# Patient Record
Sex: Female | Born: 1955 | Race: White | Hispanic: No | Smoking: Never smoker
Health system: Southern US, Community
[De-identification: ages and names within clinical notes are randomized; demographics above are authoritative.]

## PROBLEM LIST (undated history)

## (undated) DIAGNOSIS — I820 Budd-Chiari syndrome: Secondary | ICD-10-CM

## (undated) DIAGNOSIS — E7800 Pure hypercholesterolemia, unspecified: Secondary | ICD-10-CM

## (undated) DIAGNOSIS — I10 Essential (primary) hypertension: Secondary | ICD-10-CM

## (undated) DIAGNOSIS — Z9289 Personal history of other medical treatment: Secondary | ICD-10-CM

## (undated) DIAGNOSIS — F419 Anxiety disorder, unspecified: Secondary | ICD-10-CM

## (undated) HISTORY — DX: Anxiety disorder, unspecified: F41.9

## (undated) HISTORY — PX: DILATION AND CURETTAGE OF UTERUS: SHX78

## (undated) HISTORY — DX: Budd-Chiari syndrome: I82.0

## (undated) HISTORY — DX: Personal history of other medical treatment: Z92.89

---

## 1997-08-31 ENCOUNTER — Ambulatory Visit (HOSPITAL_COMMUNITY): Admission: RE | Admit: 1997-08-31 | Discharge: 1997-08-31 | Payer: Self-pay | Admitting: Internal Medicine

## 1997-12-14 ENCOUNTER — Ambulatory Visit (HOSPITAL_COMMUNITY): Admission: RE | Admit: 1997-12-14 | Discharge: 1997-12-14 | Payer: Self-pay | Admitting: *Deleted

## 2006-04-10 ENCOUNTER — Ambulatory Visit: Payer: Self-pay | Admitting: Cardiology

## 2006-04-10 LAB — CONVERTED CEMR LAB
AST: 33 units/L (ref 0–37)
Albumin: 3.7 g/dL (ref 3.5–5.2)
CO2: 29 meq/L (ref 19–32)
Creatinine, Ser: 0.6 mg/dL (ref 0.4–1.2)
Glucose, Bld: 80 mg/dL (ref 70–99)
HDL: 49.8 mg/dL (ref >39.0)
Sodium: 138 meq/L (ref 135–145)
TSH: 2.28 microintl units/mL (ref 0.35–5.50)
Total Bilirubin: 1.2 mg/dL (ref 0.3–1.2)
Total CHOL/HDL Ratio: 4.9
Triglycerides: 133 mg/dL (ref 0–149)
VLDL: 27 mg/dL (ref 0–40)

## 2006-04-24 ENCOUNTER — Ambulatory Visit: Payer: Self-pay | Admitting: Cardiology

## 2006-04-30 ENCOUNTER — Ambulatory Visit (HOSPITAL_COMMUNITY): Admission: RE | Admit: 2006-04-30 | Discharge: 2006-04-30 | Payer: Self-pay | Admitting: Obstetrics and Gynecology

## 2006-04-30 ENCOUNTER — Encounter (INDEPENDENT_AMBULATORY_CARE_PROVIDER_SITE_OTHER): Payer: Self-pay | Admitting: *Deleted

## 2006-05-25 ENCOUNTER — Ambulatory Visit: Payer: Self-pay | Admitting: Cardiology

## 2006-05-25 LAB — CONVERTED CEMR LAB
Bilirubin, Direct: 0.2 mg/dL (ref 0.0–0.3)
CO2: 29 meq/L (ref 19–32)
Cholesterol: 144 mg/dL (ref 0–200)
GFR calc Af Amer: 135 mL/min
GFR calc non Af Amer: 112 mL/min
Glucose, Bld: 89 mg/dL (ref 70–99)
HDL: 47.9 mg/dL (ref >39.0)
LDL Cholesterol: 81 mg/dL (ref 0–99)
Sodium: 140 meq/L (ref 135–145)
Total CHOL/HDL Ratio: 3
Total Protein: 6.7 g/dL (ref 6.0–8.3)
Triglycerides: 75 mg/dL (ref 0–149)

## 2006-07-03 ENCOUNTER — Ambulatory Visit: Payer: Self-pay | Admitting: Cardiology

## 2007-01-01 ENCOUNTER — Ambulatory Visit: Payer: Self-pay | Admitting: Cardiology

## 2007-02-09 ENCOUNTER — Ambulatory Visit: Payer: Self-pay | Admitting: Cardiology

## 2007-08-11 ENCOUNTER — Ambulatory Visit: Payer: Self-pay | Admitting: Cardiology

## 2007-11-22 ENCOUNTER — Emergency Department (HOSPITAL_COMMUNITY): Admission: EM | Admit: 2007-11-22 | Discharge: 2007-11-22 | Payer: Self-pay | Admitting: Family Medicine

## 2008-07-14 ENCOUNTER — Emergency Department (HOSPITAL_COMMUNITY): Admission: EM | Admit: 2008-07-14 | Discharge: 2008-07-14 | Payer: Self-pay | Admitting: Family Medicine

## 2008-08-12 DIAGNOSIS — E7800 Pure hypercholesterolemia, unspecified: Secondary | ICD-10-CM | POA: Insufficient documentation

## 2008-08-12 DIAGNOSIS — E669 Obesity, unspecified: Secondary | ICD-10-CM | POA: Insufficient documentation

## 2008-08-12 DIAGNOSIS — I10 Essential (primary) hypertension: Secondary | ICD-10-CM | POA: Insufficient documentation

## 2008-08-12 DIAGNOSIS — E785 Hyperlipidemia, unspecified: Secondary | ICD-10-CM | POA: Insufficient documentation

## 2010-07-30 NOTE — Assessment & Plan Note (Signed)
[facility] HEALTHCARE                            CARDIOLOGY OFFICE NOTE   CHANTIL, BARI                        MRN:          [id number]  DATE:08/11/2007                            DOB:          [birth year]    HISTORY OF PRESENT ILLNESS:  Ms. Sarkisyan returns today for follow-up  concerning:  1. Hypertension.  2. Mixed hyperlipidemia.  3. Obesity.   Her biggest complaint today is that she has not lost weight though she  has lost inches.  She has lost 2-3 sizes in her clothes.  She is working  out 3+ hours swimming and doing water aerobics at the [employer].  Her daughter actually directs aquatics there.   REVIEW OF SYSTEMS:  She has no angina.  No ischemic equivalents.  No dyspnea on exertion.  She has had no orthopnea, PND or peripheral edema.   MEDICATIONS:  1. Simvastatin 40 mg p.o. q.h.s. with her insurance plan.  2. Amlodipine/benazepril 5/20 mg daily.  3. Vitamin daily.   PHYSICAL EXAMINATION:  VITAL SIGNS:  Her weight is increased from 210 to  216 six months ago.  Her blood pressure is 122/80, her pulse is 68 and  regular.  HEENT:  Unchanged.  NECK:  Carotid upstrokes are equal bilaterally without bruits.  No JVD.  Thyroid is not enlarged.  Trachea is midline.  LUNGS:  Clear.  HEART:  Reveals a nondisplaced but poorly appreciated PMI.  Normal S1-  S2.  ABDOMEN:  Soft, good bowel sounds.  No obvious organomegaly.  EXTREMITIES:  No sinus clubbing or edema.  Pulses are brisk.  NEUROLOGICAL:  Exam is intact.   ASSESSMENT:  Ms. Villalona is doing well with her exercise.  Though her  weight is up, I suspect this is muscle mass.  She is down 2-3 sizes in  her clothes.  She is due lipids which we will arrange along with a  comprehensive metabolic panel.  Assuming these are stable, I will see  her back in 6 months.     Thomas C. Daleen Squibb, MD, [facility]  Electronically Signed   TCW/MedQ  DD: 08/11/2007  DT: 08/11/2007  Job #: [id number]   cc:   Juluis Mire, M.D.

## 2010-07-30 NOTE — Assessment & Plan Note (Signed)
[facility] HEALTHCARE                            CARDIOLOGY OFFICE NOTE   LARENDA, REEDY                          MRN:          [id number]  DATE:01/01/2007                            DOB:          03-14-1956    Ms. Graham returns today for further management of her hypertension,  obesity and hyperlipidemia.   Her daughter who she says is difficult has moved back in with her from  college.  Her lifestyle has changed including sharing the car.  She is  no longer going to [employer].  Her weight is back up to 213 from  204.  She is taking her medications and her blood pressure has been  good.   MEDICATIONS:  1. Vytorin 10/40 daily.  2. Amlodipine 5/benazepril 20 mg a day.   Her blood pressure is 118/70, her pulse 58 and regular.  EKG is normal  except for sinus brady.  Her weight is 213.  HEENT:  Unchanged.  Carotid upstrokes were equal bilaterally without  bruits, no JVD, thyroid is not enlarged, trachea is midline.  LUNGS:  Clear.  HEART:  Reveals a regular rate and rhythm.  ABDOMINAL:  Soft, good bowel sound, no midline bruit.  EXTREMITIES:  Reveal no edema, pulses are intact.   Ann Graham unfortunately has slipped with her weight loss program.  Her  blood pressure is still under good control.  Her lipids were at goal  May 25, 2006.   I have reinforced the fact that she needs to get back into a walking  program and to her diet program.  She will continue with her current  medications.  We will see her back in 6 months.     Thomas C. Daleen Squibb, MD, [facility]  Electronically Signed    TCW/MedQ  DD: 01/01/2007  DT: 01/02/2007  Job #: [id number]   cc:   Juluis Mire, Ann.D.

## 2010-07-30 NOTE — Assessment & Plan Note (Signed)
[facility] HEALTHCARE                            CARDIOLOGY OFFICE NOTE   Ann Graham, Ann Graham                          MRN:          [id number]  DATE:02/09/2007                            DOB:          03/15/1956    Ann Graham returns today for further management of the following issues:  1. Hypertension.  2. Mixed hyperlipidemia with an excellent response to Vytorin.  3. Obesity.   Unfortunately, her brother just died 2 weeks ago in [place].  Apparently there was some unresolved issues with him, she is very  emotional today.  Her daughter is also still living with her and sharing  her vehicle.  Her daughter called her twice on her cell phone while she  was in the office.  Her stress levels were obviously showing.   She is not in [employer].  She has lost 3 pounds.   MEDICATIONS:  1. Vytorin 10/40 daily.  2. Amlodipine 5 mg/benazepril 20 mg a day.   Her blood pressure is 125/77, pulse is 65 and regular, weight is 210.  HEENT:  Unchanged.  Carotid upstrokes were equal bilaterally without  bruits, no JVD, thyroid is not enlarged, trachea is midline.  LUNGS:  Clear.  HEART:  Reveals a nondisplaced PMI, normal S1-S2.  ABDOMINAL:  Soft, good bowel sounds.  EXTREMITIES:  Reveal 1+ edema, pulses are intact.  She has some early  petechiae and some early brawny changes to her skin.   We spent a lot of time talking about her lower extremity edema and the  longterm consequences of this, she thought she had cellulitis last week.  She obviously does not have cellulitis today.  I forewarned her that  with her weight and lack of activity these changes are going to get  worse.  I offered her a diuretic if her edema starts to become a real  problem.   I will plan on seeing her back March.  At that time she will be due  lipids and LFTs.     Thomas C. Daleen Squibb, MD, [facility]  Electronically Signed    TCW/MedQ  DD: 02/09/2007  DT: 02/09/2007  Job #: [id number]   cc:   Juluis Mire, M.D.

## 2010-08-02 NOTE — Assessment & Plan Note (Signed)
[facility] HEALTHCARE                            CARDIOLOGY OFFICE NOTE   [name], [name]                          MRN:          [id number]  DATE:04/10/2006                            DOB:          [birth year]    I was asked by Dr. Arelia Sneddon to see Ann Graham, a delightful 55 year old,  separated white female with multiple cardiac risk factors, including  severe hyperlipidemia and hypertension, as well as family history.   She has been to this office before, having had a stress Cardiolite,  February 21, 1999.  This was normal.  Her blood pressure was quite high  with exercise at 184 systolic.   She is totally asymptomatic, other than some mild dyspnea on exertion.   Her risk factors include hyperlipidemia with a total cholesterol greater  than 300 (I do not have any numbers), premature family history in her  father and in her brother and her mother, hypertension, obesity,  sedentary lifestyle.   She is separated and under a lot of stress.  She works full-time at [employer].  She is on her feet eight and a half hours a day and,  therefore, does not do any exercise.   PAST MEDICAL HISTORY:   ALLERGIES:  She is intolerant of HYDROCODONE.  She has no dye allergies.   CURRENT MEDICATIONS:  CQ10 vitamin, B complex and omega 3.   She does not smoke, does not use any recreational drugs, does not drink  any alcohol.  She drinks two cups of caffeinated beverages a day.   SURGICAL HISTORY:  None.   FAMILY HISTORY:  Mother died of a heart attack at age 49.  Her brother  is in his fifties and has had coronary bypass grafting.  High  cholesterol runs in the family.   SOCIAL HISTORY:  She is separated.  She has two children in college.  She is on Weight Watchers and lost 11 pounds recently.  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   REVIEW OF SYSTEMS:  Other than the HPI, is negative, except for  menstrual dysfunction.  All  systems were checked and questioned.   EXAM:  Her blood pressure is 140/90, her pulse is 51 and regular.  Her  EKG shows sinus brady with possible left atrial enlargement, otherwise  normal.  She is 5 feet 5, weighs 211.  HEENT:  Normocephalic, atraumatic.  PERRLA.  Extraocular movements  intact.  Sclerae are clear.  Facial symmetry is normal.  Dentition is  satisfactory.  Carotid upstrokes are equal bilaterally, without bruits.  No JVD.  Thyroid is not enlarged.  Trachea is midline.  The neck is  supple.  There is no lymphadenopathy.  LUNGS:  Clear.  HEART:  Reveals a nondisplaced PMI.  She has normal S1, S2.  ABDOMINAL EXAM:  Soft, good bowel sounds.  There is no organomegaly, but  difficult to assess.  EXTREMITIES:  Reveal no edema.  Pulses are intact.  NEUROLOGIC EXAM:  Intact.  SKIN:  Normal.  MUSCULOSKELETAL:  Unremarkable.   ASSESSMENT AND PLAN:  [name] has a number of cardiovascular risk  factors and, therefore, I think is at least moderate high risk of having  a premature coronary event.  This also pertains to her possible risk of  stroke.   We have spoken for 20-30 minutes today.  We need fasting blood work,  which I will obtain today.  We will check lipids, comprehensive  metabolic panel, a TSH.  In addition, we need to also talk about  checking her blood pressure when she returns.   PLAN:  1. Check blood work today.  2. Return in a week to ten days to discuss pharmacological therapy for      both her blood pressure and her hyperlipidemia.     Thomas C. Daleen Squibb, MD, [facility]  Electronically Signed    TCW/MedQ  DD: 04/10/2006  DT: 04/10/2006  Job #: [id number]   cc:   Juluis Mire, M.D.

## 2010-08-02 NOTE — Op Note (Signed)
NAME:  Ann Graham, Ann Graham NO.:  [id number]   MEDICAL RECORD NO.:  [id number]          PATIENT TYPE:  AMB   LOCATION:  SDC                           FACILITY:  [facility]   PHYSICIAN:  Juluis Mire, M.D.   DATE OF BIRTH:  11-04-1955   DATE OF PROCEDURE:  04/30/2006  DATE OF DISCHARGE:                               OPERATIVE REPORT   POSTOPERATIVE DIAGNOSIS:  Endometrial polyp.   POSTOPERATIVE DIAGNOSIS:  Endometrial polyp.   PROCEDURES:  1. Hysteroscopy.  2. Resection of polyp.  3. Multiple endometrial biopsies and endometrial curettings.   SURGEON:  Juluis Mire, M.D.   ANESTHESIA:  General.   ESTIMATED BLOOD LOSS:  Minimal.   PACKS AND DRAINS:  None.   INTRAOPERATIVE BLOOD PLACED:  None.   COMPLICATIONS:  None.   INDICATIONS:  As dictated in history and physical.   DESCRIPTION OF PROCEDURE:  Patient was taken to the OR, placed in supine  position.  After satisfactory level of general anesthesia was obtained,  the patient was placed in dorsal lithotomy position using the Allen  stirrups.  Perineum and vagina prepped out with Betadine and draped as a  sterile field.  Speculum was placed in the vaginal vault.  The cervix  was grasped with single-tooth tenaculum.  Paracervical block was  instituted using 2% Nesacaine.  Uterus sounded to 8 cm.  Cervix serially  dilated to a size 35 Pratt dilator.  Operative hysteroscope was  introduced and intrauterine cavity was distended using sorbitol.  Visualization revealed a polyp on the right lateral uterine wall.  This  was resected and sent for pathological review.  We then did subsequent  multiple biopsies as well as curettings.  There were no signs of  perforation, deficit was minimal, bleeding was minimal.  At this point  in time, the speculum  and single-tooth tenaculum then removed.  The patient taken out of the  dorsal lithotomy position.  Once alert, transferred to recovery room in  good condition.  Sponges,  instrument and needle count was reported as  correct by the circulating nurse x2.      Juluis Mire, M.D.  Electronically Signed     JSM/MEDQ  D:  04/30/2006  T:  04/30/2006  Job:  [id number]

## 2010-08-02 NOTE — H&P (Signed)
NAME:  Ann Graham, AFZAL NO.:  [id number]   MEDICAL RECORD NO.:  [id number]          PATIENT TYPE:  AMB   LOCATION:  SDC                           [facility]ACILITY:  [facility]   PHYSICIAN:  Juluis Mire, M.D.   DATE OF BIRTH:  [birth year]   DATE OF ADMISSION:  04/30/2006  DATE OF DISCHARGE:                              HISTORY & PHYSICAL   HISTORY OF PRESENT ILLNESS:  This 55 year old gravida 2, para 2 female  presents for hysteroscopy.   In relation to the present admission, the patient has had a history of  oligomenorrhea.  She underwent a saline infusion ultrasound that  revealed an endometrial polyp and thickening; she now presents for  hysteroscopic evaluation to rule out any type of endometrial process  such as hyperplasia.   ALLERGIES:  She allergic to HYDROCODONE.   MEDICATIONS:  None.   PAST MEDICAL HISTORY:  Usual childhood disease without any significant  sequelae.   SURGICAL HISTORY:  None.   OBSTETRICAL HISTORY:  Two vaginal deliveries.   FAMILY HISTORY:  Noncontributory.   SOCIAL HISTORY:  No tobacco or alcohol use.   REVIEW OF SYSTEMS:  Noncontributory.   PHYSICAL EXAM:  GENERAL:  The patient is afebrile with stable vital  signs.  HEENT: The patient is normocephalic.  Pupils are equal, round and  reactive to light and accommodation.  Extraocular were intact.  Sclerae  and conjunctivae were clear.  Oropharynx clear.  NECK:  Without thyromegaly.  BREASTS:  Not examined.  LUNGS:  Clear.  CARDIOVASCULAR:  Regular rhythm and rate without murmurs, rubs or  gallops.  ABDOMEN:  Exam is benign.  PELVIC: Normal external genitalia.  Vaginal mucosa is clear.  Cervix  remarkable.  Uterus normal size, shape and contour.  Adnexa free of  masses or tenderness.  EXTREMITIES:  Trace edema.  NEUROLOGIC:  Exam grossly within normal limits.   IMPRESSION:  Abnormal uterine bleeding with endometrial polyp.   PLAN:  The patient will undergo hysteroscopy with  resectoscope. Risks of  surgery have been discussed including the risk of infection, the risk of  hemorrhage that could require transfusion or possible hysterectomy, risk  of injury to adjacent organs including bladder, bowel or ureters that  could require further exploratory surgery, risk of deep venous  thrombosis and pulmonary embolus.  The patient expressed understanding  of indications and risks.      Juluis Mire, M.D.  Electronically Signed     JSM/MEDQ  D:  04/30/2006  T:  04/30/2006  Job:  [id number]

## 2010-08-02 NOTE — Assessment & Plan Note (Signed)
[facility] HEALTHCARE                            CARDIOLOGY OFFICE NOTE   Ann, Graham                          MRN:          [id number]  DATE:07/03/2006                            DOB:          [birth year]    Ann Graham returns today to discuss the findings of her blood work and  follow up on her blood pressure.   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   On Vytorin 10/40 her total cholesterol dropped from 246 to 144,  triglycerides are down to 75, HDL stays about the same at 48, LDL is 81  down from 176, LFTs are normal, potassium and electrolytes were normal.   Her blood pressure on amlodipine, Benazepril 5/20 has been remarkably  good; it is 128/70.  She is watching her salt as well.   The rest of her exam is unchanged.   I have spent 20 minutes reinforcing to Ann Graham how well she is doing.  We will stay with the same program.  I will plan on seeing her back  again in 6 months.     Thomas C. Daleen Squibb, MD, [facility]  Electronically Signed    TCW/MedQ  DD: 07/03/2006  DT: 07/04/2006  Job #: [id number]   cc:   Juluis Mire, M.D.

## 2010-08-02 NOTE — Assessment & Plan Note (Signed)
[facility] HEALTHCARE                            CARDIOLOGY OFFICE NOTE   SHARNE, LINDERS                          MRN:          [id number]  DATE:04/24/2006                            DOB:          Sep 12, 1955    [name] returns today to discuss the findings of her blood work, as  well as follow up on her blood pressure.   Unfortunately, despite losing 6 pounds this past week, her blood  pressure is still elevated at 174/87.  Her pulse is 66 and regular.   Her laboratory data showed a potassium of 3.4, though she is not on any  diuretics.  The rest of her chemistry profile was normal.  Her total  cholesterol is 246, triglycerides 133, HDL 49.8, direct LDL is 176, TSH  was normal.   ASSESSMENT AND PLAN:  I have had a long talk with Ann Graham today.  With  her family history, fairly severe hyperlipidemia, and hypertension, we  need to be very aggressive with pharmacological therapy, as well as with  therapeutic lifestyle changes.   PLAN:  1. Begin exercise program three hours per week.  2. Continue to lose weight, about 4 pounds per month.  3. Begin Vytorin 10/40 with followup blood work in 6 weeks.  4. Begin amlodipine 5/benazepril 20 q.a.m.  5. Increase potassium-rich foods in her diet.   I will plan on seeing her back in about 8 weeks.     Thomas C. Daleen Squibb, MD, [facility]  Electronically Signed    TCW/MedQ  DD: 04/24/2006  DT: 04/24/2006  Job #: [id number]   cc:   Ann Graham, M.D.

## 2010-11-29 ENCOUNTER — Other Ambulatory Visit: Payer: Self-pay | Admitting: Surgery

## 2010-12-20 DIAGNOSIS — H4710 Unspecified papilledema: Secondary | ICD-10-CM | POA: Insufficient documentation

## 2011-02-10 DIAGNOSIS — G950 Syringomyelia and syringobulbia: Secondary | ICD-10-CM | POA: Insufficient documentation

## 2011-02-20 DIAGNOSIS — IMO0002 Reserved for concepts with insufficient information to code with codable children: Secondary | ICD-10-CM | POA: Insufficient documentation

## 2011-02-20 DIAGNOSIS — E236 Other disorders of pituitary gland: Secondary | ICD-10-CM | POA: Insufficient documentation

## 2011-09-03 DIAGNOSIS — G932 Benign intracranial hypertension: Secondary | ICD-10-CM | POA: Insufficient documentation

## 2012-06-12 ENCOUNTER — Inpatient Hospital Stay (HOSPITAL_COMMUNITY)
Admission: EM | Admit: 2012-06-12 | Discharge: 2012-06-14 | DRG: 143 | Disposition: A | Discharge status: Home / Self Care | Payer: BC Managed Care – PPO | Attending: Family Medicine | Admitting: Family Medicine

## 2012-06-12 ENCOUNTER — Encounter (HOSPITAL_COMMUNITY): Payer: Self-pay | Admitting: Emergency Medicine

## 2012-06-12 ENCOUNTER — Emergency Department (HOSPITAL_COMMUNITY): Payer: BC Managed Care – PPO

## 2012-06-12 DIAGNOSIS — E876 Hypokalemia: Secondary | ICD-10-CM

## 2012-06-12 DIAGNOSIS — I517 Cardiomegaly: Secondary | ICD-10-CM

## 2012-06-12 DIAGNOSIS — Z7982 Long term (current) use of aspirin: Secondary | ICD-10-CM

## 2012-06-12 DIAGNOSIS — Z79899 Other long term (current) drug therapy: Secondary | ICD-10-CM

## 2012-06-12 DIAGNOSIS — J811 Chronic pulmonary edema: Secondary | ICD-10-CM | POA: Diagnosis present

## 2012-06-12 DIAGNOSIS — I10 Essential (primary) hypertension: Secondary | ICD-10-CM

## 2012-06-12 DIAGNOSIS — I5189 Other ill-defined heart diseases: Secondary | ICD-10-CM

## 2012-06-12 DIAGNOSIS — E785 Hyperlipidemia, unspecified: Secondary | ICD-10-CM

## 2012-06-12 DIAGNOSIS — E669 Obesity, unspecified: Secondary | ICD-10-CM

## 2012-06-12 DIAGNOSIS — E7800 Pure hypercholesterolemia, unspecified: Secondary | ICD-10-CM | POA: Diagnosis present

## 2012-06-12 DIAGNOSIS — E875 Hyperkalemia: Secondary | ICD-10-CM | POA: Diagnosis present

## 2012-06-12 DIAGNOSIS — R079 Chest pain, unspecified: Secondary | ICD-10-CM

## 2012-06-12 HISTORY — DX: Essential (primary) hypertension: I10

## 2012-06-12 HISTORY — DX: Pure hypercholesterolemia, unspecified: E78.00

## 2012-06-12 LAB — CBC WITH DIFFERENTIAL/PLATELET
Basophils Absolute: 0 K/uL (ref 0.0–0.1)
Basophils Relative: 0 % (ref 0–1)
Eosinophils Absolute: 0.1 K/uL (ref 0.0–0.7)
Eosinophils Relative: 2 % (ref 0–5)
HCT: 37.2 % (ref 36.0–46.0)
Hemoglobin: 13.1 g/dL (ref 12.0–15.0)
Lymphocytes Relative: 20 % (ref 12–46)
Lymphs Abs: 1.9 K/uL (ref 0.7–4.0)
MCH: 28.8 pg (ref 26.0–34.0)
MCHC: 35.2 g/dL (ref 30.0–36.0)
MCV: 81.8 fL (ref 78.0–100.0)
Monocytes Absolute: 0.7 K/uL (ref 0.1–1.0)
Monocytes Relative: 7 % (ref 3–12)
Neutro Abs: 6.6 K/uL (ref 1.7–7.7)
Neutrophils Relative %: 71 % (ref 43–77)
Platelets: 238 K/uL (ref 150–400)
RBC: 4.55 MIL/uL (ref 3.87–5.11)
RDW: 13.6 % (ref 11.5–15.5)
WBC: 9.3 K/uL (ref 4.0–10.5)

## 2012-06-12 LAB — BASIC METABOLIC PANEL WITH GFR
BUN: 22 mg/dL (ref 6–23)
CO2: 26 meq/L (ref 19–32)
Calcium: 9.4 mg/dL (ref 8.4–10.5)
Chloride: 94 meq/L — ABNORMAL LOW (ref 96–112)
Creatinine, Ser: 0.66 mg/dL (ref 0.50–1.10)
GFR calc Af Amer: >90 mL/min
GFR calc non Af Amer: >90 mL/min
Glucose, Bld: 114 mg/dL — ABNORMAL HIGH (ref 70–99)
Potassium: 3.1 meq/L — ABNORMAL LOW (ref 3.5–5.1)
Sodium: 132 meq/L — ABNORMAL LOW (ref 135–145)

## 2012-06-12 LAB — TROPONIN I: Troponin I: <0.3 ng/mL

## 2012-06-12 NOTE — ED Provider Notes (Signed)
History     CSN: [id number]  Arrival date & time 06/12/12  2133   First MD Initiated Contact with Patient 06/12/12 2308      Chief Complaint  Patient presents with  . Chest Pain    (Consider location/radiation/quality/duration/timing/severity/associated sxs/prior treatment) HPI 57 year old female presents emergency apartment via EMS with complaint of chest pressure.  Patient reports pain.  This evening when she was laying down to sleep.  Pain was sharp, described as heavy.  She had some pain yesterday, but much worse tonight.  She denies any shortness of breath, fever, cough.  Patient has history of hypertension, and hyperlipidemia.  She reports family history of coronary disease.  Patient was recently put on Lasix by the nurse practitioner at her doctor's office 2 to lower extremity swelling.  She, reports she's had edema in her lower extremity for some time, worse of the last week.  Patient has received 324 mg of aspirin prior to arrival.  EMS gave patient a nitroglycerin, which dropped her blood pressure precipitously.  Patient denies any pain at present.  Portable chest x-ray with concerns for possible pericardial effusion.  She denies any previous history of pericardial effusion.  She denies any recent illnesses. No history of cancer.  Past Medical History  Diagnosis Date  . Hypertension   . Elevated cholesterol     No past surgical history on file.  No family history on file.  History  Substance Use Topics  . Smoking status: Not on file  . Smokeless tobacco: Not on file  . Alcohol Use: Not on file    OB History   No data available      Review of Systems  See History of Present Illness; otherwise all other systems are reviewed and negative  Allergies  Hydrocodone  Home Medications   Current Outpatient Rx  Name  Route  Sig  Dispense  Refill  . aspirin 325 MG tablet   Oral   Take 325 mg by mouth once.         . Cyanocobalamin (VITAMIN B-12) 500 MCG SUBL  Sublingual   Place 500 mcg under the tongue daily.         . furosemide (LASIX) 40 MG tablet   Oral   Take 20 mg by mouth daily as needed (for fluid retention).         . traZODone (DESYREL) 50 MG tablet   Oral   Take 50 mg by mouth at bedtime.         . triamterene-hydrochlorothiazide (MAXZIDE-25) 37.5-25 MG per tablet   Oral   Take 1 tablet by mouth daily.           BP 106/64  Pulse 73  Temp(Src) 98.1 F (36.7 C) (Oral)  Resp 13  SpO2 98%  Physical Exam  Nursing note and vitals reviewed. Constitutional: She is oriented to person, place, and time. She appears well-developed and well-nourished.  HENT:  Head: Normocephalic and atraumatic.  Nose: Nose normal.  Mouth/Throat: Oropharynx is clear and moist.  Eyes: Conjunctivae and EOM are normal. Pupils are equal, round, and reactive to light.  Neck: Normal range of motion. Neck supple. No JVD present. No tracheal deviation present. No thyromegaly present.  Cardiovascular: Normal rate, regular rhythm, normal heart sounds and intact distal pulses.  Exam reveals no gallop and no friction rub.   No murmur heard. Pulmonary/Chest: Effort normal and breath sounds normal. No stridor. No respiratory distress. She has no wheezes. She has no rales. She  exhibits no tenderness.  Abdominal: Soft. Bowel sounds are normal. She exhibits no distension and no mass. There is no tenderness. There is no rebound and no guarding.  Musculoskeletal: Normal range of motion. She exhibits no edema and no tenderness.  Lymphadenopathy:    She has no cervical adenopathy.  Neurological: She is alert and oriented to person, place, and time. She exhibits normal muscle tone. Coordination normal.  Skin: Skin is warm and dry. No rash noted. No erythema. No pallor.  Psychiatric: She has a normal mood and affect. Her behavior is normal. Judgment and thought content normal.    ED Course  Procedures (including critical care time)  Labs Reviewed  BASIC  METABOLIC PANEL - Abnormal; Notable for the following:    Sodium 132 (*)    Potassium 3.1 (*)    Chloride 94 (*)    Glucose, Bld 114 (*)    All other components within normal limits  CBC WITH DIFFERENTIAL  TROPONIN I   Dg Chest 2 View  06/12/2012  *RADIOLOGY REPORT*  Clinical Data: Chest pain, shortness of breath, history hypertension  CHEST - 2 VIEW  Comparison: Prior portable exam of 06/12/2012  Findings: Mild enlargement of cardiac silhouette. Mediastinal contours and pulmonary vascularity normal. Minimal atelectasis at right base. Lungs otherwise clear. No pleural effusion or pneumothorax. Bones unremarkable.  IMPRESSION: Mild enlargement of cardiac silhouette. Minimal right basilar atelectasis.   Original Report Authenticated By: Ulyses Southward, M.D.    Dg Chest Port 1 View  06/12/2012  *RADIOLOGY REPORT*  Clinical Data: Chest pain and shortness of breath.  PORTABLE CHEST - 1 VIEW  Comparison: No priors.  Findings: Lung volumes are slightly low.  No consolidative airspace disease.  No pleural effusions.  Pulmonary vasculature is within normal limits.  Enlargement of the cardiopericardial silhouette has a "water bottle" appearance, which could suggest the presence of a pericardial effusion.  Upper mediastinal contours are slightly distorted by patient rotation to the right.  IMPRESSION: 1.  Enlargement of the cardiopericardial silhouette with a "water bottle" appearance, which could suggest the presence of a pericardial effusion.  Clinical correlation is recommended.   Original Report Authenticated By: Trudie Reed, M.D.      1. Chest pain   2. Hypokalemia   3. Unspecified essential hypertension   4. Cardiomegaly       MDM  57 year old female with chest pressure, chest x-ray concerning for possible pericardial effusion.  We'll get 2 view chest for better evaluation, may need echo and/or CT chest.  Patient is been seen in the remote past by cardiology.  Given her risk factors, so she will  need a chest pain, rule out admission.        Olivia Mackie, MD 06/13/12 [phone]

## 2012-06-12 NOTE — ED Notes (Signed)
[facility] presents with a 57 yo female temporarily residing at hotel with chest pain.  Pt rates pain 8/10.  Pt was in an irregular SR and was given NTG by [facility]/HR went from 82 to 50 with a BP of 80/40.  [facility] laid pt down and bolused 100 ml of NS and BP returned to 110/60 and HR remained stable at 74.Pt recently put on furosemide and trazadone.  Pt received 324 ASA at residence.

## 2012-06-13 ENCOUNTER — Encounter (HOSPITAL_COMMUNITY): Payer: Self-pay | Admitting: Internal Medicine

## 2012-06-13 DIAGNOSIS — E876 Hypokalemia: Secondary | ICD-10-CM | POA: Diagnosis present

## 2012-06-13 DIAGNOSIS — I517 Cardiomegaly: Secondary | ICD-10-CM | POA: Diagnosis present

## 2012-06-13 DIAGNOSIS — I10 Essential (primary) hypertension: Secondary | ICD-10-CM

## 2012-06-13 DIAGNOSIS — I519 Heart disease, unspecified: Secondary | ICD-10-CM

## 2012-06-13 DIAGNOSIS — R079 Chest pain, unspecified: Secondary | ICD-10-CM | POA: Diagnosis present

## 2012-06-13 LAB — CBC
HCT: 38.2 % (ref 36.0–46.0)
MCV: 81.8 fL (ref 78.0–100.0)
Platelets: 230 10*3/uL (ref 150–400)
RBC: 4.67 MIL/uL (ref 3.87–5.11)
WBC: 7.6 10*3/uL (ref 4.0–10.5)

## 2012-06-13 LAB — TROPONIN I
Troponin I: <0.3 ng/mL (ref <0.30)
Troponin I: <0.3 ng/mL (ref <0.30)

## 2012-06-13 LAB — COMPREHENSIVE METABOLIC PANEL
Albumin: 3.6 g/dL (ref 3.5–5.2)
BUN: 18 mg/dL (ref 6–23)
Calcium: 9.5 mg/dL (ref 8.4–10.5)
Creatinine, Ser: 0.56 mg/dL (ref 0.50–1.10)
GFR calc Af Amer: >90 mL/min (ref >90)
Total Protein: 7.1 g/dL (ref 6.0–8.3)

## 2012-06-13 LAB — LIPID PANEL
LDL Cholesterol: 187 mg/dL — ABNORMAL HIGH (ref 0–99)
Total CHOL/HDL Ratio: 5.8 RATIO
VLDL: 40 mg/dL (ref 0–40)

## 2012-06-13 LAB — PHOSPHORUS: Phosphorus: 4.5 mg/dL (ref 2.3–4.6)

## 2012-06-13 LAB — MAGNESIUM: Magnesium: 2.4 mg/dL (ref 1.5–2.5)

## 2012-06-13 LAB — HEMOGLOBIN A1C: Hgb A1c MFr Bld: 5.7 % — ABNORMAL HIGH (ref <5.7)

## 2012-06-13 MED ORDER — POTASSIUM CHLORIDE CRYS ER 20 MEQ PO TBCR
EXTENDED_RELEASE_TABLET | ORAL | Status: AC
  Filled 2012-06-13: qty 2

## 2012-06-13 MED ORDER — FUROSEMIDE 20 MG PO TABS
20.0000 mg | ORAL_TABLET | Freq: Every day | ORAL | Status: DC
  Administered 2012-06-13 – 2012-06-14 (×2): 20 mg via ORAL
  Filled 2012-06-13 (×2): qty 1

## 2012-06-13 MED ORDER — ACTIVE PARTNERSHIP FOR HEALTH OF YOUR HEART BOOK
Freq: Once | Status: AC
  Administered 2012-06-13: 22:00:00
  Filled 2012-06-13 (×2): qty 1

## 2012-06-13 MED ORDER — DOCUSATE SODIUM 100 MG PO CAPS
100.0000 mg | ORAL_CAPSULE | Freq: Two times a day (BID) | ORAL | Status: DC
  Administered 2012-06-14: 100 mg via ORAL
  Filled 2012-06-13 (×2): qty 1

## 2012-06-13 MED ORDER — ONDANSETRON HCL 4 MG PO TABS: 4.0000 mg | ORAL_TABLET | Freq: Four times a day (QID) | ORAL | Status: DC | PRN

## 2012-06-13 MED ORDER — SODIUM CHLORIDE 0.9 % IJ SOLN
3.0000 mL | Freq: Two times a day (BID) | INTRAMUSCULAR | Status: DC
  Administered 2012-06-13 – 2012-06-14 (×2): 3 mL via INTRAVENOUS

## 2012-06-13 MED ORDER — POTASSIUM CHLORIDE 10 MEQ/100ML IV SOLN
10.0000 meq | INTRAVENOUS | Status: DC
  Administered 2012-06-13: 10 meq via INTRAVENOUS
  Filled 2012-06-13 (×2): qty 100

## 2012-06-13 MED ORDER — MORPHINE SULFATE 2 MG/ML IJ SOLN: 2.0000 mg | INTRAMUSCULAR | Status: DC | PRN

## 2012-06-13 MED ORDER — POTASSIUM CHLORIDE CRYS ER 20 MEQ PO TBCR
40.0000 meq | EXTENDED_RELEASE_TABLET | Freq: Once | ORAL | Status: AC
  Administered 2012-06-13: 40 meq via ORAL
  Filled 2012-06-13: qty 2

## 2012-06-13 MED ORDER — ATORVASTATIN CALCIUM 20 MG PO TABS
20.0000 mg | ORAL_TABLET | Freq: Every day | ORAL | Status: DC
  Administered 2012-06-13: 20 mg via ORAL
  Filled 2012-06-13 (×2): qty 1

## 2012-06-13 MED ORDER — ACETAMINOPHEN 325 MG PO TABS: 650.0000 mg | ORAL_TABLET | Freq: Four times a day (QID) | ORAL | Status: DC | PRN

## 2012-06-13 MED ORDER — ACETAMINOPHEN 650 MG RE SUPP: 650.0000 mg | Freq: Four times a day (QID) | RECTAL | Status: DC | PRN

## 2012-06-13 MED ORDER — ONDANSETRON HCL 4 MG/2ML IJ SOLN: 4.0000 mg | Freq: Four times a day (QID) | INTRAMUSCULAR | Status: DC | PRN

## 2012-06-13 MED ORDER — ASPIRIN EC 81 MG PO TBEC
81.0000 mg | DELAYED_RELEASE_TABLET | Freq: Every day | ORAL | Status: DC
  Administered 2012-06-13 – 2012-06-14 (×2): 81 mg via ORAL
  Filled 2012-06-13 (×2): qty 1

## 2012-06-13 MED ORDER — TRAZODONE HCL 50 MG PO TABS
50.0000 mg | ORAL_TABLET | Freq: Every day | ORAL | Status: DC
  Administered 2012-06-13: 50 mg via ORAL
  Filled 2012-06-13 (×2): qty 1

## 2012-06-13 MED ORDER — POTASSIUM CHLORIDE CRYS ER 20 MEQ PO TBCR
40.0000 meq | EXTENDED_RELEASE_TABLET | Freq: Once | ORAL | Status: AC
  Administered 2012-06-13: 40 meq via ORAL

## 2012-06-13 MED ORDER — ENOXAPARIN SODIUM 40 MG/0.4ML ~~LOC~~ SOLN
40.0000 mg | SUBCUTANEOUS | Status: DC
  Filled 2012-06-13 (×2): qty 0.4

## 2012-06-13 MED ORDER — ASPIRIN 325 MG PO TABS: 325.0000 mg | ORAL_TABLET | Freq: Once | ORAL | Status: DC

## 2012-06-13 NOTE — Progress Notes (Signed)
  Echocardiogram 2D Echocardiogram has been performed.  [place] 06/13/2012, 5:53 PM

## 2012-06-13 NOTE — Progress Notes (Signed)
[facility] PROGRESS NOTE  Ann Graham NWG:[id number] DOB: 1955/06/17 DOA: 06/12/2012 PCP: Thayer Headings, MD  Assessment/Plan: 1. Chest pain- cardiac enzymes x 3 are negative. 2d echo results are pending. Continue aspirin 325 mg po daily. 2. Mild pulmonary edema- continue po lasix 20 mg daily 3. Hyperlipidemia LDL is 187, will start Lipitor 20 mg po daily. 4. Hypokalemia- potassium replaced. 5. Hypertension- BP stable, Triamterene/HCTZ has been stopped and she is started on lasix 20 mg po daily.  Code Status: Full code Family Communication: discussed with patient and her son in law at bedside Disposition Plan: Home when stable   Consultants:  None  Procedures:  echo  Antibiotics:  none  HPI/Subjective: Patient seen , admitted with chest pain and CXR showing cardiomegaly.2D echo is pending.  Objective: Filed Vitals:   06/13/12 0115 06/13/12 0215 06/13/12 0230 06/13/12 0436  BP: 114/62 113/66 109/63 129/79  Pulse: 68 66 62 64  Temp:    97.5 F (36.4 C)  TempSrc:    Oral  Resp: 20 17 17    Height:    5\' 4"  (1.626 m)  Weight:    98.9 kg (218 lb 0.6 oz)  SpO2: 97% 98% 96% 100%   No intake or output data in the 24 hours ending 06/13/12 1315 Filed Weights   06/13/12 0436  Weight: 98.9 kg (218 lb 0.6 oz)    Exam:   General:  Appear in no acute distress  Cardiovascular: s1s2 rrr, no murmurs  Respiratory: Clear bilaterally, no wheezing  Abdomen: Soft, nontender, no organomegaly  Extremities:  No clubbing, no edema   Data Reviewed: Basic Metabolic Panel:  Recent Labs Lab 06/12/12 2252 06/13/12 0735  NA 132* 137  K 3.1* 3.8  CL 94* 101  CO2 26 27  GLUCOSE 114* 101*  BUN 22 18  CREATININE 0.66 0.56  CALCIUM 9.4 9.5  MG  --  2.4  PHOS  --  4.5   Liver Function Tests:  Recent Labs Lab 06/13/12 0735  AST 20  ALT 24  ALKPHOS 88  BILITOT 0.8  PROT 7.1  ALBUMIN 3.6   No results found for this basename: LIPASE, AMYLASE,  in the last  168 hours No results found for this basename: AMMONIA,  in the last 168 hours CBC:  Recent Labs Lab 06/12/12 2252 06/13/12 0735  WBC 9.3 7.6  NEUTROABS 6.6  --   HGB 13.1 13.3  HCT 37.2 38.2  MCV 81.8 81.8  PLT 238 230   Cardiac Enzymes:  Recent Labs Lab 06/12/12 2252 06/13/12 0735 06/13/12 1012  TROPONINI <0.30 <0.30 <0.30   BNP (last 3 results) No results found for this basename: PROBNP,  in the last 8760 hours CBG: No results found for this basename: GLUCAP,  in the last 168 hours  No results found for this or any previous visit (from the past 240 hour(s)).   Studies: Dg Chest 2 View  06/12/2012  *RADIOLOGY REPORT*  Clinical Data: Chest pain, shortness of breath, history hypertension  CHEST - 2 VIEW  Comparison: Prior portable exam of 06/12/2012  Findings: Mild enlargement of cardiac silhouette. Mediastinal contours and pulmonary vascularity normal. Minimal atelectasis at right base. Lungs otherwise clear. No pleural effusion or pneumothorax. Bones unremarkable.  IMPRESSION: Mild enlargement of cardiac silhouette. Minimal right basilar atelectasis.   Original Report Authenticated By: Ulyses Southward, M.D.    Dg Chest Port 1 View  06/12/2012  *RADIOLOGY REPORT*  Clinical Data: Chest pain and shortness of breath.  PORTABLE CHEST -  1 VIEW  Comparison: No priors.  Findings: Lung volumes are slightly low.  No consolidative airspace disease.  No pleural effusions.  Pulmonary vasculature is within normal limits.  Enlargement of the cardiopericardial silhouette has a "water bottle" appearance, which could suggest the presence of a pericardial effusion.  Upper mediastinal contours are slightly distorted by patient rotation to the right.  IMPRESSION: 1.  Enlargement of the cardiopericardial silhouette with a "water bottle" appearance, which could suggest the presence of a pericardial effusion.  Clinical correlation is recommended.   Original Report Authenticated By: Trudie Reed, M.D.      Scheduled Meds: . active partnership for health of your heart book   Does not apply Once  . aspirin EC  81 mg Oral Daily  . aspirin  325 mg Oral Once  . docusate sodium  100 mg Oral BID  . enoxaparin (LOVENOX) injection  40 mg Subcutaneous Q24H  . furosemide  20 mg Oral Daily  . potassium chloride SA      . sodium chloride  3 mL Intravenous Q12H  . traZODone  50 mg Oral QHS   Continuous Infusions:   Active Problems:   HYPERLIPIDEMIA   HYPERTENSION   Chest pain at rest   Hypokalemia   Cardiomegaly    Time spent: 30 min    [facility]  [facility] Pager [phone]. If 7PM-7AM, please contact night-coverage at [email or web address], password [facility] 06/13/2012, 1:15 PM  LOS: 1 day

## 2012-06-13 NOTE — H&P (Signed)
PCP:   Thayer Headings, MD  [facility]    Chief Complaint:   Chest pain  HPI: Ann Graham is a 57 y.o. female   has a past medical history of Hypertension and Elevated cholesterol.   Presented with  1 week hx of bilateral leg swelling her PCP started her on lasix 4 days ago since then her swelling has improved. She denies any shortness of breath but can hear wheezing at times. She swims at least twice a week but never has any chest pain or shortness of breath with that. Her mother and brother died before 24 from CAD. She has no hx of CAD her self. She thinks her last stress test was 6 years ago and was unremarkable.  Earlier today she was trying to go to bed but started to have heaviness in her chest radiating to her back and she could not get comfortable. This sensation lasted for few hours but now resolved. She got aspirin 325 and nitro SL x1. Not sure what that the pain better.   Review of Systems:    Pertinent positives include: Bilateral lower extremity swelling   Constitutional:  No weight loss, night sweats, Fevers, chills, fatigue, weight loss  HEENT:  No headaches, Difficulty swallowing,Tooth/dental problems,Sore throat,  No sneezing, itching, ear ache, nasal congestion, post nasal drip,  Cardio-vascular:  No chest pain, Orthopnea, PND, anasarca, dizziness, palpitations.no  GI:  No heartburn, indigestion, abdominal pain, nausea, vomiting, diarrhea, change in bowel habits, loss of appetite, melena, blood in stool, hematemesis Resp:  no shortness of breath at rest. No dyspnea on exertion, No excess mucus, no productive cough, No non-productive cough, No coughing up of blood.No change in color of mucus.No wheezing. Skin:  no rash or lesions. No jaundice GU:  no dysuria, change in color of urine, no urgency or frequency. No straining to urinate.  No flank pain.  Musculoskeletal:  No joint pain or no joint swelling. No decreased range of motion. No back pain.   Psych:  No change in mood or affect. No depression or anxiety. No memory loss.  Neuro: no localizing neurological complaints, no tingling, no weakness, no double vision, no gait abnormality, no slurred speech, no confusion  Otherwise ROS are negative except for above, 10 systems were reviewed  Past Medical History: Past Medical History  Diagnosis Date  . Hypertension   . Elevated cholesterol    History reviewed. No pertinent past surgical history.   Medications: Prior to Admission medications   Medication Sig Start Date End Date Taking? Authorizing Provider  aspirin 325 MG tablet Take 325 mg by mouth once.   Yes Historical Provider, MD  Cyanocobalamin (VITAMIN B-12) 500 MCG SUBL Place 500 mcg under the tongue daily.   Yes Historical Provider, MD  furosemide (LASIX) 40 MG tablet Take 20 mg by mouth daily as needed (for fluid retention).   Yes Historical Provider, MD  traZODone (DESYREL) 50 MG tablet Take 50 mg by mouth at bedtime.   Yes Historical Provider, MD  triamterene-hydrochlorothiazide (MAXZIDE-25) 37.5-25 MG per tablet Take 1 tablet by mouth daily.   Yes Historical Provider, MD    Allergies:   Allergies  Allergen Reactions  . Hydrocodone Hives    Social History:  Ambulatory  independently   Lives at  Home alone   reports that she has never smoked. She does not have any smokeless tobacco history on file. She reports that she does not drink alcohol or use illicit drugs.   Family History:  family history includes Colon cancer in her father and Heart disease in her brother, father, and mother.    Physical Exam: Patient Vitals for the past 24 hrs:  BP Temp Temp src Pulse Resp SpO2  06/13/12 0115 114/62 mmHg - - 68 20 97 %  06/13/12 0109 130/73 mmHg - - 73 - -  06/13/12 0108 124/81 mmHg - - 75 - -  06/13/12 0106 123/70 mmHg - - 73 - -  06/12/12 2315 106/64 mmHg - - 73 13 98 %  06/12/12 2245 105/67 mmHg - - 73 22 96 %  06/12/12 2230 97/52 mmHg - - 75 19 97 %   06/12/12 2215 93/52 mmHg - - 72 16 97 %  06/12/12 2200 116/41 mmHg - - 74 22 96 %  06/12/12 2151 99/53 mmHg 98.1 F (36.7 C) Oral 70 18 96 %    1. General:  in No Acute distress 2. Psychological: Alert and Oriented 3. Head/ENT:   Moist Mucous Membranes                          Head Non traumatic, neck supple                          Normal  Dentition 4. SKIN: normal  Skin turgor,  Skin clean Dry and intact no rash 5. Heart: Regular rate and rhythm no Murmur, Rub or gallop 6. Lungs: Clear to auscultation bilaterally, no wheezes or crackles   7. Abdomen: Soft, non-tender, Non distended 8. Lower extremities: no clubbing, cyanosis, or edema 9. Neurologically Grossly intact, moving all 4 extremities equally 10. MSK: Normal range of motion  body mass index is unknown because there is no height or weight on file.   Labs on Admission:   Recent Labs  06/12/12 2252  NA 132*  K 3.1*  CL 94*  CO2 26  GLUCOSE 114*  BUN 22  CREATININE 0.66  CALCIUM 9.4   No results found for this basename: AST, ALT, ALKPHOS, BILITOT, PROT, ALBUMIN,  in the last 72 hours No results found for this basename: LIPASE, AMYLASE,  in the last 72 hours  Recent Labs  06/12/12 2252  WBC 9.3  NEUTROABS 6.6  HGB 13.1  HCT 37.2  MCV 81.8  PLT 238    Recent Labs  06/12/12 2252  TROPONINI <0.30   No results found for this basename: TSH, T4TOTAL, FREET3, T3FREE, THYROIDAB,  in the last 72 hours No results found for this basename: VITAMINB12, FOLATE, FERRITIN, TIBC, IRON, RETICCTPCT,  in the last 72 hours No results found for this basename: HGBA1C    CrCl is unknown because there is no height on file for the current visit. ABG No results found for this basename: phart, pco2, po2, hco3, tco2, acidbasedef, o2sat     No results found for this basename: DDIMER     Other results:  I have pearsonaly reviewed this: ECG REPORT  Rate: 71  Rhythm: NSR ST&T Change: no evidence of  ischemia   Cultures: No results found for this basename: sdes, specrequest, cult, reptstatus       Radiological Exams on Admission: Dg Chest 2 View  06/12/2012  *RADIOLOGY REPORT*  Clinical Data: Chest pain, shortness of breath, history hypertension  CHEST - 2 VIEW  Comparison: Prior portable exam of 06/12/2012  Findings: Mild enlargement of cardiac silhouette. Mediastinal contours and pulmonary vascularity normal. Minimal atelectasis at right base. Lungs  otherwise clear. No pleural effusion or pneumothorax. Bones unremarkable.  IMPRESSION: Mild enlargement of cardiac silhouette. Minimal right basilar atelectasis.   Original Report Authenticated By: Ulyses Southward, M.D.    Dg Chest Port 1 View  06/12/2012  *RADIOLOGY REPORT*  Clinical Data: Chest pain and shortness of breath.  PORTABLE CHEST - 1 VIEW  Comparison: No priors.  Findings: Lung volumes are slightly low.  No consolidative airspace disease.  No pleural effusions.  Pulmonary vasculature is within normal limits.  Enlargement of the cardiopericardial silhouette has a "water bottle" appearance, which could suggest the presence of a pericardial effusion.  Upper mediastinal contours are slightly distorted by patient rotation to the right.  IMPRESSION: 1.  Enlargement of the cardiopericardial silhouette with a "water bottle" appearance, which could suggest the presence of a pericardial effusion.  Clinical correlation is recommended.   Original Report Authenticated By: Trudie Reed, M.D.     Chart has been reviewed  Assessment/Plan  57 year old female with history of hypertension presents with chest discomfort and evidence of cardiomegaly on chest x-ray  Present on Admission:  . Chest pain at rest - - given risk factors will admit such as family history of coronary artery disease, monitor on telemetry, cycle cardiac enzymes, obtain serial ECG. Further risk stratify with lipid panel, hgA1C, obtain TSH. Make sure patient is on Aspirin. Further  treatment based on the currently pending results. Will order 2-D echo given cardiomegaly for now will keep n.p.o. until cardiac markers have been negative at least twice  . HYPERTENSION - continue home medications  . HYPERLIPIDEMIA  - check lipid panel continue home medications . Hypokalemia -  will replace patient likely will need to be on chronic supplementation she is to stay on Lasix  . Cardiomegaly - will obtain 2-D echo patient have had mild peripheral edema she may have a previously undiagnosed heart failure which is currently well compensated. If there is evidence of heart failure an echo gram cardiology consult/follow up  would be helpful.   Prophylaxis:  Lovenox, Protonix  CODE STATUS: FULL CODE  Other plan as per orders.  I have spent a total of 55 min on this admission  Ann Graham 06/13/2012, 2:52 AM

## 2012-06-14 DIAGNOSIS — I519 Heart disease, unspecified: Secondary | ICD-10-CM

## 2012-06-14 DIAGNOSIS — I5189 Other ill-defined heart diseases: Secondary | ICD-10-CM

## 2012-06-14 DIAGNOSIS — E785 Hyperlipidemia, unspecified: Secondary | ICD-10-CM

## 2012-06-14 LAB — BASIC METABOLIC PANEL
BUN: 22 mg/dL (ref 6–23)
Calcium: 9.6 mg/dL (ref 8.4–10.5)
GFR calc Af Amer: >90 mL/min (ref >90)
GFR calc non Af Amer: >90 mL/min (ref >90)
Potassium: 5.6 mEq/L — ABNORMAL HIGH (ref 3.5–5.1)
Sodium: 134 mEq/L — ABNORMAL LOW (ref 135–145)

## 2012-06-14 MED ORDER — FUROSEMIDE 20 MG PO TABS: 20.0000 mg | ORAL_TABLET | Freq: Every day | ORAL | Status: DC

## 2012-06-14 MED ORDER — SODIUM POLYSTYRENE SULFONATE 15 GM/60ML PO SUSP
30.0000 g | Freq: Once | ORAL | Status: AC
  Administered 2012-06-14: 30 g via ORAL
  Filled 2012-06-14: qty 120

## 2012-06-14 MED ORDER — ATORVASTATIN CALCIUM 20 MG PO TABS: 20.0000 mg | ORAL_TABLET | Freq: Every day | ORAL | Status: DC

## 2012-06-14 MED ORDER — ASPIRIN 81 MG PO TBEC: 81.0000 mg | DELAYED_RELEASE_TABLET | Freq: Every day | ORAL | Status: DC

## 2012-06-14 MED ORDER — LISINOPRIL 10 MG PO TABS: 10.0000 mg | ORAL_TABLET | Freq: Every day | ORAL | Status: DC

## 2012-06-14 NOTE — Progress Notes (Signed)
Utilization Review Completed.Ann Graham T3/31/2014  

## 2012-06-14 NOTE — Discharge Summary (Signed)
Physician Discharge Summary  Ann Graham NFA:[id number] DOB: 09-11-1955 DOA: 06/12/2012  PCP: Thayer Headings, MD  Admit date: 06/12/2012 Discharge date: 06/14/2012  Time spent: 50* minutes  Recommendations for Outpatient Follow-up:  1. Follow up PCP in 3 days to check BMP as potassium is high 2. Follow up [facility] cardiology for appointment for possible stress test. They have been called and will call with appointment  Discharge Diagnoses:  Active Problems:   HYPERLIPIDEMIA   HYPERTENSION   Chest pain at rest   Hypokalemia   Cardiomegaly   Discharge Condition: Stable  Diet recommendation: Low salt diet  Filed Weights   06/13/12 0436  Weight: 98.9 kg (218 lb 0.6 oz)    History of present illness:  57 y.o. female  has a past medical history of Hypertension and Elevated cholesterol.  Presented with  1 week hx of bilateral leg swelling her PCP started her on lasix 4 days ago since then her swelling has improved. She denies any shortness of breath but can hear wheezing at times. She swims at least twice a week but never has any chest pain or shortness of breath with that. Her mother and brother died before 30 from CAD. She has no hx of CAD her self. She thinks her last stress test was 6 years ago and was unremarkable.  Earlier today she was trying to go to bed but started to have heaviness in her chest radiating to her back and she could not get comfortable. This sensation lasted for few hours but now resolved. She got aspirin 325 and nitro SL x1. Not sure what that the pain better.    Hospital Course:  Chest pain Resolved, she has hyperlipidemia, HTN, which are significant risk factors, cardiac enzymes x 3 are negative. Will need outpatient stress test with cardiology. Continue with baby aspirin.  Diastolic dysfunction/mild pulmonary edema Patient has diastolic dysfunction as per 2D echo, will d/c triamterene/HCTZ and start low dose lasix 20 mg po daily for mild pulmonary  edema  Hypertensin Will start lisinopril 10 mg po daily Also continue with po Lasix  Hyperkalemia Will give one dose of kayexalate 30 gm po x 1 Will need repeat BMP in 3 days at the PCP office  Hyperlipidemia Continue with lipitor 20 mg daily  CXR showed water bottle appearance which could have been due to pericardial effusion , but 2D echo did not show pericardial effusion.  Procedures: 2 d echo:Left ventricle: The cavity size was normal. Wall thickness was normal. Systolic function was normal. The estimated ejection fraction was in the range of 55% to 60%. Wall motion was normal; there were no regional wall motion abnormalities. Doppler parameters are consistent with abnormal left ventricular relaxation (grade 1 diastolic dysfunction). Pericardium: There was no pericardial effusion.      Consultations:  None   Discharge Exam: Filed Vitals:   06/13/12 1421 06/13/12 1951 06/14/12 0529 06/14/12 1418  BP: 127/79 143/75 134/73 147/78  Pulse: 74 79 60 82  Temp: 97.5 F (36.4 C) 97.3 F (36.3 C) 97.6 F (36.4 C) 97.6 F (36.4 C)  TempSrc: Oral Oral Oral Oral  Resp: 16 16 16 16   Height:      Weight:      SpO2: 100% 98% 98% 100%    General: appear in no acute distress Cardiovascular: s1s2 RRR Respiratory: clear bilaterally Ext: No edema  Discharge Instructions  Discharge Orders   Future Orders Complete By Expires     Diet - low sodium heart healthy  As directed     Discharge instructions  As directed     Comments:      Patient was admitted to the hospital for chest pain, and at this time she is dicharged back home. She is advised rest for five days till 06/19/12. Call with questions. Meredeth Ide  Hospitalist [phone]    Increase activity slowly  As directed         Medication List    STOP taking these medications       aspirin 325 MG tablet     triamterene-hydrochlorothiazide 37.5-25 MG per tablet  Commonly known as:  MAXZIDE-25      TAKE these  medications       aspirin 81 MG EC tablet  Take 1 tablet (81 mg total) by mouth daily.     atorvastatin 20 MG tablet  Commonly known as:  LIPITOR  Take 1 tablet (20 mg total) by mouth daily at 6 PM.     furosemide 20 MG tablet  Commonly known as:  LASIX  Take 1 tablet (20 mg total) by mouth daily.     lisinopril 10 MG tablet  Commonly known as:  PRINIVIL  Take 1 tablet (10 mg total) by mouth daily.     traZODone 50 MG tablet  Commonly known as:  DESYREL  Take 50 mg by mouth at bedtime.     Vitamin B-12 500 MCG Subl  Place 500 mcg under the tongue daily.           Follow-up Information   Follow up with Thayer Headings, MD In 3 days. (To check BMP for high potassium)    Contact information:   [address] [place] [address] [phone]       Follow up with [facility] Main Office [facility]). (They will call you for appointment, if you do not hear from them in a week. Call to make appointment)    Contact information:   [address] [place] [address] [phone]       The results of significant diagnostics from this hospitalization (including imaging, microbiology, ancillary and laboratory) are listed below for reference.    Significant Diagnostic Studies: Dg Chest 2 View  06/12/2012  *RADIOLOGY REPORT*  Clinical Data: Chest pain, shortness of breath, history hypertension  CHEST - 2 VIEW  Comparison: Prior portable exam of 06/12/2012  Findings: Mild enlargement of cardiac silhouette. Mediastinal contours and pulmonary vascularity normal. Minimal atelectasis at right base. Lungs otherwise clear. No pleural effusion or pneumothorax. Bones unremarkable.  IMPRESSION: Mild enlargement of cardiac silhouette. Minimal right basilar atelectasis.   Original Report Authenticated By: Ulyses Southward, M.D.    Dg Chest Port 1 View  06/12/2012  *RADIOLOGY REPORT*  Clinical Data: Chest pain and shortness of breath.  PORTABLE CHEST - 1 VIEW   Comparison: No priors.  Findings: Lung volumes are slightly low.  No consolidative airspace disease.  No pleural effusions.  Pulmonary vasculature is within normal limits.  Enlargement of the cardiopericardial silhouette has a "water bottle" appearance, which could suggest the presence of a pericardial effusion.  Upper mediastinal contours are slightly distorted by patient rotation to the right.  IMPRESSION: 1.  Enlargement of the cardiopericardial silhouette with a "water bottle" appearance, which could suggest the presence of a pericardial effusion.  Clinical correlation is recommended.   Original Report Authenticated By: Trudie Reed, M.D.     Microbiology: No results found for this or any previous visit (from the past  240 hour(s)).   Labs: Basic Metabolic Panel:  Recent Labs Lab 06/12/12 2252 06/13/12 0735 06/14/12 0415  NA 132* 137 134*  K 3.1* 3.8 5.6*  CL 94* 101 98  CO2 26 27 21   GLUCOSE 114* 101* 89  BUN 22 18 22   CREATININE 0.66 0.56 0.56  CALCIUM 9.4 9.5 9.6  MG  --  2.4  --   PHOS  --  4.5  --    Liver Function Tests:  Recent Labs Lab 06/13/12 0735  AST 20  ALT 24  ALKPHOS 88  BILITOT 0.8  PROT 7.1  ALBUMIN 3.6   No results found for this basename: LIPASE, AMYLASE,  in the last 168 hours No results found for this basename: AMMONIA,  in the last 168 hours CBC:  Recent Labs Lab 06/12/12 2252 06/13/12 0735  WBC 9.3 7.6  NEUTROABS 6.6  --   HGB 13.1 13.3  HCT 37.2 38.2  MCV 81.8 81.8  PLT 238 230   Cardiac Enzymes:  Recent Labs Lab 06/12/12 2252 06/13/12 0735 06/13/12 1012 06/13/12 1657  TROPONINI <0.30 <0.30 <0.30 <0.30   BNP: BNP (last 3 results) No results found for this basename: PROBNP,  in the last 8760 hours CBG: No results found for this basename: GLUCAP,  in the last 168 hours     Signed:  Endya Austin S  [facility] 06/14/2012, 2:21 PM

## 2012-06-16 ENCOUNTER — Encounter: Payer: Self-pay | Admitting: Cardiovascular Disease

## 2012-06-16 ENCOUNTER — Ambulatory Visit (INDEPENDENT_AMBULATORY_CARE_PROVIDER_SITE_OTHER): Payer: BC Managed Care – PPO | Admitting: Cardiovascular Disease

## 2012-06-16 VITALS — BP 118/78 | HR 74 | Ht 64.0 in | Wt 220.0 lb

## 2012-06-16 DIAGNOSIS — R079 Chest pain, unspecified: Secondary | ICD-10-CM

## 2012-06-16 NOTE — Patient Instructions (Addendum)
Your physician has requested that you have an exercise stress myoview. For further information please visit [email or web address] Please follow instruction sheet, as given.  Your physician recommends that you schedule a follow-up appointment as needed with Dr. Eden Emms.

## 2012-06-16 NOTE — Assessment & Plan Note (Signed)
F/U stress myovue ECG abnormal but poor R wave progression and flat lateral T;s likely from body habitus Has family history HTN and elevated lipids

## 2012-06-16 NOTE — Progress Notes (Signed)
Patient ID: Ann Graham, female   DOB: April 22, 1955, 58 y.o.   MRN: [id number] 57 yo referred post hospital for edema and chest pain:  Past medical history of Hypertension and Elevated cholesterol.   1 week hx of bilateral leg swelling her PCP started her on lasix 4 days ago since then her swelling has improved. She denies any shortness of breath but can hear wheezing at times. She swims at least twice a week but never has any chest pain or shortness of breath with that. Her mother and brother died before 42 from CAD. She has no hx of CAD her self. She thinks her last stress test was 6 years ago and was unremarkable.  Was trying to go to bed but started to have heaviness in her chest radiating to her back and she could not get comfortable. This sensation lasted for few hours but now resolved. She got aspirin 325 and nitro SL x1. Not sure what that the pain better.   In hosptial R/O No acute ECG changes CXR 3/29 with atelectasis and mild CE No CHF ECG SR rate 71 poor R wave progression no acute changes  She is under a lot of stress.  She had an oak tree fall on her house and is living in a hotel.  Still trying to work but stressed  ROS: Denies fever, malais, weight loss, blurry vision, decreased visual acuity, cough, sputum, SOB, hemoptysis, pleuritic pain, palpitaitons, heartburn, abdominal pain, melena, lower extremity edema, claudication, or rash.  All other systems reviewed and negative   General: Affect appropriate Healthy:  appears stated age HEENT: normal Neck supple with no adenopathy JVP normal no bruits no thyromegaly Lungs clear with no wheezing and good diaphragmatic motion Heart:  S1/S2 no murmur,rub, gallop or click PMI normal Abdomen: benighn, BS positve, no tenderness, no AAA no bruit.  No HSM or HJR Distal pulses intact with no bruits No edema Neuro non-focal Skin warm and dry No muscular weakness  Medications Current Outpatient Prescriptions  Medication Sig Dispense  Refill  . aspirin EC 81 MG EC tablet Take 1 tablet (81 mg total) by mouth daily.  30 tablet  2  . atorvastatin (LIPITOR) 20 MG tablet Take 1 tablet (20 mg total) by mouth daily at 6 PM.  30 tablet  2  . Cyanocobalamin (VITAMIN B-12) 500 MCG SUBL Place 500 mcg under the tongue daily.      . furosemide (LASIX) 20 MG tablet Take 1 tablet (20 mg total) by mouth daily.  30 tablet  2  . lisinopril (PRINIVIL) 10 MG tablet Take 1 tablet (10 mg total) by mouth daily.  30 tablet  2  . traZODone (DESYREL) 50 MG tablet Take 50 mg by mouth at bedtime.       No current facility-administered medications for this visit.    Allergies Hydrocodone  Family History: Family History  Problem Relation Age of Onset  . Heart disease Mother   . Heart disease Father   . Colon cancer Father   . Heart disease Brother     Social History: History   Social History  . Marital Status: Legally Separated    Spouse Name: N/A    Number of Children: N/A  . Years of Education: N/A   Occupational History  . Not on file.   Social History Main Topics  . Smoking status: Never Smoker   . Smokeless tobacco: Not on file  . Alcohol Use: No  . Drug Use: No  .  Sexually Active: Not on file   Other Topics Concern  . Not on file   Social History Narrative  . No narrative on file    Electrocardiogram:  See HPI    Assessment and Plan

## 2012-06-16 NOTE — Assessment & Plan Note (Signed)
Projected form CXR  Reviewed echo done 3/29 and normal LV size and function EF 60-65% no LVE

## 2012-06-16 NOTE — Assessment & Plan Note (Signed)
Well controlled.  Continue current medications and low sodium Dash type diet.    

## 2012-06-16 NOTE — Assessment & Plan Note (Signed)
Cholesterol is at goal.  Continue current dose of statin and diet Rx.  No myalgias or side effects.  F/U  LFT's in 6 months. Lab Results  Component Value Date   LDLCALC 187* 06/13/2012  f/U with primary Consider starting statin

## 2012-06-23 ENCOUNTER — Ambulatory Visit (HOSPITAL_COMMUNITY): Payer: BC Managed Care – PPO | Attending: Cardiovascular Disease | Admitting: Radiology

## 2012-06-23 VITALS — BP 113/69 | Ht 64.0 in | Wt 218.0 lb

## 2012-06-23 DIAGNOSIS — R110 Nausea: Secondary | ICD-10-CM | POA: Insufficient documentation

## 2012-06-23 DIAGNOSIS — R079 Chest pain, unspecified: Secondary | ICD-10-CM

## 2012-06-23 DIAGNOSIS — R5381 Other malaise: Secondary | ICD-10-CM | POA: Insufficient documentation

## 2012-06-23 DIAGNOSIS — Z8249 Family history of ischemic heart disease and other diseases of the circulatory system: Secondary | ICD-10-CM | POA: Insufficient documentation

## 2012-06-23 MED ORDER — TECHNETIUM TC 99M SESTAMIBI GENERIC - CARDIOLITE
33.0000 | Freq: Once | INTRAVENOUS | Status: AC | PRN
  Administered 2012-06-23: 33 via INTRAVENOUS

## 2012-06-23 MED ORDER — TECHNETIUM TC 99M SESTAMIBI GENERIC - CARDIOLITE
11.0000 | Freq: Once | INTRAVENOUS | Status: AC | PRN
  Administered 2012-06-23: 11 via INTRAVENOUS

## 2012-06-23 MED ORDER — REGADENOSON 0.4 MG/5ML IV SOLN
0.4000 mg | Freq: Once | INTRAVENOUS | Status: AC
  Administered 2012-06-23: 0.4 mg via INTRAVENOUS

## 2012-06-23 NOTE — Progress Notes (Signed)
[facility] SITE 3 NUCLEAR MED [address] [place], [place] [address] [phone]    Cardiology Nuclear Med Study  Ann Graham is a 57 y.o. female     MRN : [id number]     DOB: May 03, 1955  Procedure Date: 06/23/2012  Nuclear Med Background Indication for Stress Test:  Evaluation for Ischemia History: 3/14  Echo EF 55-60% Cardiac Risk Factors: Family History - CAD, Hypertension and Lipids  Symptoms:  Chest Pain, Fatigue, Nausea and Rapid HR   Nuclear Pre-Procedure Caffeine/Decaff Intake:  None NPO After: 8:30pm   Lungs:  clear O2 Sat: 99% on room air. IV 0.9% NS with Angio Cath:  22g  IV Site: R Hand  IV Started by:  Bonnita Levan, RN  Chest Size (in):  42 Cup Size: B  Height: 5\' 4"  (1.626 m)  Weight:  218 lb (98.884 kg)  BMI:  Body mass index is 37.4 kg/(m^2). Tech Comments:  N/A    Nuclear Med Study 1 or 2 day study: 1 day  Stress Test Type:  Treadmill/Lexiscan  Reading MD: Kristeen Miss, MD  Order Authorizing Provider:  Charlton Haws, MD  Resting Radionuclide: Technetium 17m Sestamibi  Resting Radionuclide Dose: 11.0 mCi   Stress Radionuclide:  Technetium 44m Sestamibi  Stress Radionuclide Dose: 33.0 mCi           Stress Protocol Rest HR: 56 Stress HR: 118  Rest BP: 113/69 Stress BP: 159/71  Exercise Time (min): 7:54 METS: 7.3   Predicted Max HR: 162 bpm % Max HR: 72.84 bpm Rate Pressure Product: 10272   Dose of Adenosine (mg):  n/a Dose of Lexiscan: 0.4 mg  Dose of Atropine (mg): n/a Dose of Dobutamine: n/a mcg/kg/min (at max HR)  Stress Test Technologist: Bonnita Levan, RN  Nuclear Technologist:  Domenic Polite, CNMT     Rest Procedure:  Myocardial perfusion imaging was performed at rest 45 minutes following the intravenous administration of Technetium 5m Sestamibi. Rest ECG: NSR - Normal EKG  Stress Procedure:  The patient walked the treadmill utilizing the Bruce protocol for 7:54. She c/o dyspnea and fatigue and was unable to achieve target  heart rate. She was changed to a walking Lexiscan. The patient received IV Lexiscan 0.4 mg over 15-seconds with concurrent low level exercise and then Technetium 62m Sestamibi was injected at 30-seconds while the patient continued walking one more minute.  Quantitative spect images were obtained after a 45-minute delay. Stress ECG: No significant change from baseline ECG  QPS Raw Data Images:  Normal; no motion artifact; normal heart/lung ratio. Stress Images:  Normal homogeneous uptake in all areas of the myocardium. Rest Images:  Normal homogeneous uptake in all areas of the myocardium. Subtraction (SDS):  No evidence of ischemia. Transient Ischemic Dilatation (Normal <1.22):  0.95 Lung/Heart Ratio (Normal <0.45):  0.35  Quantitative Gated Spect Images QGS EDV:  71 ml QGS ESV:  17 ml  Impression Exercise Capacity:  Good exercise capacity. BP Response:  Normal blood pressure response. Clinical Symptoms:  No significant symptoms noted. ECG Impression:  No significant ST segment change suggestive of ischemia. Comparison with Prior Nuclear Study: No images to compare  Overall Impression:  Normal stress nuclear study.  No evidence of ischemia. Normal LV function;  LV Ejection Fraction: 75%.  LV Wall Motion:  NL LV Function; NL Wall Motion.    Vesta Mixer, Montez Hageman., MD, [facility] 06/23/2012, 4:05 PM Office - [phone] Pager [phone]

## 2012-09-01 ENCOUNTER — Encounter (HOSPITAL_COMMUNITY): Payer: Self-pay | Admitting: Emergency Medicine

## 2012-09-01 ENCOUNTER — Emergency Department (HOSPITAL_COMMUNITY): Payer: BC Managed Care – PPO

## 2012-09-01 ENCOUNTER — Emergency Department (HOSPITAL_COMMUNITY)
Admission: EM | Admit: 2012-09-01 | Discharge: 2012-09-01 | Disposition: A | Discharge status: Home / Self Care | Payer: BC Managed Care – PPO | Attending: Emergency Medicine | Admitting: Emergency Medicine

## 2012-09-01 DIAGNOSIS — R0602 Shortness of breath: Secondary | ICD-10-CM | POA: Insufficient documentation

## 2012-09-01 DIAGNOSIS — R0789 Other chest pain: Secondary | ICD-10-CM | POA: Insufficient documentation

## 2012-09-01 DIAGNOSIS — R079 Chest pain, unspecified: Secondary | ICD-10-CM

## 2012-09-01 DIAGNOSIS — E7800 Pure hypercholesterolemia, unspecified: Secondary | ICD-10-CM | POA: Insufficient documentation

## 2012-09-01 DIAGNOSIS — R110 Nausea: Secondary | ICD-10-CM | POA: Insufficient documentation

## 2012-09-01 DIAGNOSIS — I10 Essential (primary) hypertension: Secondary | ICD-10-CM | POA: Insufficient documentation

## 2012-09-01 DIAGNOSIS — Z79899 Other long term (current) drug therapy: Secondary | ICD-10-CM | POA: Insufficient documentation

## 2012-09-01 DIAGNOSIS — Z7982 Long term (current) use of aspirin: Secondary | ICD-10-CM | POA: Insufficient documentation

## 2012-09-01 DIAGNOSIS — I509 Heart failure, unspecified: Secondary | ICD-10-CM | POA: Insufficient documentation

## 2012-09-01 LAB — BASIC METABOLIC PANEL
Calcium: 9.2 mg/dL (ref 8.4–10.5)
Chloride: 105 mEq/L (ref 96–112)
Creatinine, Ser: 0.69 mg/dL (ref 0.50–1.10)
GFR calc Af Amer: >90 mL/min (ref >90)
GFR calc non Af Amer: >90 mL/min (ref >90)

## 2012-09-01 LAB — CBC
MCHC: 34.6 g/dL (ref 30.0–36.0)
MCV: 83.8 fL (ref 78.0–100.0)
Platelets: 221 10*3/uL (ref 150–400)
RDW: 14.3 % (ref 11.5–15.5)
WBC: 6.8 10*3/uL (ref 4.0–10.5)

## 2012-09-01 LAB — PRO B NATRIURETIC PEPTIDE: Pro B Natriuretic peptide (BNP): 68.6 pg/mL (ref 0–125)

## 2012-09-01 NOTE — ED Provider Notes (Signed)
2:47 PM  Date: 09/01/2012  Rate: 59  Rhythm: normal sinus rhythm  QRS Axis: normal  Intervals: normal  ST/T Wave abnormalities: nonspecific T wave changes  Conduction Disutrbances:none  Narrative Interpretation: Borderline EKG  Old EKG Reviewed: unchanged    Carleene Cooper III, MD 09/01/12 [phone]

## 2012-09-01 NOTE — ED Notes (Signed)
Pt reports she has been under a lot of stress lately because a tree fell on her house, has been living out of a hotel and been having stress from work and working late hours. Pt c/o sudden onset of CP that happened about 45 mins ago. Pt reports she went a took an ASA and then called EMS. Pt reports after they administered the nitro her CP decreased to about a 4/10. Pt in nad, skin warm and dry, resp e/u.

## 2012-09-01 NOTE — ED Notes (Signed)
Per EMS - pt was at work when she had a sudden onset on center CP. Pain was 7/10 no radiation. No nausea/sob. In march pt had similar pain and was dx with CHF. EMS administered 324 ASA, 1 Nitro and started an IV. BP 106/80 HR 70 NSR RR 18.

## 2012-09-01 NOTE — ED Notes (Signed)
Lab at bedside

## 2012-09-01 NOTE — ED Provider Notes (Signed)
History     CSN: [id number]  Arrival date & time 09/01/12  1348   First MD Initiated Contact with Patient 09/01/12 1404      Chief Complaint  Patient presents with  . Chest Pain    (Consider location/radiation/quality/duration/timing/severity/associated sxs/prior treatment) HPI Comments: Patient is a 57 year old female with a history of newly diagnosed congestive heart failure who presents today with 30 minutes of chest pressure. It was associated with shortness of breath and nausea. No diaphoresis. She states she has had pain like this before when she was diagnosed with the failure and it scared her. 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 There have been quite a few stressors in her life recently including the fact that she is living in a hotel due to a tree that fell through her house. She also states she has not been taking it easy at work as suggested by her cardiologist and has had to close many nights. Currently her pain is resolved and she feels significantly improved.   Patient is a 57 y.o. female presenting with chest pain. The history is provided by the patient. No language interpreter was used.  Chest Pain Associated symptoms: nausea and shortness of breath   Associated symptoms: no abdominal pain, no diaphoresis, no fever and not vomiting     Past Medical History  Diagnosis Date  . Hypertension   . Elevated cholesterol   . CHF (congestive heart failure) 2014    History reviewed. No pertinent past surgical history.  Family History  Problem Relation Age of Onset  . Heart disease Mother   . Heart disease Father   . Colon cancer Father   . Heart disease Brother     History  Substance Use Topics  . Smoking status: Never Smoker   . Smokeless tobacco: Not on file  . Alcohol Use: No    OB History   Grav Para Term Preterm Abortions TAB SAB Ect Mult Living                  Review of Systems  Constitutional: Negative for fever, chills  and diaphoresis.  Respiratory: Positive for chest tightness and shortness of breath.   Cardiovascular: Positive for chest pain.  Gastrointestinal: Positive for nausea. Negative for vomiting and abdominal pain.  All other systems reviewed and are negative.    Allergies  Hydrocodone  Home Medications   Current Outpatient Rx  Name  Route  Sig  Dispense  Refill  . aspirin EC 81 MG EC tablet   Oral   Take 1 tablet (81 mg total) by mouth daily.   30 tablet   2   . aspirin 81 MG chewable tablet   Oral   Chew 324 mg by mouth once.         Marland Kitchen atorvastatin (LIPITOR) 20 MG tablet   Oral   Take 1 tablet (20 mg total) by mouth daily at 6 PM.   30 tablet   2   . Cyanocobalamin (VITAMIN B-12) 500 MCG SUBL   Sublingual   Place 500 mcg under the tongue daily.         . furosemide (LASIX) 20 MG tablet   Oral   Take 1 tablet (20 mg total) by mouth daily.   30 tablet   2   . lisinopril (PRINIVIL) 10 MG tablet   Oral   Take 1 tablet (10 mg total) by mouth daily.   30 tablet   2   .  traZODone (DESYREL) 50 MG tablet   Oral   Take 50 mg by mouth at bedtime.           BP 114/67  Temp(Src) 98.4 F (36.9 C) (Oral)  Resp 19  SpO2 96%  Physical Exam  Nursing note and vitals reviewed. Constitutional: She is oriented to person, place, and time. Vital signs are normal. She appears well-developed and well-nourished. She does not appear ill. No distress.  No distress, very conversant and pleasant   HENT:  Head: Normocephalic and atraumatic.  Right Ear: External ear normal.  Left Ear: External ear normal.  Nose: Nose normal.  Mouth/Throat: Oropharynx is clear and moist.  Eyes: Conjunctivae are normal.  Neck: Normal range of motion.  Cardiovascular: Normal rate, regular rhythm and normal heart sounds.   Pulmonary/Chest: Effort normal and breath sounds normal. No stridor. No respiratory distress. She has no wheezes. She has no rales.  Abdominal: Soft. She exhibits no  distension.  Musculoskeletal: Normal range of motion.  Neurological: She is alert and oriented to person, place, and time. She has normal strength.  Skin: Skin is warm and dry. She is not diaphoretic. No erythema.  Psychiatric: She has a normal mood and affect. Her behavior is normal.    ED Course  Procedures (including critical care time)  Labs Reviewed  CBC - Abnormal; Notable for the following:    HCT 35.8 (*)    All other components within normal limits  BASIC METABOLIC PANEL - Abnormal; Notable for the following:    Potassium 3.2 (*)    BUN 29 (*)    All other components within normal limits  PRO B NATRIURETIC PEPTIDE  POCT I-STAT TROPONIN I   Dg Chest 2 View  09/01/2012   *RADIOLOGY REPORT*  Clinical Data: Chest pain  CHEST - 2 VIEW  Comparison: 03/29 at 14  Findings: Cardiac enlargement without heart failure.  Lungs are free of infiltrate or effusion.  Negative for mass lesion.  Heart size is stable since the prior study.  IMPRESSION: Cardiac enlargement.  No acute abnormality.   Original Report Authenticated By: Janeece Riggers, M.D.   Date: 09/01/2012  Rate: 59  Rhythm: normal sinus rhythm  QRS Axis: normal  Intervals: normal  ST/T Wave abnormalities: nonspecific T wave changes  Conduction Disutrbances:none  Narrative Interpretation: Old EKG Reviewed: unchanged   1. Shortness of breath   2. Chest pain       MDM  Patient is a 57 year old female with history of newly diagnosed CHF who presents today with an episode of chest pressure and shortness of breath. Labs grossly unremarkable. Follow up with your cardiologist. Dr. Ignacia Palma evaluated this patient and agrees with plan to discharge. Strict return instructions given. Vital signs stable for discharge. Patient / Family / Caregiver informed of clinical course, understand medical decision-making process, and agree with plan.         Mora Bellman, PA-C 09/02/12 1415  Mora Bellman, PA-C 09/02/12 1416

## 2012-09-03 NOTE — ED Provider Notes (Signed)
Medical screening examination/treatment/procedure(s) were conducted as a shared visit with non-physician practitioner(s) and myself.  I personally evaluated the patient during the encounter Pleasant lady who was standing at work and had chest pressure.  Exam is benign and lab tests were negative.  Reassured and released.  Carleene Cooper III, MD 09/03/12 [phone]

## 2012-09-23 ENCOUNTER — Encounter: Payer: BC Managed Care – PPO | Admitting: Physician Assistant

## 2012-09-23 ENCOUNTER — Encounter: Payer: Self-pay | Admitting: Physician Assistant

## 2012-09-23 NOTE — Progress Notes (Signed)
This encounter was created in error - please disregard.

## 2012-12-10 ENCOUNTER — Ambulatory Visit: Payer: BC Managed Care – PPO | Admitting: Cardiovascular Disease

## 2013-01-20 ENCOUNTER — Other Ambulatory Visit: Payer: Self-pay

## 2013-02-23 ENCOUNTER — Other Ambulatory Visit: Payer: Self-pay | Admitting: Internal Medicine

## 2013-02-23 ENCOUNTER — Other Ambulatory Visit: Payer: Self-pay | Admitting: Registered Nurse

## 2013-02-23 ENCOUNTER — Other Ambulatory Visit (HOSPITAL_COMMUNITY)
Admission: RE | Admit: 2013-02-23 | Discharge: 2013-02-23 | Disposition: A | Discharge status: Home / Self Care | Payer: BC Managed Care – PPO | Source: Ambulatory Visit | Attending: Internal Medicine | Admitting: Internal Medicine

## 2013-02-23 DIAGNOSIS — R1032 Left lower quadrant pain: Secondary | ICD-10-CM

## 2013-02-23 DIAGNOSIS — Z01419 Encounter for gynecological examination (general) (routine) without abnormal findings: Secondary | ICD-10-CM | POA: Insufficient documentation

## 2013-02-28 ENCOUNTER — Ambulatory Visit
Admission: RE | Admit: 2013-02-28 | Discharge: 2013-02-28 | Disposition: A | Discharge status: Home / Self Care | Payer: BC Managed Care – PPO | Source: Ambulatory Visit | Attending: Internal Medicine | Admitting: Internal Medicine

## 2013-02-28 DIAGNOSIS — R1032 Left lower quadrant pain: Secondary | ICD-10-CM

## 2013-03-07 ENCOUNTER — Encounter: Payer: Self-pay | Admitting: Gastroenterology

## 2013-04-06 ENCOUNTER — Ambulatory Visit (INDEPENDENT_AMBULATORY_CARE_PROVIDER_SITE_OTHER): Payer: BC Managed Care – PPO | Admitting: Gastroenterology

## 2013-04-06 ENCOUNTER — Encounter: Payer: Self-pay | Admitting: Gastroenterology

## 2013-04-06 VITALS — BP 130/82 | HR 64 | Ht 64.0 in | Wt 220.0 lb

## 2013-04-06 DIAGNOSIS — R1012 Left upper quadrant pain: Secondary | ICD-10-CM

## 2013-04-06 DIAGNOSIS — K921 Melena: Secondary | ICD-10-CM

## 2013-04-06 DIAGNOSIS — Z800 Family history of malignant neoplasm of digestive organs: Secondary | ICD-10-CM

## 2013-04-06 DIAGNOSIS — K5900 Constipation, unspecified: Secondary | ICD-10-CM

## 2013-04-06 MED ORDER — PEG-KCL-NACL-NASULF-NA ASC-C 100 G PO SOLR: 1.0000 | Freq: Once | ORAL | Status: DC

## 2013-04-06 NOTE — Patient Instructions (Signed)
You have been scheduled for a CT scan of the abdomen and pelvis at [facility] ([address]---this is in the same building as [facility]).   You are scheduled on 04/12/13 at 9:30am. You should arrive 15 minutes prior to your appointment time for registration. Please follow the written instructions below on the day of your exam:  WARNING: IF YOU ARE ALLERGIC TO IODINE/X-RAY DYE, PLEASE NOTIFY RADIOLOGY IMMEDIATELY AT [phone]! YOU WILL BE GIVEN A 13 HOUR PREMEDICATION PREP.  1) Do not eat or drink anything after 5:30am (4 hours prior to your test) 2) You have been given 2 bottles of oral contrast to drink. The solution may taste better if refrigerated, but do NOT add ice or any other liquid to this solution. Shake well before drinking.    Drink 1 bottle of contrast @ 7:30am (2 hours prior to your exam)  Drink 1 bottle of contrast @ 8:30am (1 hour prior to your exam)  You may take any medications as prescribed with a small amount of water except for the following: Metformin, Glucophage, Glucovance, Avandamet, Riomet, Fortamet, Actoplus Met, Janumet, Glumetza or Metaglip. The above medications must be held the day of the exam AND 48 hours after the exam.  The purpose of you drinking the oral contrast is to aid in the visualization of your intestinal tract. The contrast solution may cause some diarrhea. Before your exam is started, you will be given a small amount of fluid to drink. Depending on your individual set of symptoms, you may also receive an intravenous injection of x-ray contrast/dye. Plan on being at [facility] for 30 minutes or long, depending on the type of exam you are having performed.  This test typically takes 30-45 minutes to complete.  If you have any questions regarding your exam or if you need to reschedule, you may call the CT department at [phone] between the hours of 8:00 am and 5:00 pm,  Monday-Friday.  ________________________________________________________________________    Ann Graham have been scheduled for a colonoscopy with propofol. Please follow written instructions given to you at your visit today.  Please pick up your prep kit at the pharmacy within the next 1-3 days. If you use inhalers (even only as needed), please bring them with you on the day of your procedure. Your physician has requested that you go to www.startemmi.com and enter the access code given to you at your visit today. This web site gives a general overview about your procedure. However, you should still follow specific instructions given to you by our office regarding your preparation for the procedure.   Thank you for choosing me and [facility].  [name]. [name]., MD., [name]  cc: Thressa Sheller, MD

## 2013-04-06 NOTE — Progress Notes (Addendum)
    History of Present Illness: This is a 58 year old female who relates a 4 month history of intermittent left upper quadrant pain, intermittent constipation and intermittent small-volume rectal bleeding. She relates that her left upper quadrant pain is not generally correlate with bowel movements. It is occasionally brought on by certain foods including bread.  She relates that her father developed colon cancer at age 37. She relates she had a colonoscopy performed at [facility] gastroenterology. We asked the patient to obtain her GI medical records before the appointment today but she did not do so. She states she's been under great deal of stress this year with her home severely damaged by a fallen tree and her husband passing away. Denies weight loss, diarrhea, change in stool caliber, melena, nausea, vomiting, dysphagia, reflux symptoms, chest pain.   Review of Systems: Pertinent positive and negative review of systems were noted in the above HPI section. All other review of systems were otherwise negative.  Current Medications, Allergies, Past Medical History, Past Surgical History, Family History and Social History were reviewed in [employer] record.  Physical Exam: General: Well developed , well nourished, no acute distress Head: Normocephalic and atraumatic Eyes:  sclerae anicteric, EOMI Ears: Normal auditory acuity Mouth: No deformity or lesions Neck: Supple, no masses or thyromegaly Lungs: Clear throughout to auscultation Heart: Regular rate and rhythm; no murmurs, rubs or bruits Abdomen: Soft, mild left upper quadrant tenderness and non distended. No masses, hepatosplenomegaly or hernias noted. Normal Bowel sounds Rectal: Deferred to colonoscopy Musculoskeletal: Symmetrical with no gross deformities  Skin: No lesions on visible extremities Pulses:  Normal pulses noted Extremities: No clubbing, cyanosis, edema or deformities noted Neurological: Alert  oriented x 4, grossly nonfocal Cervical Nodes:  No significant cervical adenopathy Inguinal Nodes: No significant inguinal adenopathy Psychological:  Alert and cooperative. Normal mood and affect  Assessment and Recommendations:  1. Small-volume hematochezia, family history of colon cancer, personal history of colon polyps, left upper quadrant pain and constipation. Request records from [facility]. Increase dietary fiber and water intake. Schedule colonoscopy and abdominal/pelvic CT. The risks, benefits, and alternatives to colonoscopy with possible biopsy and possible polypectomy were discussed with the patient and they consent to proceed.    04/11/13. Records received from [facility]. She underwent colonoscopy on 01/01/2011 showing small internal hemorrhoids and a small polyp. Pathology report of the polyp showed benign colonic mucosa. She was recommended to have a five-year followup.

## 2013-04-08 ENCOUNTER — Encounter: Payer: Self-pay | Admitting: Gastroenterology

## 2013-04-12 ENCOUNTER — Ambulatory Visit (INDEPENDENT_AMBULATORY_CARE_PROVIDER_SITE_OTHER)
Admission: RE | Admit: 2013-04-12 | Discharge: 2013-04-12 | Disposition: A | Discharge status: Home / Self Care | Payer: BC Managed Care – PPO | Source: Ambulatory Visit | Attending: Gastroenterology | Admitting: Gastroenterology

## 2013-04-12 DIAGNOSIS — K5900 Constipation, unspecified: Secondary | ICD-10-CM

## 2013-04-12 DIAGNOSIS — K921 Melena: Secondary | ICD-10-CM

## 2013-04-12 DIAGNOSIS — R1012 Left upper quadrant pain: Secondary | ICD-10-CM

## 2013-04-12 MED ORDER — IOHEXOL 300 MG/ML  SOLN
100.0000 mL | Freq: Once | INTRAMUSCULAR | Status: AC | PRN
  Administered 2013-04-12: 100 mL via INTRAVENOUS

## 2013-04-13 ENCOUNTER — Telehealth: Payer: Self-pay | Admitting: Gastroenterology

## 2013-04-13 NOTE — Telephone Encounter (Signed)
Patient given recommendations. Faxed report to Dr. Thressa Sheller.

## 2013-04-13 NOTE — Telephone Encounter (Signed)
Left a message for patient to call me. 

## 2013-04-13 NOTE — Telephone Encounter (Signed)
Patient calling because she read her CT report and she wants to know if the calcified atherosclerotic disease is something she should be worried about. Please, advise.

## 2013-04-13 NOTE — Telephone Encounter (Signed)
Atherosclerosis is a common disorder. Since this is not a GI problem she needs to review this with her PCP. Please send a copy of the CT to her PCP.

## 2013-04-19 ENCOUNTER — Encounter: Payer: Self-pay | Admitting: Gastroenterology

## 2013-04-19 ENCOUNTER — Ambulatory Visit (AMBULATORY_SURGERY_CENTER): Payer: BC Managed Care – PPO | Admitting: Gastroenterology

## 2013-04-19 VITALS — BP 142/88 | HR 55 | Temp 97.0°F | Resp 16 | Ht 64.0 in | Wt 220.0 lb

## 2013-04-19 DIAGNOSIS — K921 Melena: Secondary | ICD-10-CM

## 2013-04-19 DIAGNOSIS — R1012 Left upper quadrant pain: Secondary | ICD-10-CM

## 2013-04-19 DIAGNOSIS — D126 Benign neoplasm of colon, unspecified: Secondary | ICD-10-CM

## 2013-04-19 DIAGNOSIS — Z800 Family history of malignant neoplasm of digestive organs: Secondary | ICD-10-CM

## 2013-04-19 DIAGNOSIS — Z86010 Personal history of colonic polyps: Secondary | ICD-10-CM

## 2013-04-19 MED ORDER — SODIUM CHLORIDE 0.9 % IV SOLN: 500.0000 mL | INTRAVENOUS | Status: DC

## 2013-04-19 NOTE — Patient Instructions (Signed)
Discharge instructions given with verbal understanding. Handout on polyps. Resume previous medications. YOU HAD AN ENDOSCOPIC PROCEDURE TODAY AT THE [facility]: Refer to the procedure report that was given to you for any specific questions about what was found during the examination.  If the procedure report does not answer your questions, please call your gastroenterologist to clarify.  If you requested that your care partner not be given the details of your procedure findings, then the procedure report has been included in a sealed envelope for you to review at your convenience later.  YOU SHOULD EXPECT: Some feelings of bloating in the abdomen. Passage of more gas than usual.  Walking can help get rid of the air that was put into your GI tract during the procedure and reduce the bloating. If you had a lower endoscopy (such as a colonoscopy or flexible sigmoidoscopy) you may notice spotting of blood in your stool or on the toilet paper. If you underwent a bowel prep for your procedure, then you may not have a normal bowel movement for a few days.  DIET: Your first meal following the procedure should be a light meal and then it is ok to progress to your normal diet.  A half-sandwich or bowl of soup is an example of a good first meal.  Heavy or fried foods are harder to digest and may make you feel nauseous or bloated.  Likewise meals heavy in dairy and vegetables can cause extra gas to form and this can also increase the bloating.  Drink plenty of fluids but you should avoid alcoholic beverages for 24 hours.  ACTIVITY: Your care partner should take you home directly after the procedure.  You should plan to take it easy, moving slowly for the rest of the day.  You can resume normal activity the day after the procedure however you should NOT DRIVE or use heavy machinery for 24 hours (because of the sedation medicines used during the test).    SYMPTOMS TO REPORT IMMEDIATELY: A  gastroenterologist can be reached at any hour.  During normal business hours, 8:30 AM to 5:00 PM Monday through Friday, call [phone].  After hours and on weekends, please call the GI answering service at [phone] who will take a message and have the physician on call contact you.   Following lower endoscopy (colonoscopy or flexible sigmoidoscopy):  Excessive amounts of blood in the stool  Significant tenderness or worsening of abdominal pains  Swelling of the abdomen that is new, acute  Fever of 100F or higher  FOLLOW UP: If any biopsies were taken you will be contacted by phone or by letter within the next 1-3 weeks.  Call your gastroenterologist if you have not heard about the biopsies in 3 weeks.  Our staff will call the home number listed on your records the next business day following your procedure to check on you and address any questions or concerns that you may have at that time regarding the information given to you following your procedure. This is a courtesy call and so if there is no answer at the home number and we have not heard from you through the emergency physician on call, we will assume that you have returned to your regular daily activities without incident.  SIGNATURES/CONFIDENTIALITY: You and/or your care partner have signed paperwork which will be entered into your electronic medical record.  These signatures attest to the fact that that the information above on your After Visit Summary has been   reviewed and is understood.  Full responsibility of the confidentiality of this discharge information lies with you and/or your care-partner. 

## 2013-04-19 NOTE — Progress Notes (Signed)
A/ox3 pleased with MAC, report to Celia RN 
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
## 2013-04-19 NOTE — Progress Notes (Signed)
Called to room to assist during endoscopic procedure.  Patient ID and intended procedure confirmed with present staff. Received instructions for my participation in the procedure from the performing physician.  

## 2013-04-20 ENCOUNTER — Telehealth: Payer: Self-pay | Admitting: *Deleted

## 2013-04-20 NOTE — Telephone Encounter (Signed)
Message left

## 2013-04-26 ENCOUNTER — Encounter: Payer: Self-pay | Admitting: Gastroenterology

## 2013-05-04 ENCOUNTER — Telehealth: Payer: Self-pay | Admitting: Gastroenterology

## 2013-05-04 NOTE — Telephone Encounter (Signed)
Reviewed the results of the path with the patient.  She is having continued abdominal pain.  She is encouraged to come back and see Dr. Fuller Plan . She wants to wait about a month.  She is scheduled for 06/01/13

## 2013-06-01 ENCOUNTER — Ambulatory Visit: Payer: BC Managed Care – PPO | Admitting: Gastroenterology

## 2014-02-03 ENCOUNTER — Encounter (HOSPITAL_COMMUNITY): Payer: Self-pay | Admitting: *Deleted

## 2014-02-03 ENCOUNTER — Emergency Department (HOSPITAL_COMMUNITY): Payer: BC Managed Care – PPO

## 2014-02-03 ENCOUNTER — Emergency Department (HOSPITAL_COMMUNITY)
Admission: EM | Admit: 2014-02-03 | Discharge: 2014-02-03 | Disposition: A | Discharge status: Home / Self Care | Payer: BC Managed Care – PPO | Attending: Emergency Medicine | Admitting: Emergency Medicine

## 2014-02-03 DIAGNOSIS — Y998 Other external cause status: Secondary | ICD-10-CM | POA: Insufficient documentation

## 2014-02-03 DIAGNOSIS — R0789 Other chest pain: Secondary | ICD-10-CM

## 2014-02-03 DIAGNOSIS — F419 Anxiety disorder, unspecified: Secondary | ICD-10-CM | POA: Diagnosis not present

## 2014-02-03 DIAGNOSIS — S20211A Contusion of right front wall of thorax, initial encounter: Secondary | ICD-10-CM | POA: Diagnosis not present

## 2014-02-03 DIAGNOSIS — E780 Pure hypercholesterolemia: Secondary | ICD-10-CM | POA: Diagnosis not present

## 2014-02-03 DIAGNOSIS — Z79899 Other long term (current) drug therapy: Secondary | ICD-10-CM | POA: Diagnosis not present

## 2014-02-03 DIAGNOSIS — Y9389 Activity, other specified: Secondary | ICD-10-CM | POA: Diagnosis not present

## 2014-02-03 DIAGNOSIS — Z7982 Long term (current) use of aspirin: Secondary | ICD-10-CM | POA: Insufficient documentation

## 2014-02-03 DIAGNOSIS — I10 Essential (primary) hypertension: Secondary | ICD-10-CM | POA: Insufficient documentation

## 2014-02-03 DIAGNOSIS — S29001A Unspecified injury of muscle and tendon of front wall of thorax, initial encounter: Secondary | ICD-10-CM | POA: Diagnosis present

## 2014-02-03 DIAGNOSIS — Y92410 Unspecified street and highway as the place of occurrence of the external cause: Secondary | ICD-10-CM | POA: Diagnosis not present
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
## 2014-07-03 ENCOUNTER — Other Ambulatory Visit: Payer: Self-pay | Admitting: Surgery

## 2016-04-09 DIAGNOSIS — H1033 Unspecified acute conjunctivitis, bilateral: Secondary | ICD-10-CM | POA: Diagnosis not present

## 2016-05-13 DIAGNOSIS — E7800 Pure hypercholesterolemia, unspecified: Secondary | ICD-10-CM | POA: Diagnosis not present

## 2016-05-16 DIAGNOSIS — H938X9 Other specified disorders of ear, unspecified ear: Secondary | ICD-10-CM | POA: Diagnosis not present

## 2016-05-16 DIAGNOSIS — H01006 Unspecified blepharitis left eye, unspecified eyelid: Secondary | ICD-10-CM | POA: Diagnosis not present

## 2016-06-02 DIAGNOSIS — E7800 Pure hypercholesterolemia, unspecified: Secondary | ICD-10-CM | POA: Diagnosis not present

## 2016-06-26 DIAGNOSIS — J069 Acute upper respiratory infection, unspecified: Secondary | ICD-10-CM | POA: Diagnosis not present

## 2016-07-28 DIAGNOSIS — E7800 Pure hypercholesterolemia, unspecified: Secondary | ICD-10-CM | POA: Diagnosis not present

## 2016-09-25 DIAGNOSIS — D3610 Benign neoplasm of peripheral nerves and autonomic nervous system, unspecified: Secondary | ICD-10-CM | POA: Diagnosis not present

## 2016-09-25 DIAGNOSIS — L821 Other seborrheic keratosis: Secondary | ICD-10-CM | POA: Diagnosis not present

## 2016-09-25 DIAGNOSIS — D1801 Hemangioma of skin and subcutaneous tissue: Secondary | ICD-10-CM | POA: Diagnosis not present

## 2016-09-25 DIAGNOSIS — D225 Melanocytic nevi of trunk: Secondary | ICD-10-CM | POA: Diagnosis not present

## 2016-10-14 DIAGNOSIS — H40033 Anatomical narrow angle, bilateral: Secondary | ICD-10-CM | POA: Diagnosis not present

## 2016-10-14 DIAGNOSIS — H1013 Acute atopic conjunctivitis, bilateral: Secondary | ICD-10-CM | POA: Diagnosis not present

## 2016-10-22 DIAGNOSIS — H04123 Dry eye syndrome of bilateral lacrimal glands: Secondary | ICD-10-CM | POA: Diagnosis not present

## 2016-11-17 DIAGNOSIS — L02412 Cutaneous abscess of left axilla: Secondary | ICD-10-CM | POA: Diagnosis not present

## 2016-11-17 DIAGNOSIS — L03114 Cellulitis of left upper limb: Secondary | ICD-10-CM | POA: Diagnosis not present

## 2016-11-20 DIAGNOSIS — L02412 Cutaneous abscess of left axilla: Secondary | ICD-10-CM | POA: Diagnosis not present

## 2016-11-20 DIAGNOSIS — L03112 Cellulitis of left axilla: Secondary | ICD-10-CM | POA: Diagnosis not present

## 2017-05-29 ENCOUNTER — Other Ambulatory Visit: Payer: Self-pay | Admitting: Family Medicine

## 2017-05-29 DIAGNOSIS — Z1231 Encounter for screening mammogram for malignant neoplasm of breast: Secondary | ICD-10-CM

## 2017-06-16 ENCOUNTER — Ambulatory Visit
Admission: RE | Admit: 2017-06-16 | Discharge: 2017-06-16 | Disposition: A | Discharge status: Home / Self Care | Payer: BLUE CROSS/BLUE SHIELD | Source: Ambulatory Visit | Attending: Family Medicine | Admitting: Family Medicine

## 2017-06-16 DIAGNOSIS — Z1231 Encounter for screening mammogram for malignant neoplasm of breast: Secondary | ICD-10-CM | POA: Diagnosis not present

## 2017-06-18 ENCOUNTER — Other Ambulatory Visit: Payer: Self-pay | Admitting: Family Medicine

## 2017-06-18 DIAGNOSIS — R928 Other abnormal and inconclusive findings on diagnostic imaging of breast: Secondary | ICD-10-CM

## 2017-06-22 ENCOUNTER — Other Ambulatory Visit: Payer: Self-pay | Admitting: Family Medicine

## 2017-06-22 ENCOUNTER — Ambulatory Visit
Admission: RE | Admit: 2017-06-22 | Discharge: 2017-06-22 | Disposition: A | Discharge status: Home / Self Care | Payer: BLUE CROSS/BLUE SHIELD | Source: Ambulatory Visit | Attending: Family Medicine | Admitting: Family Medicine

## 2017-06-22 DIAGNOSIS — N6002 Solitary cyst of left breast: Secondary | ICD-10-CM

## 2017-06-22 DIAGNOSIS — R928 Other abnormal and inconclusive findings on diagnostic imaging of breast: Secondary | ICD-10-CM | POA: Diagnosis not present

## 2017-06-25 ENCOUNTER — Other Ambulatory Visit: Payer: Self-pay | Admitting: Family Medicine

## 2017-06-25 ENCOUNTER — Other Ambulatory Visit (HOSPITAL_COMMUNITY)
Admission: RE | Admit: 2017-06-25 | Discharge: 2017-06-25 | Disposition: A | Discharge status: Home / Self Care | Payer: BLUE CROSS/BLUE SHIELD | Source: Ambulatory Visit | Attending: Family Medicine | Admitting: Family Medicine

## 2017-06-25 DIAGNOSIS — Z124 Encounter for screening for malignant neoplasm of cervix: Secondary | ICD-10-CM | POA: Insufficient documentation

## 2017-06-25 DIAGNOSIS — Z0000 Encounter for general adult medical examination without abnormal findings: Secondary | ICD-10-CM | POA: Diagnosis not present

## 2017-06-25 DIAGNOSIS — E7800 Pure hypercholesterolemia, unspecified: Secondary | ICD-10-CM | POA: Diagnosis not present

## 2017-06-25 DIAGNOSIS — Z23 Encounter for immunization: Secondary | ICD-10-CM | POA: Diagnosis not present

## 2017-06-30 LAB — CYTOLOGY - PAP
Diagnosis: NEGATIVE
HPV: NOT DETECTED

## 2017-09-01 DIAGNOSIS — I8002 Phlebitis and thrombophlebitis of superficial vessels of left lower extremity: Secondary | ICD-10-CM | POA: Diagnosis not present

## 2017-11-23 DIAGNOSIS — H04123 Dry eye syndrome of bilateral lacrimal glands: Secondary | ICD-10-CM | POA: Diagnosis not present

## 2017-11-23 DIAGNOSIS — H40033 Anatomical narrow angle, bilateral: Secondary | ICD-10-CM | POA: Diagnosis not present

## 2017-12-28 ENCOUNTER — Other Ambulatory Visit: Payer: Self-pay | Admitting: Family Medicine

## 2017-12-28 ENCOUNTER — Ambulatory Visit
Admission: RE | Admit: 2017-12-28 | Discharge: 2017-12-28 | Disposition: A | Discharge status: Home / Self Care | Payer: BLUE CROSS/BLUE SHIELD | Source: Ambulatory Visit | Attending: Family Medicine | Admitting: Family Medicine

## 2017-12-28 DIAGNOSIS — N6320 Unspecified lump in the left breast, unspecified quadrant: Secondary | ICD-10-CM | POA: Diagnosis not present

## 2017-12-28 DIAGNOSIS — N6002 Solitary cyst of left breast: Secondary | ICD-10-CM

## 2017-12-28 DIAGNOSIS — Z1231 Encounter for screening mammogram for malignant neoplasm of breast: Secondary | ICD-10-CM

## 2018-01-01 DIAGNOSIS — E7800 Pure hypercholesterolemia, unspecified: Secondary | ICD-10-CM | POA: Diagnosis not present

## 2018-01-04 ENCOUNTER — Other Ambulatory Visit: Payer: Self-pay | Admitting: Family Medicine

## 2018-01-04 DIAGNOSIS — N6002 Solitary cyst of left breast: Secondary | ICD-10-CM

## 2018-01-25 DIAGNOSIS — N390 Urinary tract infection, site not specified: Secondary | ICD-10-CM | POA: Diagnosis not present

## 2018-01-25 DIAGNOSIS — R300 Dysuria: Secondary | ICD-10-CM | POA: Diagnosis not present

## 2018-02-01 DIAGNOSIS — R350 Frequency of micturition: Secondary | ICD-10-CM | POA: Diagnosis not present

## 2018-04-30 ENCOUNTER — Telehealth: Payer: Self-pay | Admitting: Gastroenterology

## 2018-07-01 ENCOUNTER — Other Ambulatory Visit: Payer: BLUE CROSS/BLUE SHIELD

## 2018-08-17 ENCOUNTER — Emergency Department (HOSPITAL_COMMUNITY): Payer: Self-pay

## 2018-08-17 ENCOUNTER — Encounter (HOSPITAL_COMMUNITY): Payer: Self-pay | Admitting: Emergency Medicine

## 2018-08-17 ENCOUNTER — Emergency Department (HOSPITAL_COMMUNITY)
Admission: EM | Admit: 2018-08-17 | Discharge: 2018-08-17 | Disposition: A | Discharge status: Home / Self Care | Payer: Self-pay | Attending: Emergency Medicine | Admitting: Emergency Medicine

## 2018-08-17 DIAGNOSIS — W01198A Fall on same level from slipping, tripping and stumbling with subsequent striking against other object, initial encounter: Secondary | ICD-10-CM | POA: Insufficient documentation

## 2018-08-17 DIAGNOSIS — S8001XA Contusion of right knee, initial encounter: Secondary | ICD-10-CM | POA: Insufficient documentation

## 2018-08-17 DIAGNOSIS — I10 Essential (primary) hypertension: Secondary | ICD-10-CM | POA: Insufficient documentation

## 2018-08-17 DIAGNOSIS — Y9301 Activity, walking, marching and hiking: Secondary | ICD-10-CM | POA: Insufficient documentation

## 2018-08-17 DIAGNOSIS — S0101XA Laceration without foreign body of scalp, initial encounter: Secondary | ICD-10-CM

## 2018-08-17 DIAGNOSIS — Z7982 Long term (current) use of aspirin: Secondary | ICD-10-CM | POA: Insufficient documentation

## 2018-08-17 DIAGNOSIS — Y999 Unspecified external cause status: Secondary | ICD-10-CM | POA: Insufficient documentation

## 2018-08-17 DIAGNOSIS — S8000XA Contusion of unspecified knee, initial encounter: Secondary | ICD-10-CM

## 2018-08-17 DIAGNOSIS — Z79899 Other long term (current) drug therapy: Secondary | ICD-10-CM | POA: Insufficient documentation

## 2018-08-17 DIAGNOSIS — Y92481 Parking lot as the place of occurrence of the external cause: Secondary | ICD-10-CM | POA: Insufficient documentation

## 2018-08-17 DIAGNOSIS — S0990XA Unspecified injury of head, initial encounter: Secondary | ICD-10-CM

## 2018-08-17 DIAGNOSIS — S8002XA Contusion of left knee, initial encounter: Secondary | ICD-10-CM | POA: Insufficient documentation

## 2018-08-17 MED ORDER — TRAMADOL HCL 50 MG PO TABS: 50.0000 mg | ORAL_TABLET | Freq: Four times a day (QID) | ORAL | 0 refills | Status: DC | PRN

## 2018-08-17 MED ORDER — TRAMADOL HCL 50 MG PO TABS
50.0000 mg | ORAL_TABLET | Freq: Once | ORAL | Status: AC
  Administered 2018-08-17: 50 mg via ORAL
  Filled 2018-08-17: qty 1

## 2018-08-17 MED ORDER — TETANUS-DIPHTH-ACELL PERTUSSIS 5-2.5-18.5 LF-MCG/0.5 IM SUSP
0.5000 mL | Freq: Once | INTRAMUSCULAR | Status: AC
  Administered 2018-08-17: 14:00:00 0.5 mL via INTRAMUSCULAR
  Filled 2018-08-17: qty 0.5

## 2018-08-17 MED ORDER — LIDOCAINE-EPINEPHRINE (PF) 2 %-1:200000 IJ SOLN
20.0000 mL | Freq: Once | INTRAMUSCULAR | Status: AC
  Administered 2018-08-17: 20 mL
  Filled 2018-08-17: qty 20

## 2018-08-17 NOTE — ED Provider Notes (Addendum)
[facility] EMERGENCY DEPARTMENT Provider Note   CSN: [id number] Arrival date & time: 08/17/18  1233    History   Chief Complaint Chief Complaint  Patient presents with  . Fall    HPI Ann Graham is a 63 y.o. 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  Patient states that she was outside of her car and to go to an outdoor market.  She accidentally tripped and landed face first on concrete step.  She noted a large laceration of the right forehead area as well as pain on bilateral knees.  She also hit her chest as well.  Patient does not remember when her last tetanus shot was.  States that she is not on blood thinners.      The history is provided by the patient.    Past Medical History:  Diagnosis Date  . Anxiety   . 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 Elevated cholesterol   . Hx of cardiovascular stress test    Lexiscan Myoview 4/14:  No ischemia, EF 75%.  Marland Kitchen Hx of echocardiogram    Echo 3/14: EF 55-60%, Gr 1 DD, mild LAE  . Hypertension     Patient Active Problem List   Diagnosis Date Noted  . Diastolic dysfunction 57/84/6962  . Chest pain at rest 06/13/2012  . Hypokalemia 06/13/2012  . Cardiomegaly 06/13/2012  . HYPERLIPIDEMIA 08/12/2008  . OBESITY 08/12/2008  . HYPERTENSION 08/12/2008    Past Surgical History:  Procedure Laterality Date  . DILATION AND CURETTAGE OF UTERUS       OB History   No obstetric history on file.      Home Medications    Prior to Admission medications   Medication Sig Start Date End Date Taking? Authorizing Provider  aspirin EC 81 MG EC tablet Take 1 tablet (81 mg total) by mouth daily. 06/14/12   Oswald Hillock, MD  atorvastatin (LIPITOR) 20 MG tablet Take 20 mg by mouth daily.    [provider]  furosemide (LASIX) 20 MG tablet Take 20-40 mg by mouth daily. 06/14/12   Oswald Hillock, MD  traZODone (DESYREL) 50 MG tablet Take 50 mg by mouth at bedtime.     [provider]    Family History Family History  Problem Relation Age of Onset  . Heart disease Mother   . Heart disease Father   . Colon cancer Father   . Heart disease Brother     Social History Social History   Tobacco Use  . Smoking status: Never Smoker  . Smokeless tobacco: Never Used  Substance Use Topics  . Alcohol use: Yes    Comment: wine occasional  . Drug use: No     Allergies   Hydrocodone   Review of Systems Review of Systems  Skin: Positive for wound.  All other systems reviewed and are negative.    Physical Exam Updated Vital Signs BP (!) 168/89 (BP Location: Right Arm)   Pulse 77   Temp 97.7 F (36.5 C) (Oral)   Resp (!) 24   SpO2 97%   Physical Exam Vitals signs and nursing note reviewed.  HENT:     Head:     Comments: 4 cm R scalp laceration     Nose: Nose normal.     Mouth/Throat:     Mouth: Mucous membranes are moist.  Eyes:     Extraocular Movements: Extraocular movements intact.     Pupils: Pupils are  equal, round, and reactive to light.  Neck:     Musculoskeletal: Normal range of motion.     Comments: Mild R paracervical tenderness  Cardiovascular:     Rate and Rhythm: Normal rate and regular rhythm.     Comments: Mild reproducible chest wall tenderness, no obvious deformity  Pulmonary:     Effort: Pulmonary effort is normal.     Breath sounds: Normal breath sounds.  Abdominal:     General: Abdomen is flat.     Palpations: Abdomen is soft.  Musculoskeletal:     Comments: Bruising bilateral knees but able to range them. No obvious hip deformity. No midline spinal tenderness, pelvis stable   Skin:    General: Skin is warm.     Capillary Refill: Capillary refill takes less than 2 seconds.  Neurological:     General: No focal deficit present.     Mental Status: She is alert and oriented to person, place, and time.  Psychiatric:        Mood and Affect: Mood normal.        Behavior: Behavior normal.      ED  Treatments / Results  Labs (all labs ordered are listed, but only abnormal results are displayed) Labs Reviewed - No data to display  EKG None  Radiology No results found.  Procedures Procedures (including critical care time) LACERATION REPAIR Performed by: Wandra Arthurs Authorized by: Wandra Arthurs Consent: Verbal consent obtained. Risks and benefits: risks, benefits and alternatives were discussed Consent given by: patient Patient identity confirmed: provided demographic data Prepped and Draped in normal sterile fashion Wound explored  Laceration Location: R scalp  Laceration Length: 4 cm  No Foreign Bodies seen or palpated  Anesthesia: local infiltration  Local anesthetic: lidocaine 2 % with epinephrine  Anesthetic total: 10 ml  Irrigation method: syringe Amount of cleaning: standard  Skin closure: multi layer- subcutaneous and superficial  Number of sutures: 3 5-0 vicryl subcutaneous and 6 5-0 ethilon simple interrupted   Technique: see above   Patient tolerance: Patient tolerated the procedure well with no immediate complications.   Medications Ordered in ED Medications  Tdap (BOOSTRIX) injection 0.5 mL (has no administration in time range)  traMADol (ULTRAM) tablet 50 mg (has no administration in time range)  lidocaine-EPINEPHrine (XYLOCAINE W/EPI) 2 %-1:200000 (PF) injection 20 mL (has no administration in time range)     Initial Impression / Assessment and Plan / ED Course  I have reviewed the triage vital signs and the nursing notes.  Pertinent labs & imaging results that were available during my care of the patient were reviewed by me and considered in my medical decision making (see chart for details).       Ann Graham is a 63 y.o. female here with fall. Has R scalp laceration. Will get CT head/neck, xrays. Will update tdap and suture laceration. Neurovascular intact.   3:17 PM Laceration sutured. xrays and CT scans unremarkable. Ambulated  well. Stable for discharge. Will give tramadol as needed.    Final Clinical Impressions(s) / ED Diagnoses   Final diagnoses:  None    ED Discharge Orders    None       Drenda Freeze, MD 08/17/18 1518    Drenda Freeze, MD 08/17/18 [phone]

## 2018-08-17 NOTE — ED Notes (Signed)
Patient back from CT.

## 2018-08-17 NOTE — Discharge Instructions (Signed)
Take tylenol for pain   Take tramadol as prescribed for severe pain   See your doctor  Suture removal in a week   Expect your face to be black and blue   Return to ER if you have another fall, severe pain, vomiting, uncontrolled headaches.

## 2018-08-17 NOTE — ED Triage Notes (Signed)
Pt was walking into a building up concrete steps and pt states she tripped over the first step and fell off to the side. Pt states she remembers falling and see a lot of blood so she laid there until help arrived. Pt has a large laceration above her right eye, abrasions on right side of face and bilateral knees.  Pt states she has a headache and left sided neck pain- per ems pt would not tolerate the c collar and requested it be removed. Pt is alert and ox4.

## 2018-08-17 NOTE — ED Notes (Signed)
Pt transported to radiology.

## 2018-08-17 NOTE — ED Notes (Signed)
Pt verbalized understanding of discharge instructions and denies any further questions at this time.   

## 2018-08-17 NOTE — ED Notes (Signed)
Patient transported to X-ray 

## 2018-08-21 ENCOUNTER — Encounter (HOSPITAL_COMMUNITY): Payer: Self-pay

## 2018-08-21 ENCOUNTER — Other Ambulatory Visit: Payer: Self-pay

## 2018-08-21 ENCOUNTER — Emergency Department (HOSPITAL_COMMUNITY): Payer: Self-pay

## 2018-08-21 ENCOUNTER — Emergency Department (HOSPITAL_COMMUNITY)
Admission: EM | Admit: 2018-08-21 | Discharge: 2018-08-21 | Disposition: A | Discharge status: Home / Self Care | Payer: Self-pay | Attending: Emergency Medicine | Admitting: Emergency Medicine

## 2018-08-21 DIAGNOSIS — G8911 Acute pain due to trauma: Secondary | ICD-10-CM | POA: Insufficient documentation

## 2018-08-21 DIAGNOSIS — Z7982 Long term (current) use of aspirin: Secondary | ICD-10-CM | POA: Insufficient documentation

## 2018-08-21 DIAGNOSIS — W1830XD Fall on same level, unspecified, subsequent encounter: Secondary | ICD-10-CM | POA: Insufficient documentation

## 2018-08-21 DIAGNOSIS — R410 Disorientation, unspecified: Secondary | ICD-10-CM | POA: Insufficient documentation

## 2018-08-21 DIAGNOSIS — I10 Essential (primary) hypertension: Secondary | ICD-10-CM | POA: Insufficient documentation

## 2018-08-21 DIAGNOSIS — R51 Headache: Secondary | ICD-10-CM | POA: Insufficient documentation

## 2018-08-21 DIAGNOSIS — Z79899 Other long term (current) drug therapy: Secondary | ICD-10-CM | POA: Insufficient documentation

## 2018-08-21 DIAGNOSIS — R519 Headache, unspecified: Secondary | ICD-10-CM

## 2018-08-21 DIAGNOSIS — S0181XD Laceration without foreign body of other part of head, subsequent encounter: Secondary | ICD-10-CM | POA: Insufficient documentation

## 2018-08-21 LAB — CBC WITH DIFFERENTIAL/PLATELET
Abs Immature Granulocytes: 0.03 10*3/uL (ref 0.00–0.07)
Basophils Absolute: 0.1 10*3/uL (ref 0.0–0.1)
Basophils Relative: 1 %
Eosinophils Absolute: 0.1 10*3/uL (ref 0.0–0.5)
Eosinophils Relative: 2 %
HCT: 39 % (ref 36.0–46.0)
Hemoglobin: 12.7 g/dL (ref 12.0–15.0)
Immature Granulocytes: 0 %
Lymphocytes Relative: 20 %
Lymphs Abs: 1.5 10*3/uL (ref 0.7–4.0)
MCH: 27.3 pg (ref 26.0–34.0)
MCHC: 32.6 g/dL (ref 30.0–36.0)
MCV: 83.9 fL (ref 80.0–100.0)
Monocytes Absolute: 0.5 10*3/uL (ref 0.1–1.0)
Monocytes Relative: 7 %
Neutro Abs: 5.4 10*3/uL (ref 1.7–7.7)
Neutrophils Relative %: 70 %
Platelets: 303 10*3/uL (ref 150–400)
RBC: 4.65 MIL/uL (ref 3.87–5.11)
RDW: 14.1 % (ref 11.5–15.5)
WBC: 7.6 10*3/uL (ref 4.0–10.5)
nRBC: 0 % (ref 0.0–0.2)

## 2018-08-21 LAB — BASIC METABOLIC PANEL
Anion gap: 11 (ref 5–15)
BUN: 18 mg/dL (ref 8–23)
CO2: 24 mmol/L (ref 22–32)
Calcium: 9.2 mg/dL (ref 8.9–10.3)
Chloride: 104 mmol/L (ref 98–111)
Creatinine, Ser: 0.64 mg/dL (ref 0.44–1.00)
GFR calc Af Amer: >60 mL/min (ref >60)
GFR calc non Af Amer: >60 mL/min (ref >60)
Glucose, Bld: 104 mg/dL — ABNORMAL HIGH (ref 70–99)
Potassium: 3.6 mmol/L (ref 3.5–5.1)
Sodium: 139 mmol/L (ref 135–145)

## 2018-08-21 LAB — PROTIME-INR
INR: 1 (ref 0.8–1.2)
Prothrombin Time: 13.2 seconds (ref 11.4–15.2)

## 2018-08-21 LAB — MAGNESIUM: Magnesium: 2.3 mg/dL (ref 1.7–2.4)

## 2018-08-21 MED ORDER — KETOROLAC TROMETHAMINE 15 MG/ML IJ SOLN
15.0000 mg | Freq: Once | INTRAMUSCULAR | Status: AC
  Administered 2018-08-21: 15 mg via INTRAVENOUS
  Filled 2018-08-21: qty 1

## 2018-08-21 MED ORDER — DIPHENHYDRAMINE HCL 50 MG/ML IJ SOLN
12.5000 mg | Freq: Once | INTRAMUSCULAR | Status: AC
  Administered 2018-08-21: 12.5 mg via INTRAVENOUS
  Filled 2018-08-21: qty 1

## 2018-08-21 MED ORDER — PROCHLORPERAZINE EDISYLATE 10 MG/2ML IJ SOLN
10.0000 mg | Freq: Once | INTRAMUSCULAR | Status: AC
  Administered 2018-08-21: 10 mg via INTRAVENOUS
  Filled 2018-08-21: qty 2

## 2018-08-21 MED ORDER — LACTATED RINGERS IV BOLUS
1000.0000 mL | Freq: Once | INTRAVENOUS | Status: AC
  Administered 2018-08-21: 1000 mL via INTRAVENOUS

## 2018-08-21 NOTE — ED Triage Notes (Signed)
Pt from home with ems for headache, feeling confused/disoriented since being discharged on Tuesday post fall. Pt has laceration to forehead from fall with stitches in place. Pt initially hypertensive 212 palpated with ems. Pt arrives to ED alert and answering all questions appropriately.

## 2018-08-21 NOTE — Discharge Instructions (Addendum)

## 2018-08-21 NOTE — ED Provider Notes (Signed)
[facility] EMER[facility] DEPARTMENT Provider Note   CSN: [id number] Arrival date & time: 08/21/18  1622    History   Chief Complaint Chief Complaint  Patient presents with  . Headache  . Altered Mental Status    HPI Ann Graham is a 63 y.o. female.     The history is provided by the patient and medical records.  Headache  Pain location:  Generalized Quality:  Dull Radiates to:  Does not radiate Severity currently:  6/10 Severity at highest:  9/10 Onset quality:  Gradual Duration:  4 days Timing:  Intermittent Progression:  Waxing and waning Chronicity:  New Similar to prior headaches: no   Context: activity and emotional stress   Context: not caffeine, not coughing, not eating and not loud noise   Relieved by:  Nothing Worsened by:  Activity Ineffective treatments:  Resting in a darkened room and acetaminophen (tramadol) Associated symptoms: facial pain   Associated symptoms: no blurred vision, no cough, no dizziness, no fever, no focal weakness, no loss of balance, no numbness, no paresthesias, no seizures, no syncope and no vomiting   Associated symptoms comment:  "zoning out", feeling "spaced out", not feeling like herself Risk factors: no anger and no family hx of SAH   Altered Mental Status  Associated symptoms: headaches   Associated symptoms: no fever, no seizures and no vomiting     Past Medical History:  Diagnosis Date  . Anxiety   . 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 Elevated cholesterol   . Hx of cardiovascular stress test    Lexiscan Myoview 4/14:  No ischemia, EF 75%.  Marland Kitchen Hx of echocardiogram    Echo 3/14: EF 55-60%, Gr 1 DD, mild LAE  . Hypertension     Patient Active Problem List   Diagnosis Date Noted  . Diastolic dysfunction 07/62/2633  . Chest pain at rest 06/13/2012  . Hypokalemia 06/13/2012  . Cardiomegaly 06/13/2012  . HYPERLIPIDEMIA 08/12/2008  . OBESITY 08/12/2008  . HYPERTENSION 08/12/2008    Past Surgical  History:  Procedure Laterality Date  . DILATION AND CURETTAGE OF UTERUS       OB History   No obstetric history on file.      Home Medications    Prior to Admission medications   Medication Sig Start Date End Date Taking? Authorizing Provider  aspirin EC 81 MG EC tablet Take 1 tablet (81 mg total) by mouth daily. 06/14/12   Oswald Hillock, MD  atorvastatin (LIPITOR) 20 MG tablet Take 20 mg by mouth daily.    [provider]  furosemide (LASIX) 20 MG tablet Take 20-40 mg by mouth daily. 06/14/12   Oswald Hillock, MD  traMADol (ULTRAM) 50 MG tablet Take 1 tablet (50 mg total) by mouth every 6 (six) hours as needed. 08/17/18   Drenda Freeze, MD  traZODone (DESYREL) 50 MG tablet Take 50 mg by mouth at bedtime.    [provider]    Family History Family History  Problem Relation Age of Onset  . Heart disease Mother   . Heart disease Father   . Colon cancer Father   . Heart disease Brother     Social History Social History   Tobacco Use  . Smoking status: Never Smoker  . Smokeless tobacco: Never Used  Substance Use Topics  . Alcohol use: Yes    Comment: wine occasional  . Drug use: No     Allergies   Hydrocodone and Lisinopril  Review of Systems Review of Systems  Constitutional: Negative for fever.  Eyes: Negative for blurred vision.  Respiratory: Negative for cough.   Cardiovascular: Negative for syncope.  Gastrointestinal: Negative for vomiting.  Neurological: Positive for headaches. Negative for dizziness, focal weakness, seizures, numbness, paresthesias and loss of balance.  All other systems reviewed and are negative.    Physical Exam Updated Vital Signs BP (!) 172/83   Pulse 71   Temp 97.9 F (36.6 C) (Oral)   Resp 16   SpO2 97%   Physical Exam Vitals signs and nursing note reviewed.  Constitutional:      Appearance: She is well-developed.  HENT:     Head: Normocephalic.     Comments: Sutures in place right forehead,  healing s/p laceration  Eyes:     Extraocular Movements: Extraocular movements intact.     Right eye: Normal extraocular motion.     Left eye: Normal extraocular motion.     Conjunctiva/sclera: Conjunctivae normal.     Pupils: Pupils are equal, round, and reactive to light.  Neck:     Musculoskeletal: Normal range of motion and neck supple. No neck rigidity.  Cardiovascular:     Rate and Rhythm: Normal rate.  Pulmonary:     Effort: Pulmonary effort is normal. No respiratory distress.     Breath sounds: Normal breath sounds. No stridor. No wheezing or rhonchi.  Abdominal:     Palpations: Abdomen is soft.     Tenderness: There is no abdominal tenderness.  Skin:    General: Skin is warm and dry.  Neurological:     Mental Status: She is alert and oriented to person, place, and time.     GCS: GCS eye subscore is 4. GCS verbal subscore is 5. GCS motor subscore is 6.     Cranial Nerves: No dysarthria.     Comments:  5/5 motor strength bilateral upper extremities  5/5 motor strength bilateral lower extremities  No sensory deficits bilateral upper extremities, bilateral lower extremities  2+ pulses throughout  Normal finger-to-nose testing bilateral upper extremities  Normal heel-to-shin testing bilateral lower extremities  Grossly normal visual acuity, No obvious visual field cuts bilaterally. grossly normal extraocular movements on H testing  Cranial nerves II through XII grossly intact  CN 2 (Optic): Visual fields intact to confrontation  CN 3,4,6 (EOM): Pupils equal and reactive to light. Full extraocular eye movement without nystagmus.   CN 5 (Trigeminal): Facial sensation is normal, no weakness of masticatory muscles.  CN 7 (Facial): No obvious facial droop  CN 8 (Auditory): Auditory acuity grossly normal.  CN 9,10 (Glossophar): The uvula is midline, the palate elevates symmetrically.  CN 11 (spinal access): Head midline  CN 12 (Hypoglossal): The tongue is midline.        ED Treatments / Results  Labs (all labs ordered are listed, but only abnormal results are displayed) Labs Reviewed  BASIC METABOLIC PANEL - Abnormal; Notable for the following components:      Result Value   Glucose, Bld 104 (*)    All other components within normal limits  CBC WITH DIFFERENTIAL/PLATELET  PROTIME-INR  MAGNESIUM    EKG None  Radiology Ct Head Wo Contrast  Result Date: 08/21/2018 CLINICAL DATA:  Fall 4 days ago with persistent headache and confusion. Forehead laceration. EXAM: CT HEAD WITHOUT CONTRAST TECHNIQUE: Contiguous axial images were obtained from the base of the skull through the vertex without intravenous contrast. COMPARISON:  08/17/2018 FINDINGS: Brain: Ventricles, cisterns and other CSF spaces  are within normal. No mass, mass effect, shift of midline structures or acute hemorrhage. No evidence of acute infarction. Vascular: No hyperdense vessel or unexpected calcification. Skull: Normal. Negative for fracture or focal lesion. Sinuses/Orbits: No acute finding. Other: Right frontal scalp contusion unchanged. IMPRESSION: No acute brain injury. Right frontal scalp contusion unchanged. Electronically Signed   By: Marin Olp M.D.   On: 08/21/2018 18:02    Procedures Procedures (including critical care time)  Medications Ordered in ED Medications  lactated ringers bolus 1,000 mL (1,000 mLs Intravenous New Bag/Given 08/21/18 1723)  ketorolac (TORADOL) 15 MG/ML injection 15 mg (15 mg Intravenous Given 08/21/18 1718)  prochlorperazine (COMPAZINE) injection 10 mg (10 mg Intravenous Given 08/21/18 1720)  diphenhydrAMINE (BENADRYL) injection 12.5 mg (12.5 mg Intravenous Given 08/21/18 1716)     Initial Impression / Assessment and Plan / ED Course  I have reviewed the triage vital signs and the nursing notes.  Pertinent labs & imaging results that were available during my care of the patient were reviewed by me and considered in my medical decision making (see  chart for details).        Medical Decision Making: Samia Kukla is a 63 y.o. female who presented to the ED today with headache, transient headache, transient episodes of confusion, feeling "spaced out".  Past med episodes of confusionical history significant for hypertension, hyperlipidemia, anxiety, Budd-Chiari syndrome Reviewed and confirmed nursing documentation for past medical history, family history, social history.  On my initial exam, the pt was calm, alert and interactive, GCS 15, conversant, not tachycardic, not hypotensive, afebrile.   Fall with trauma to head on concrete step a few days ago, seen here in ED, laceration to right forehead, CT scan of head and neck negative, laceration repaired, patient has had intermittent headaches, "zoning out" symptoms intermittently, daughter concerned so called EMS.  We will treat patient symptomatically, obtain repeat head imaging, basic labs.  Etiology could be secondary to postconcussive syndrome.  Other causes of the neurological findings were considered, and include infectious causes (encephalopathy, meningitis), tumor/abscess, toxic causes (hypoglycemia, renal failure, hepatic failure, toxic ingestion), other neurologic disorders (seizure, migraine, Todd paralysis, GBS, status epilepticus, BPPV, disc herniation, diabetic neuropathy), psychiatric causes (conversion) and trauma (ICH, spinal injury). CT imaging revealed no acute abnormalities. CBC, BMP and coags reassuring. EKG (my interpretation):      NS rhythm with a rate of  67.      QRS 107. QTc 466.      No acute ischemic ST-T segment changes.      No acute changes suggestive of hyperkalemia.      No WPW, LQTS, or Brugada's Syndrome. When compared to previous ECGs from 09/02/12 change no new concerning changes found.  All radiology and laboratory studies reviewed independently and with my attending physician, agree with reading provided by radiologist unless otherwise noted.   Upon reassessing patient, patient was calm, resting comfortably, headache resolved after medications. Based on the above findings, I believe patient is hemodynamically stable for discharge  Patient educated about specific return precautions for given chief complaint and symptoms.  Patient educated about follow-up with PCP.  Patien expressed understanding of return precautions and need for follow-up.  Patient discharged. The above care was discussed with and agreed upon by my attending physician.  Emergency Department Medication Summary:  Medications  lactated ringers bolus 1,000 mL (1,000 mLs Intravenous New Bag/Given 08/21/18 1723)  ketorolac (TORADOL) 15 MG/ML injection 15 mg (15 mg Intravenous Given 08/21/18 1718)  prochlorperazine (  COMPAZINE) injection 10 mg (10 mg Intravenous Given 08/21/18 1720)  diphenhydrAMINE (BENADRYL) injection 12.5 mg (12.5 mg Intravenous Given 08/21/18 1716)    Final Clinical Impressions(s) / ED Diagnoses   Final diagnoses:  Bad headache    ED Discharge Orders    None       Lonzo Candy, MD 08/21/18 [id number]    Elnora Morrison, MD 08/22/18 [phone]

## 2018-09-30 ENCOUNTER — Ambulatory Visit
Admission: RE | Admit: 2018-09-30 | Discharge: 2018-09-30 | Disposition: A | Discharge status: Home / Self Care | Payer: Medicare Other | Source: Ambulatory Visit | Attending: Family Medicine | Admitting: Family Medicine

## 2018-09-30 ENCOUNTER — Other Ambulatory Visit: Payer: Self-pay

## 2018-09-30 DIAGNOSIS — N6002 Solitary cyst of left breast: Secondary | ICD-10-CM

## 2018-10-28 ENCOUNTER — Telehealth: Payer: Self-pay | Admitting: Gastroenterology

## 2018-10-28 NOTE — Telephone Encounter (Deleted)
Hi Dr. Fuller Plan, this patient is being referred for a repeat colon. He used to see you but transferred her care to [facility] in 2017, she states that her insurance changed so she had to change GI MD. She has a new insurance now and would like to transfer back. Her records from [facility] are in [place]. Would you take this patient back? Thank you.

## 2019-09-20 ENCOUNTER — Other Ambulatory Visit: Payer: Self-pay | Admitting: Family Medicine

## 2019-09-20 DIAGNOSIS — Z1231 Encounter for screening mammogram for malignant neoplasm of breast: Secondary | ICD-10-CM

## 2019-10-06 ENCOUNTER — Ambulatory Visit
Admission: RE | Admit: 2019-10-06 | Discharge: 2019-10-06 | Disposition: A | Discharge status: Home / Self Care | Payer: Medicare Other | Source: Ambulatory Visit | Attending: Family Medicine | Admitting: Family Medicine

## 2019-10-06 ENCOUNTER — Other Ambulatory Visit: Payer: Self-pay

## 2019-10-06 DIAGNOSIS — Z1231 Encounter for screening mammogram for malignant neoplasm of breast: Secondary | ICD-10-CM

## 2019-10-24 ENCOUNTER — Other Ambulatory Visit: Payer: Self-pay | Admitting: General Surgery

## 2020-02-17 ENCOUNTER — Ambulatory Visit: Payer: Medicare Other | Attending: Family Medicine | Admitting: Physical Therapy

## 2020-02-17 ENCOUNTER — Other Ambulatory Visit: Payer: Self-pay

## 2020-02-17 ENCOUNTER — Encounter: Payer: Self-pay | Admitting: Physical Therapy

## 2020-02-17 DIAGNOSIS — M6281 Muscle weakness (generalized): Secondary | ICD-10-CM | POA: Diagnosis present

## 2020-02-17 DIAGNOSIS — R293 Abnormal posture: Secondary | ICD-10-CM | POA: Diagnosis present

## 2020-02-17 NOTE — Therapy (Signed)
[facility] [address] [place], [place], [address] Phone: [phone]   Fax:  [phone]  Physical Therapy Evaluation  Patient Details  Name: Ann Graham MRN: [id number] Date of Birth: 05/28/1955 Referring Provider (PT): Kristen Loader, FNP   Encounter Date: 02/17/2020   PT End of Session - 02/17/20 1244    Visit Number 1    Date for PT Re-Evaluation 05/11/20    PT Start Time 1100    PT Stop Time 1143    PT Time Calculation (min) 43 min    Activity Tolerance Patient tolerated treatment well    Behavior During Therapy [facility] for tasks assessed/performed           Past Medical History:  Diagnosis Date   Anxiety    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   Hx of echocardiogram    Echo 3/14: EF 55-60%, Gr 1 DD, mild LAE   Hypertension     Past Surgical History:  Procedure Laterality Date   DILATION AND CURETTAGE OF UTERUS      There were no vitals filed for this visit.    Subjective Assessment - 02/17/20 1106    Subjective It has gotten worse over the last 6 months.  I am now using depends all the time and waking up 2x/ night.  I feel like when I have to go I have to go fast.    Patient Stated Goals Not have to wear pads/depends    Currently in Pain? No/denies              [facility] PT Assessment - 02/17/20 0001      Assessment   Medical Diagnosis N39.498 (ICD-10-CM) - Other specified urinary incontinence    Referring Provider (PT) Kristen Loader, FNP    Onset Date/Surgical Date --   6 months   Prior Therapy No      Precautions   Precautions None      Balance Screen   Has the patient fallen in the past 6 months Yes    How many times? I have fallen tripping up steps      [place] residence    Living Arrangements Alone      Prior Function   Level  of [place] Retired    Leisure reading      Cognition   Overall Cognitive Status Within Functional Limits for tasks assessed      Posture/Postural Control   Posture/Postural Control Postural limitations    Postural Limitations Flexed trunk;Anterior pelvic tilt;Weight shift right;Rounded Shoulders      ROM / Strength   AROM / PROM / Strength PROM;Strength      PROM   Overall PROM Comments hip rotation 75%      Strength   Overall Strength Comments core weakness with tenting upper abdomen during bed mob; hip fleixon Lt 4-/5      Flexibility   Soft Tissue Assessment /Muscle Length yes    Hamstrings 80% bilat      Palpation   Palpation comment tight hamstrings, glutes, hip flexors, tenting of abdomen with supine<> sit above umbilicus, deccreased ribcage movement      Ambulation/Gait   Gait Pattern Trunk rotated posteriorly on right;Lateral trunk lean to right   history of Lt back pain  Objective measurements completed on examination: See above findings.     Pelvic Floor Special Questions - 02/17/20 0001    Prior Pelvic/Prostate Exam Yes    Prior Pregnancies Yes    Number of Pregnancies 2    Number of Vaginal Deliveries 2    Episiotomy Performed Yes    Currently Sexually Active No    Urinary Leakage Yes    How often not sure    Pad use depends 2/day    Activities that cause leaking With strong urge    Urinary urgency Yes    Urinary frequency nocturia 2x/night    Fluid intake 2 bottles/day    Falling out feeling (prolapse) No    Skin Integrity Intact    Prolapse None    Pelvic Floor Internal Exam pt identity confimred and informed consent to perform internal STM    Exam Type Vaginal    Palpation stiff levators Lt side    Strength weak squeeze, no lift    Strength # of reps 4    Strength # of seconds 2    Tone mixed - higher on Lt side            [facility] Adult PT Treatment/Exercise - 02/17/20 0001       Self-Care   Self-Care Other Self-Care Comments    Other Self-Care Comments  urge to void                  PT Education - 02/17/20 1252    Education Details urge handout    Person(s) Educated Patient    Methods Explanation;Demonstration;Handout    Comprehension Verbalized understanding            PT Short Term Goals - 02/17/20 1246      PT SHORT TERM GOAL #1   Title Pt will be ind with initial HEP and urge techniques    Time 4    Period Weeks    Status New    Target Date 03/16/20      PT SHORT TERM GOAL #2   Title Pt will be albe to contract and hold pelvic floor for at least 10 sec    Time 6    Period Weeks    Status New    Target Date 03/30/20             PT Long Term Goals - 02/17/20 1244      PT LONG TERM GOAL #1   Title Pt will report no need for depends    Time 12    Period Weeks    Status New    Target Date 05/11/20      PT LONG TERM GOAL #2   Title Pt will demonstrate kegel and ability to hold for at least 15 seconds for reduced leakage throughout the day    Baseline 2 sec    Time 12    Period Weeks    Status New    Target Date 05/11/20      PT LONG TERM GOAL #3   Title Pt will report 50% reduction in urge to void    Time 12    Period Weeks    Status New    Target Date 05/11/20      PT LONG TERM GOAL #4   Title Pt will be ind with HEP    Time 12    Period Weeks    Status New    Target Date 05/11/20  Plan - 02/17/20 1200    Clinical Impression Statement Pt presents to skilled PT due to urinary leakage that has increased over the last 6 months.  Pt has history of back pain and demosntrates posture abnormatlites as mentioned above.  Pt has MMT 2/5 and some stiffness of levators Lt side of the pelvic floor.  Pt only able to engage and hold for 2 seconds and able to complete 4 reps.  Pt has some tenting of abdomen demonstrating core weakness with supine<>sit.  Pt has some LE weakness as mentioned. Pt will benefit  from skilled PT to address impairments and return to maximum function without leakage.    Personal Factors and Comorbidities Comorbidity 2;Age;Time since onset of injury/illness/exacerbation    Comorbidities low back pain; hx of episiotomy    Examination-Activity Limitations Toileting;Continence    Examination-Participation Restrictions Community Activity    Stability/Clinical Decision Making Evolving/Moderate complexity    Clinical Decision Making Moderate    Rehab Potential Excellent    PT Frequency 1x / week    PT Duration 12 weeks    PT Treatment/Interventions ADLs/Self Care Home Management;Biofeedback;Cryotherapy;Software engineer;Therapeutic activities;Therapeutic exercise;Neuromuscular re-education;Wheelchair mobility training;Manual techniques;Taping;Energy conservation;Dry needling;Passive range of motion;Patient/family education    PT Next Visit Plan review kegel with breathing, core brace, h/s and hip stretches, f/u on urge techniques    PT Home Exercise Plan urge techniuqes    Consulted and Agree with Plan of Care Patient           Patient will benefit from skilled therapeutic intervention in order to improve the following deficits and impairments:  Abnormal gait, Pain, Postural dysfunction, Impaired flexibility, Increased fascial restricitons, Decreased strength, Decreased coordination, Decreased range of motion  Visit Diagnosis: Muscle weakness (generalized)  Abnormal posture     Problem List Patient Active Problem List   Diagnosis Date Noted   Diastolic dysfunction 62/83/1517   Chest pain at rest 06/13/2012   Hypokalemia 06/13/2012   Cardiomegaly 06/13/2012   HYPERLIPIDEMIA 08/12/2008   OBESITY 08/12/2008   HYPERTENSION 08/12/2008    Camillo Flaming Thula Stewart, PT 02/17/2020, 12:52 PM  [facility] [address] [place], [place], [address] Phone: [phone]   Fax:   [phone]  Name: Wessie Shanks MRN: [id number] Date of Birth: [birth year]

## 2020-03-08 ENCOUNTER — Ambulatory Visit: Payer: Medicare Other | Admitting: Physical Therapy

## 2020-05-30 ENCOUNTER — Ambulatory Visit: Payer: Medicare Other | Admitting: Physical Therapy

## 2020-06-19 DIAGNOSIS — R339 Retention of urine, unspecified: Secondary | ICD-10-CM | POA: Insufficient documentation

## 2020-06-20 IMAGING — CT CT CERVICAL SPINE WITHOUT CONTRAST
3 of 4 series · 13 of 33 positions shown, 16 images · non-contrast
Comparison: None.

CLINICAL DATA: Head laceration after fall down stairs.

EXAM:
CT HEAD WITHOUT CONTRAST
CT MAXILLOFACIAL WITHOUT CONTRAST
CT CERVICAL SPINE WITHOUT CONTRAST
TECHNIQUE: Multidetector CT imaging of the head, cervical spine, and
maxillofacial structures were performed using the standard protocol
without intravenous contrast. Multiplanar CT image reconstructions
of the cervical spine and maxillofacial structures were also
generated.

[Series 6: cor bone · coronal · 0.30mm/px · 3 of 69 slices shown]
[im 14/69  bone]
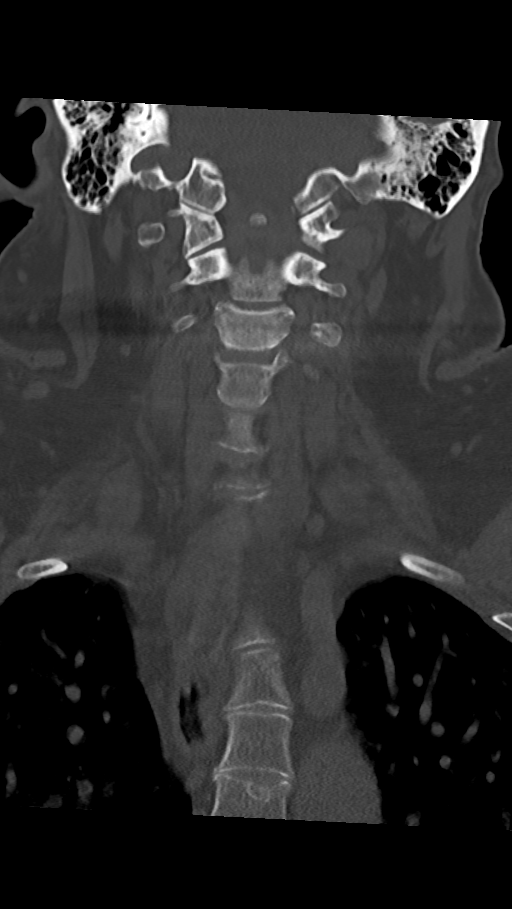
[im 28/69  bone]
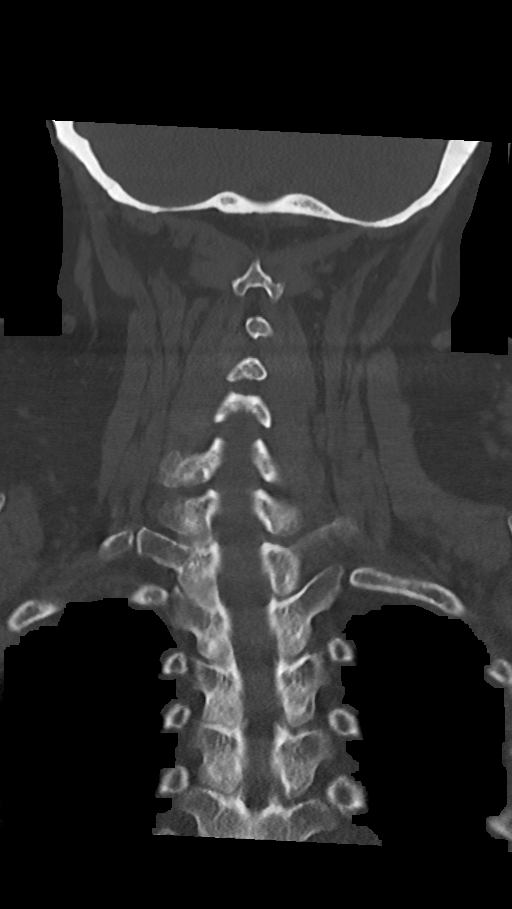
[im 41/69  bone]
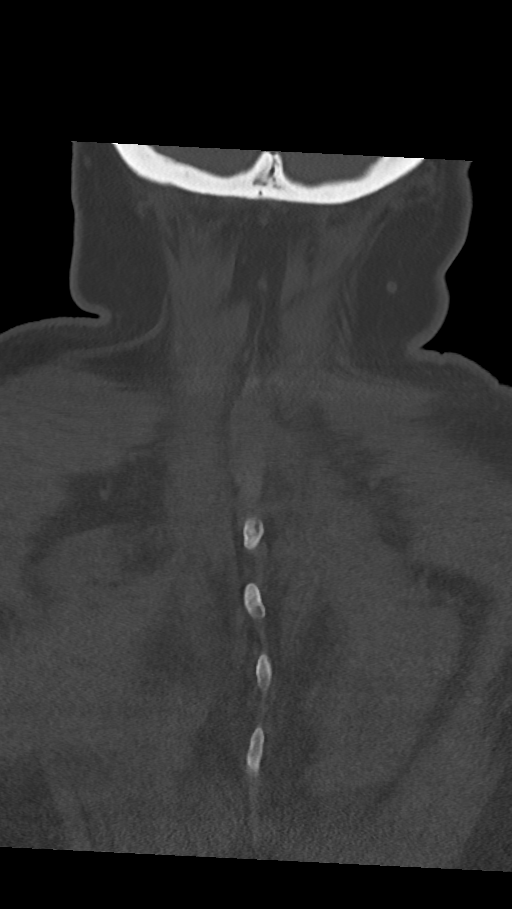

[Series 7: sag bone · sagittal · 0.41mm/px · 5 of 61 slices shown, 6 images]
[im 21/61  bone]
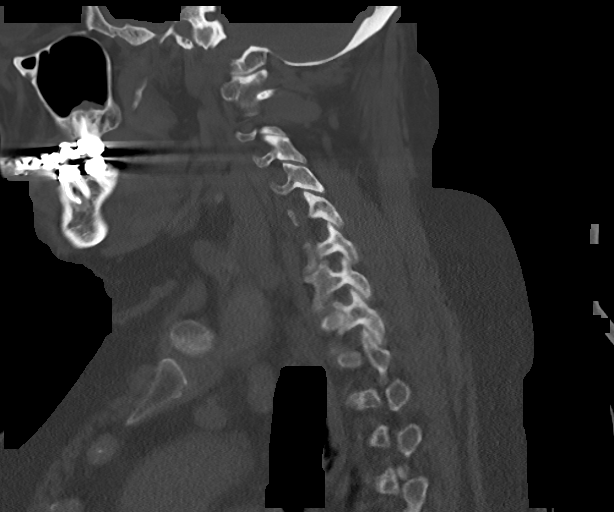
[im 26/61  bone]
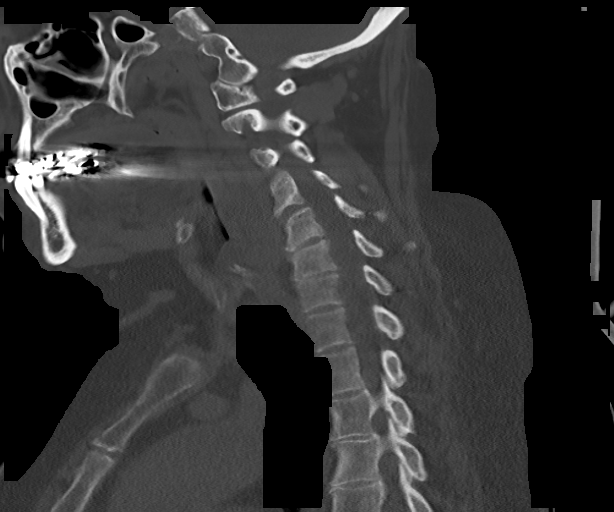
[im 31/61  soft-tissue]
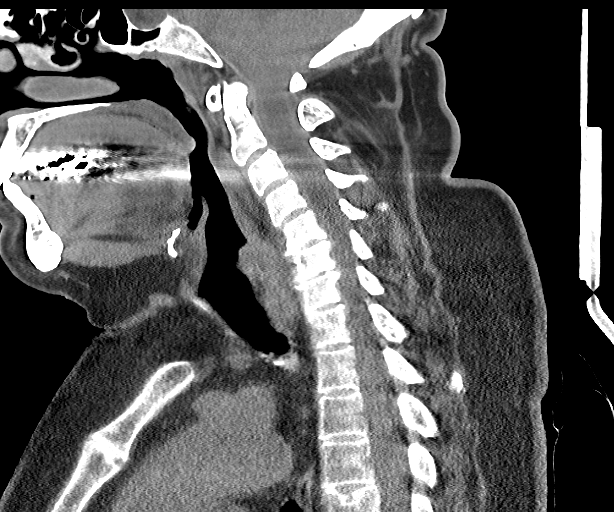
[im 31/61  bone]
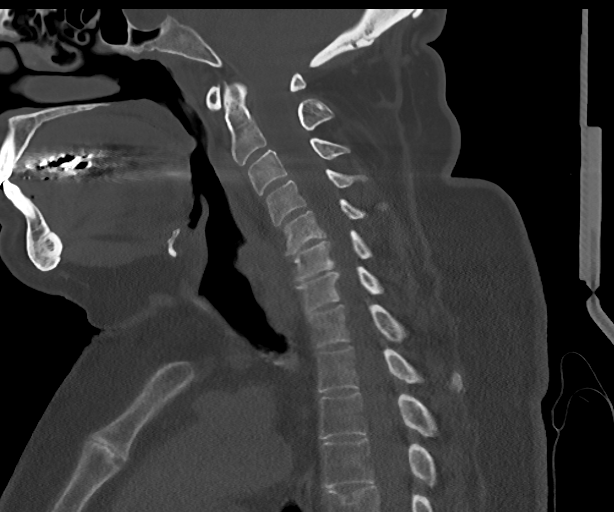
[im 36/61  bone]
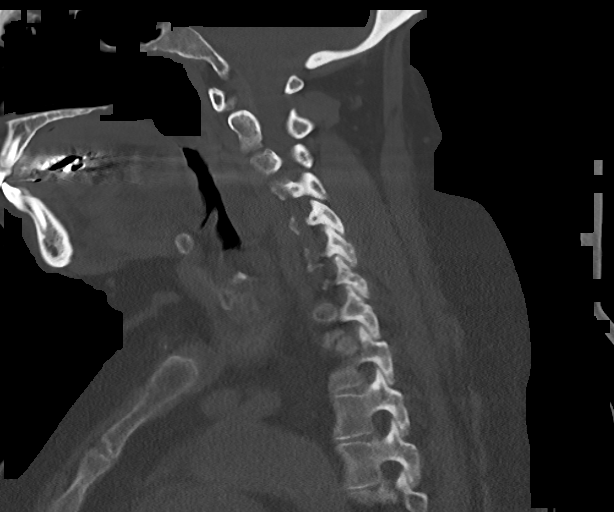
[im 41/61  bone]
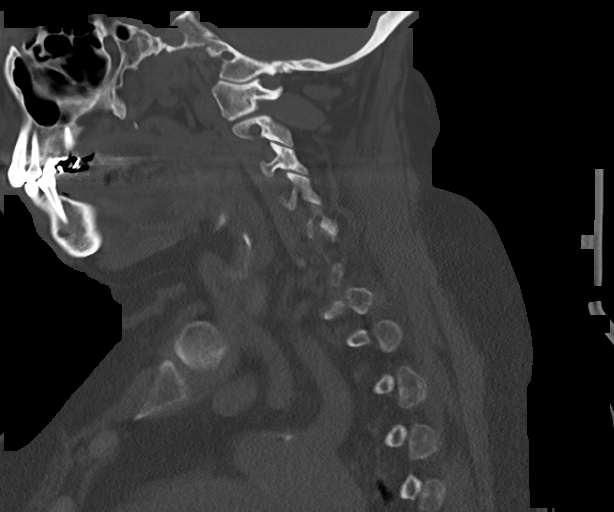

[Series 8: orthogonal axials · axial · 0.21mm/px · z∈[-261,-140]mm · 5 of 109 slices shown, 7 images]
[im 19/109  soft-tissue]
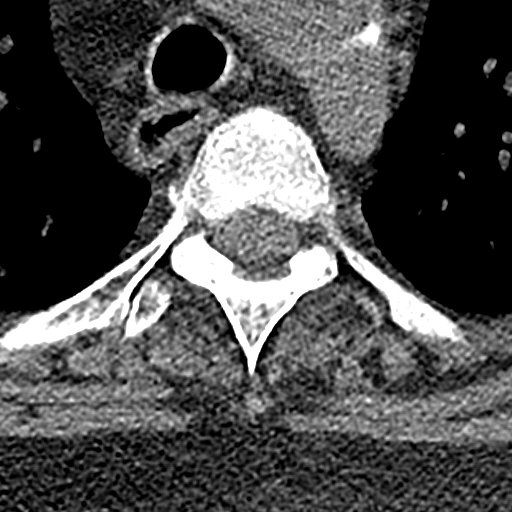
[im 19/109  bone]
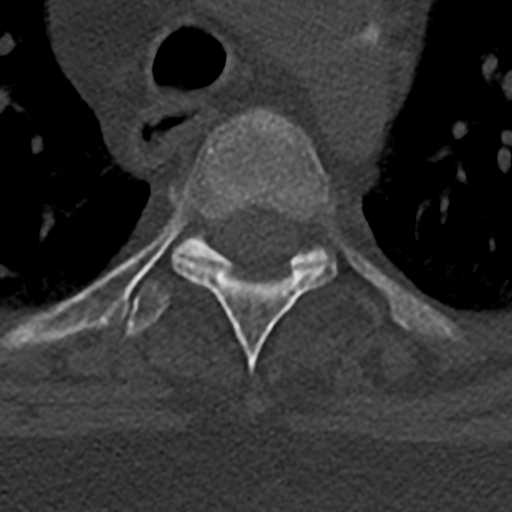
[im 37/109  bone]
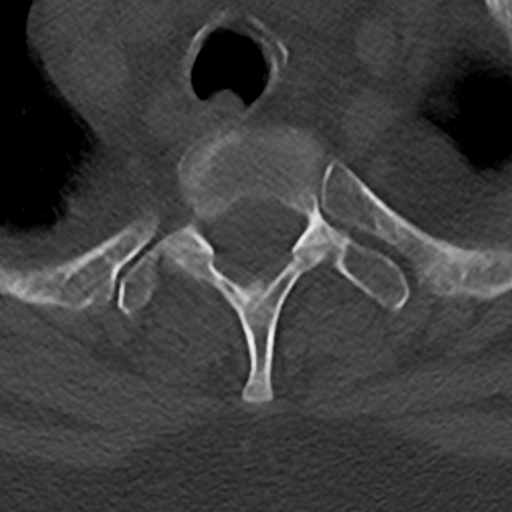
[im 55/109  bone]
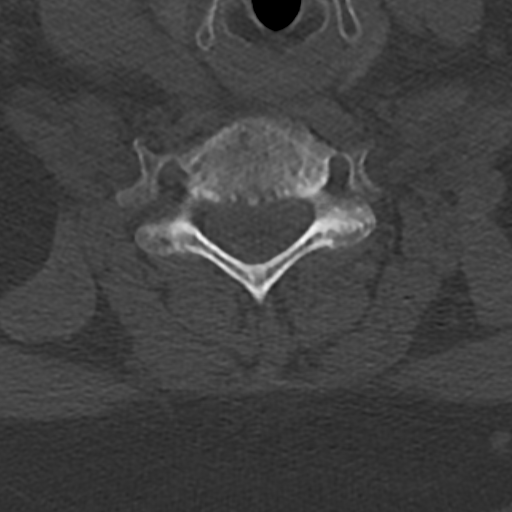
[im 73/109  bone]
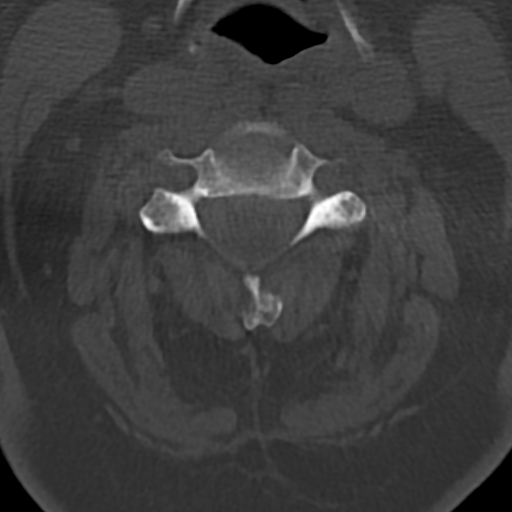
[im 91/109  soft-tissue]
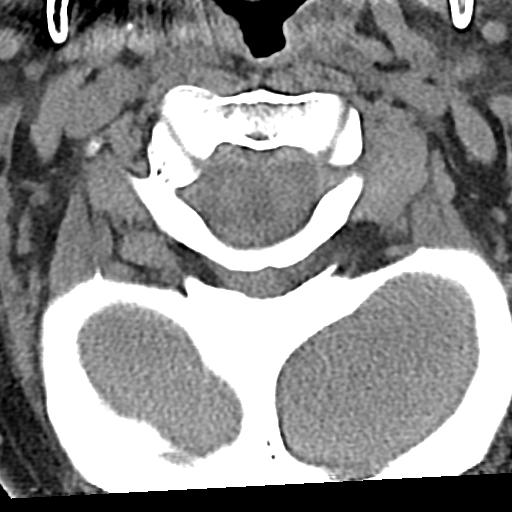
[im 91/109  bone]
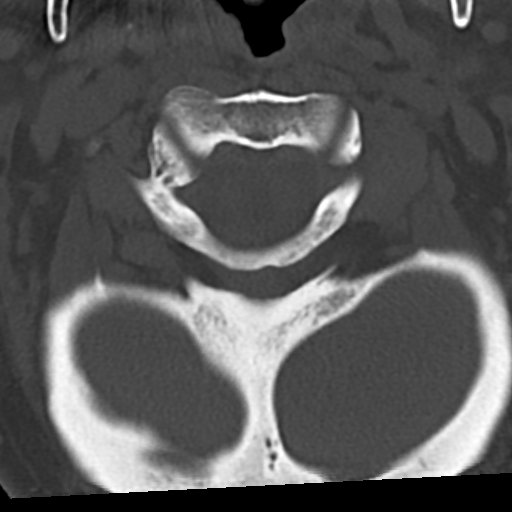

[13 of 33 positions shown; findings below may reference images not displayed]

FINDINGS: CT HEAD FINDINGS

Brain: No evidence of acute infarction, hemorrhage, hydrocephalus,
extra-axial collection or mass lesion/mass effect.

Vascular: No hyperdense vessel or unexpected calcification.

Skull: Normal. Negative for fracture or focal lesion.

Other: Right frontal scalp laceration is noted.

CT MAXILLOFACIAL FINDINGS

Osseous: No fracture or mandibular dislocation. No destructive
process.

Orbits: Negative. No traumatic or inflammatory finding.

Sinuses: Clear.

Soft tissues: Large subcutaneous hematoma is seen overlying right
maxillary region.

CT CERVICAL SPINE FINDINGS

Alignment: Normal.

Skull base and vertebrae: No acute fracture. No primary bone lesion
or focal pathologic process.

Soft tissues and spinal canal: No prevertebral fluid or swelling. No
visible canal hematoma.

Disc levels: Severe degenerative disc disease is noted at C5-6 and
C6-7.

Upper chest: Negative.

Other: None.
IMPRESSION: Right frontal scalp laceration is noted. No acute intracranial
abnormality seen.

Large subcutaneous hematoma overlying right maxillary region. No
other abnormality seen in maxillofacial region.

Severe multilevel degenerative disc disease. No acute abnormality
seen in the cervical spine.

## 2020-06-23 ENCOUNTER — Encounter (HOSPITAL_COMMUNITY): Payer: Self-pay | Admitting: Emergency Medicine

## 2020-06-23 ENCOUNTER — Emergency Department (HOSPITAL_COMMUNITY)
Admission: EM | Admit: 2020-06-23 | Discharge: 2020-06-23 | Disposition: A | Discharge status: Home / Self Care | Payer: Medicare Other | Attending: Emergency Medicine | Admitting: Emergency Medicine

## 2020-06-23 ENCOUNTER — Emergency Department (HOSPITAL_COMMUNITY): Payer: Medicare Other

## 2020-06-23 DIAGNOSIS — Z28310 Unvaccinated for covid-19: Secondary | ICD-10-CM | POA: Diagnosis not present

## 2020-06-23 DIAGNOSIS — I509 Heart failure, unspecified: Secondary | ICD-10-CM | POA: Diagnosis not present

## 2020-06-23 DIAGNOSIS — I110 Hypertensive heart disease with heart failure: Secondary | ICD-10-CM | POA: Insufficient documentation

## 2020-06-23 DIAGNOSIS — Z7982 Long term (current) use of aspirin: Secondary | ICD-10-CM | POA: Insufficient documentation

## 2020-06-23 DIAGNOSIS — R059 Cough, unspecified: Secondary | ICD-10-CM | POA: Diagnosis not present

## 2020-06-23 DIAGNOSIS — Z20822 Contact with and (suspected) exposure to covid-19: Secondary | ICD-10-CM | POA: Diagnosis not present

## 2020-06-23 DIAGNOSIS — I878 Other specified disorders of veins: Secondary | ICD-10-CM

## 2020-06-23 DIAGNOSIS — M7989 Other specified soft tissue disorders: Secondary | ICD-10-CM | POA: Diagnosis present

## 2020-06-23 DIAGNOSIS — Z79899 Other long term (current) drug therapy: Secondary | ICD-10-CM | POA: Diagnosis not present

## 2020-06-23 DIAGNOSIS — R339 Retention of urine, unspecified: Secondary | ICD-10-CM | POA: Diagnosis not present

## 2020-06-23 DIAGNOSIS — I872 Venous insufficiency (chronic) (peripheral): Secondary | ICD-10-CM | POA: Insufficient documentation

## 2020-06-23 LAB — BRAIN NATRIURETIC PEPTIDE: B Natriuretic Peptide: 43.3 pg/mL (ref 0.0–100.0)

## 2020-06-23 LAB — RESP PANEL BY RT-PCR (FLU A&B, COVID) ARPGX2
Influenza A by PCR: NEGATIVE
Influenza B by PCR: NEGATIVE
SARS Coronavirus 2 by RT PCR: NEGATIVE

## 2020-06-23 LAB — COMPREHENSIVE METABOLIC PANEL
ALT: 23 U/L (ref 0–44)
AST: 24 U/L (ref 15–41)
Albumin: 3.6 g/dL (ref 3.5–5.0)
Alkaline Phosphatase: 77 U/L (ref 38–126)
Anion gap: 9 (ref 5–15)
BUN: 15 mg/dL (ref 8–23)
CO2: 23 mmol/L (ref 22–32)
Calcium: 8.7 mg/dL — ABNORMAL LOW (ref 8.9–10.3)
Chloride: 105 mmol/L (ref 98–111)
Creatinine, Ser: 0.61 mg/dL (ref 0.44–1.00)
GFR, Estimated: >60 mL/min (ref >60)
Glucose, Bld: 102 mg/dL — ABNORMAL HIGH (ref 70–99)
Potassium: 3.6 mmol/L (ref 3.5–5.1)
Sodium: 137 mmol/L (ref 135–145)
Total Bilirubin: 1.7 mg/dL — ABNORMAL HIGH (ref 0.3–1.2)
Total Protein: 6.8 g/dL (ref 6.5–8.1)

## 2020-06-23 LAB — CBC
HCT: 40.9 % (ref 36.0–46.0)
Hemoglobin: 13.1 g/dL (ref 12.0–15.0)
MCH: 27.5 pg (ref 26.0–34.0)
MCHC: 32 g/dL (ref 30.0–36.0)
MCV: 85.9 fL (ref 80.0–100.0)
Platelets: 197 10*3/uL (ref 150–400)
RBC: 4.76 MIL/uL (ref 3.87–5.11)
RDW: 14.1 % (ref 11.5–15.5)
WBC: 4.2 10*3/uL (ref 4.0–10.5)
nRBC: 0 % (ref 0.0–0.2)

## 2020-06-23 LAB — LACTIC ACID, PLASMA: Lactic Acid, Venous: 1.1 mmol/L (ref 0.5–1.9)

## 2020-06-23 MED ORDER — CETIRIZINE HCL 10 MG PO TABS: 10.0000 mg | ORAL_TABLET | Freq: Every day | ORAL | 0 refills | Status: DC

## 2020-06-23 MED ORDER — FLUTICASONE PROPIONATE 50 MCG/ACT NA SUSP: 2.0000 | Freq: Every day | NASAL | 0 refills | Status: DC

## 2020-06-23 NOTE — ED Provider Notes (Signed)
[facility] EMERGENCY DEPARTMENT Provider Note   CSN: [id number] Arrival date & time: 06/23/20  0757     History Chief Complaint  Patient presents with  . Cough  . Cellulitis    Ann Graham is a 65 y.o. female with prior past medical history of Budd-Chiari syndrome, hypertension the presents to the emerge department today via EMS for productive cough, lower extremity leg swelling, decreased urine output.  Patient states that she is primarily here for cough and shortness of breath that is been present for the past month.  Cough is nonproductive.  Patient states that she was treated 2 weeks ago with amoxicillin for this, cough did not clear.  Denies any sore throat, congestion, fevers.  Does state that she feels as if there is a tickle in the back of her throat, feels slightly dry, does admit to some postnasal drip.  Patient also admits to lower extremity swelling for the past multiple months, has not changed.  Patient also admits to some orthopnea over the past couple of months.  No chest pain.  Patient is on Diamox for her lower limb edema and chronic venous stasis.  Denies any fevers, color change, new warmth to her lower extremities.  Patient also states that she has had urinary retension for the past 2 months, states that she was diagnosed with a UTI 2 days ago and started taking her antibiotics then.  Per chart review patient was seen at [facility] for urinary retention by urology 2 days ago.  Plan 2 days ago was to proceed with renal ultrasound and cystoscopy in the office at the next visit in 3 weeks.  States that she was able to urinate right before EMS picked her up.  Has not been vaccinated against COVID.  Denies any sick contacts.  Denies any dysuria or hematuria.  Denies any nausea, vomiting, abdominal pain, back pain, chest pain.  Denies any headache or myalgias.  Admits to history of CHF, however unable to see this in her chart.  No history of  COPD.Denies any tobacco use.  No hemoptysis.     HPI     Past Medical History:  Diagnosis Date  . Anxiety   . 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 Elevated cholesterol   . Hx of cardiovascular stress test    Lexiscan Myoview 4/14:  No ischemia, EF 75%.  Marland Kitchen Hx of echocardiogram    Echo 3/14: EF 55-60%, Gr 1 DD, mild LAE  . Hypertension     Patient Active Problem List   Diagnosis Date Noted  . Diastolic dysfunction 40/98/1191  . Chest pain at rest 06/13/2012  . Hypokalemia 06/13/2012  . Cardiomegaly 06/13/2012  . HYPERLIPIDEMIA 08/12/2008  . OBESITY 08/12/2008  . HYPERTENSION 08/12/2008    Past Surgical History:  Procedure Laterality Date  . DILATION AND CURETTAGE OF UTERUS       OB History   No obstetric history on file.     Family History  Problem Relation Age of Onset  . Heart disease Mother   . Heart disease Father   . Colon cancer Father   . Heart disease Brother     Social History   Tobacco Use  . Smoking status: Never Smoker  . Smokeless tobacco: Never Used  Substance Use Topics  . Alcohol use: Yes    Comment: wine occasional  . Drug use: No    Home Medications Prior to Admission medications   Medication Sig Start Date  End Date Taking? Authorizing Provider  cetirizine (ZYRTEC ALLERGY) 10 MG tablet Take 1 tablet (10 mg total) by mouth daily. 06/23/20 07/23/20 Yes Japheth Diekman, PA-C  fluticasone (FLONASE) 50 MCG/ACT nasal spray Place 2 sprays into both nostrils daily. 06/23/20 07/23/20 Yes Alfredia Client, PA-C  aspirin EC 81 MG EC tablet Take 1 tablet (81 mg total) by mouth daily. 06/14/12   Oswald Hillock, MD  atorvastatin (LIPITOR) 20 MG tablet Take 20 mg by mouth daily.    [provider]  furosemide (LASIX) 20 MG tablet Take 20-40 mg by mouth daily. 06/14/12   Oswald Hillock, MD  traMADol (ULTRAM) 50 MG tablet Take 1 tablet (50 mg total) by mouth every 6 (six) hours as needed. 08/17/18   Drenda Freeze, MD  traZODone (DESYREL) 50 MG tablet Take 50  mg by mouth at bedtime.    [provider]    Allergies    Hydrocodone and Lisinopril  Review of Systems   Review of Systems  Constitutional: Negative for chills, diaphoresis, fatigue and fever.  HENT: Negative for congestion, sore throat and trouble swallowing.   Eyes: Negative for pain and visual disturbance.  Respiratory: Positive for cough and shortness of breath. Negative for wheezing.   Cardiovascular: Positive for leg swelling. Negative for chest pain and palpitations.  Gastrointestinal: Negative for abdominal distention, abdominal pain, diarrhea, nausea and vomiting.  Genitourinary: Positive for decreased urine volume and difficulty urinating. Negative for dyspareunia, dysuria, enuresis, flank pain, frequency, pelvic pain and urgency.  Musculoskeletal: Negative for back pain, neck pain and neck stiffness.  Skin: Negative for pallor.  Neurological: Negative for dizziness, speech difficulty, weakness and headaches.  Psychiatric/Behavioral: Negative for confusion.    Physical Exam Updated Vital Signs BP (!) 165/79 (BP Location: Right Arm)   Pulse 65   Temp 98.5 F (36.9 C) (Oral)   Resp (!) 21   SpO2 100%   Physical Exam Constitutional:      General: She is not in acute distress.    Appearance: Normal appearance. She is not ill-appearing, toxic-appearing or diaphoretic.  HENT:     Head: Normocephalic and atraumatic.     Mouth/Throat:     Mouth: Mucous membranes are moist.     Pharynx: Oropharynx is clear.  Eyes:     General: No scleral icterus.    Extraocular Movements: Extraocular movements intact.     Pupils: Pupils are equal, round, and reactive to light.  Cardiovascular:     Rate and Rhythm: Normal rate and regular rhythm.     Pulses: Normal pulses.     Heart sounds: Normal heart sounds.  Pulmonary:     Effort: Pulmonary effort is normal. No respiratory distress.     Breath sounds: No stridor. Rales present. No wheezing or rhonchi.  Chest:      Chest wall: No tenderness.  Abdominal:     General: Abdomen is flat. There is no distension.     Palpations: Abdomen is soft.     Tenderness: There is no abdominal tenderness. There is no guarding or rebound.  Musculoskeletal:        General: No swelling or tenderness. Normal range of motion.     Cervical back: Normal range of motion and neck supple. No rigidity.     Right lower leg: Edema present.     Left lower leg: Edema present.     Comments: Bilateral lower extremities with some mild erythema and mild warmth, 2+ pitting edema to mid shin.  Patient states this is how it chronically appears.  Normal range of motion and strength with PT pulses 2+  Skin:    General: Skin is warm and dry.     Capillary Refill: Capillary refill takes less than 2 seconds.     Coloration: Skin is not pale.  Neurological:     General: No focal deficit present.     Mental Status: She is alert and oriented to person, place, and time.  Psychiatric:        Mood and Affect: Mood normal.        Behavior: Behavior normal.     ED Results / Procedures / Treatments   Labs (all labs ordered are listed, but only abnormal results are displayed) Labs Reviewed  COMPREHENSIVE METABOLIC PANEL - Abnormal; Notable for the following components:      Result Value   Glucose, Bld 102 (*)    Calcium 8.7 (*)    Total Bilirubin 1.7 (*)    All other components within normal limits  RESP PANEL BY RT-PCR (FLU A&B, COVID) ARPGX2  URINE CULTURE  CBC  LACTIC ACID, PLASMA  BRAIN NATRIURETIC PEPTIDE  URINALYSIS, COMPLETE (UACMP) WITH MICROSCOPIC    EKG None  Radiology DG Chest Port 1 View  Result Date: 06/23/2020 CLINICAL DATA:  Shortness of breath and cough EXAM: PORTABLE CHEST 1 VIEW COMPARISON:  August 17, 2018 FINDINGS: A portion of the lateral left base is not included on this examination. Visualized lungs are clear. There is cardiomegaly with pulmonary vascularity normal. No adenopathy. No bone lesions. IMPRESSION: Note  that a portion of the lateral left base is not included on this examination. Visualized lungs clear. Stable cardiomegaly. No adenopathy appreciable. Electronically Signed   By: Lowella Grip III M.D.   On: 06/23/2020 08:54    Procedures Procedures   Medications Ordered in ED Medications - No data to display  ED Course  I have reviewed the triage vital signs and the nursing notes.  Pertinent labs & imaging results that were available during my care of the patient were reviewed by me and considered in my medical decision making (see chart for details).    MDM Rules/Calculators/A&P                          Braylon Lemmons is a 65 y.o. female with prior past medical history of Budd-Chiari syndrome, hypertension the presents to the emerge department today via EMS for productive cough, lower extremity leg swelling, decreased urine output.  Patient appears stable, no respiratory distress.  dIfferential to include CHF, COPD, allergies, viral syndrome.  In regards to lower extremity swelling, persistent this could be cellulitis however patient states that this is chronic for her, no fever.  No changes.  Will obtain labs to evaluate.  Patient is currently on Diamox for this.  Is being monitored by PCP.  Patient is currently on antibiotic for UTI, has follow-up with urology, was able to urinate normally this morning.  Work-up today unremarkable, CBC without any leukocytosis, lactic acid normal, BNP 43.  CMP without any electrolyte derangements.  Chest x-ray interpreted me without any acute cardiopulmonary disease.  Unable to completely see left lateral lung base, otherwise unremarkable.  Covid negative.  EKG normal sinus rhythm.  Badder scan shows 75 mL of urine, patient went to the bathroom with nurse to urinate, missed the entire hat, unable to get urine on multiple attempts.  Patient is already on antibiotic for  UTI.  Did discuss she will need to follow-up with her PCP in regards to this.  Patient  states that she is comfortable leaving without urine sample.  Already has urology appointment.  I think cough is most likely coming from allergies, started a month ago.  Patient has failed amoxicillin.  Symptomatic treatment discussed in regards to this.  We will have patient follow-up with pulmonology.  Patient agreeable with plan, was able to ambulate patient, normal gait, remained above 98% on room air on ambulation.  In regards to lower leg swelling, did discuss this is most likely chronic venous stasis.  Does not appear cellulitic, patient agreeable with plan, asking to be discharged.  Doubt need for further emergent work up at this time. I explained the diagnosis and have given explicit precautions to return to the ER including for any other new or worsening symptoms. The patient understands and accepts the medical plan as it's been dictated and I have answered their questions. Discharge instructions concerning home care and prescriptions have been given. The patient is STABLE and is discharged to home in good condition.  Final Clinical Impression(s) / ED Diagnoses Final diagnoses:  Chronic venous stasis  Cough  Urinary retention    Rx / DC Orders ED Discharge Orders         Ordered    Ambulatory referral to Pulmonology        06/23/20 1131    cetirizine (ZYRTEC ALLERGY) 10 MG tablet  Daily        06/23/20 1139    fluticasone (FLONASE) 50 MCG/ACT nasal spray  Daily        06/23/20 [place], Ramell Wacha, PA-C 06/23/20 1150    Breck Coons, MD 06/24/20 [phone]

## 2020-06-23 NOTE — ED Notes (Signed)
Bladder scan showed 63mL of urine in the bladder

## 2020-06-23 NOTE — ED Notes (Signed)
Patient transported to [facility] with a hat to catch the urine, pt voided in the toilet but missed the hat, had no urine in the toilet. Pt voided in toilet.

## 2020-06-23 NOTE — ED Triage Notes (Signed)
IV left hand.  COVID test negative per  EMS

## 2020-06-23 NOTE — Discharge Instructions (Signed)
Please follow-up with your primary care for today's visit.  Your work-up today was reassuring, you should get a call from the pulmonologist in the next week or so to get schedule appointment, this is important to evaluate your cough further.  I think that this is most likely from allergies, start taking Zyrtec or Allegra as we spoke about.  You can also start using Flonase.  If you have any new worsening concerning symptom please come back to the emerge department.  Please keep your urology appointment with your urologist in the next couple of weeks.  Get help right away if: You cough up blood. You have trouble breathing. Your heartbeat is very fast.

## 2020-06-23 NOTE — ED Triage Notes (Signed)
EMS stated, productive Cough for a week with some cellulitis with edema in both lower legs.  She has decrease UA output. Went to Dr. On Wednesday for a UA test and could not complete because of not enough urine.

## 2020-07-16 ENCOUNTER — Ambulatory Visit (INDEPENDENT_AMBULATORY_CARE_PROVIDER_SITE_OTHER): Payer: Medicare Other | Admitting: Pulmonary Disease

## 2020-07-16 ENCOUNTER — Other Ambulatory Visit: Payer: Self-pay

## 2020-07-16 ENCOUNTER — Encounter: Payer: Self-pay | Admitting: Pulmonary Disease

## 2020-07-16 VITALS — BP 155/90 | HR 65 | Temp 98.2°F | Ht 63.0 in | Wt 254.6 lb

## 2020-07-16 DIAGNOSIS — R0600 Dyspnea, unspecified: Secondary | ICD-10-CM

## 2020-07-16 DIAGNOSIS — J4540 Moderate persistent asthma, uncomplicated: Secondary | ICD-10-CM | POA: Diagnosis not present

## 2020-07-16 DIAGNOSIS — R0609 Other forms of dyspnea: Secondary | ICD-10-CM

## 2020-07-16 MED ORDER — BUDESONIDE-FORMOTEROL FUMARATE 160-4.5 MCG/ACT IN AERO: 2.0000 | INHALATION_SPRAY | Freq: Two times a day (BID) | RESPIRATORY_TRACT | 12 refills | Status: DC

## 2020-07-16 MED ORDER — ALBUTEROL SULFATE HFA 108 (90 BASE) MCG/ACT IN AERS: 2.0000 | INHALATION_SPRAY | Freq: Four times a day (QID) | RESPIRATORY_TRACT | 11 refills | Status: AC | PRN

## 2020-07-16 NOTE — Patient Instructions (Signed)
Nice to meet you  Use Symbicort 2 puffs twice a day every day.  This is to help with your breathing and shortness of breath which I think is somewhat related to uncontrolled asthma.  Please rinse your mouth out after every use.  In addition, use albuterol 2 puffs every 6 hours as needed for wheezing or shortness of breath.  Some of the shortness of breath is related to deconditioning, muscles being weaker with less use as well as some weight gain.  Congratulations on your weight loss, I am hopeful that with treating your breathing you will have more stamina to continue on your journey.  We will get PFTs or breathing test at the time of your next appointment.  Return to clinic in 3 months for follow-up with Dr. Silas Flood.

## 2020-07-16 NOTE — Progress Notes (Signed)
@Patient  ID: Ann Graham, female    DOB: [birth year], 65 y.o.   MRN: [id number]  Chief Complaint  Patient presents with  . Consult    Referring provider: Alfredia Client, PA-C  HPI:   65 year old whom we are seeing in consultation for dyspnea on exertion.  PCP note reviewed.  Patient reports worsening dyspnea on exertion the last couple months.  This is in setting of worsening pollen.  She denies significant rhinorrhea, watery eyes.  She does endorse a history of chronic seasonal allergies that seems well treated with antihistamine and Flonase spray.  No time during day were things are better or worse.  No environmental changes to account for this.  Again notably worse in the last 2 months with worsening pollen.  She does endorse a history of recurrent bronchitis as an adult.  As well as recurrent croup or bronchitis as a child.  She is used inhalers in the past has been a longtime since last used and cannot recall which inhalers.  Recent treatment includes multiple rounds of antibiotics with minimal improvement.  Did get a course of prednisone a few weeks ago which did temporarily improve her dyspnea.  No other relieving or exacerbating factors.  No positional changes were things are better or worse.  She was seen in the ED 06/2020 and had a chest x-ray which was personally reviewed interpreted as clear lungs, no pneumothorax, effusion, pleural effusion.  TTE 2014 reviewed with normal EF, grade 1 diastolic function, normal LA size.  PMH: Seasonal allergies, chronic venous stasis Surgical history: Cesarean section Family history: Mother with CAD, father with CAD, brother with CAD, brother recently diagnosed with small cell lung cancer Social history: Never smoker, lives in [place], has 2 children  Questionaires / Pulmonary Flowsheets:   ACT:  No flowsheet data found.  MMRC: No flowsheet data found.  Epworth:  No flowsheet data found.  Tests:   FENO:  No results found for:  NITRICOXIDE  PFT: No flowsheet data found.  WALK:  No flowsheet data found.  Imaging: Personally reviewed and as per EMR discussion in this note DG Chest Port 1 View  Result Date: 06/23/2020 CLINICAL DATA:  Shortness of breath and cough EXAM: PORTABLE CHEST 1 VIEW COMPARISON:  August 17, 2018 FINDINGS: A portion of the lateral left base is not included on this examination. Visualized lungs are clear. There is cardiomegaly with pulmonary vascularity normal. No adenopathy. No bone lesions. IMPRESSION: Note that a portion of the lateral left base is not included on this examination. Visualized lungs clear. Stable cardiomegaly. No adenopathy appreciable. Electronically Signed   By: Lowella Grip III M.D.   On: 06/23/2020 08:54    Lab Results: Personally reviewed, no anemia, remote labs in 2020 with no elevated eosinophils CBC    Component Value Date/Time   WBC 4.2 06/23/2020 0951   RBC 4.76 06/23/2020 0951   HGB 13.1 06/23/2020 0951   HCT 40.9 06/23/2020 0951   PLT 197 06/23/2020 0951   MCV 85.9 06/23/2020 0951   MCH 27.5 06/23/2020 0951   MCHC 32.0 06/23/2020 0951   RDW 14.1 06/23/2020 0951   LYMPHSABS 1.5 08/21/2018 1724   MONOABS 0.5 08/21/2018 1724   EOSABS 0.1 08/21/2018 1724   BASOSABS 0.1 08/21/2018 1724    BMET    Component Value Date/Time   NA 137 06/23/2020 0951   K 3.6 06/23/2020 0951   CL 105 06/23/2020 0951   CO2 23 06/23/2020 0951   GLUCOSE 102 (H)  06/23/2020 0951   BUN 15 06/23/2020 0951   CREATININE 0.61 06/23/2020 0951   CALCIUM 8.7 (L) 06/23/2020 0951   GFRNONAA >60 06/23/2020 0951   GFRAA >60 08/21/2018 1724    BNP    Component Value Date/Time   BNP 43.3 06/23/2020 0952    ProBNP    Component Value Date/Time   PROBNP 68.6 09/01/2012 1422    Specialty Problems   None     Allergies  Allergen Reactions  . Hydrocodone Hives  . Lisinopril     Congestion, fluid build up    Immunization History  Administered Date(s) Administered  .  Influenza Split 01/30/2015  . Tdap 08/17/2018    Past Medical History:  Diagnosis Date  . Anxiety   . 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 Elevated cholesterol   . Hx of cardiovascular stress test    Lexiscan Myoview 4/14:  No ischemia, EF 75%.  Marland Kitchen Hx of echocardiogram    Echo 3/14: EF 55-60%, Gr 1 DD, mild LAE  . Hypertension     Tobacco History: Social History   Tobacco Use  Smoking Status Never Smoker  Smokeless Tobacco Never Used   Counseling given: Not Answered   Continue to not smoke  Outpatient Encounter Medications as of 07/16/2020  Medication Sig  . albuterol (VENTOLIN HFA) 108 (90 Base) MCG/ACT inhaler Inhale 2 puffs into the lungs every 6 (six) hours as needed for wheezing or shortness of breath.  . budesonide-formoterol (SYMBICORT) 160-4.5 MCG/ACT inhaler Inhale 2 puffs into the lungs in the morning and at bedtime.  Marland Kitchen aspirin EC 81 MG EC tablet Take 1 tablet (81 mg total) by mouth daily.  Marland Kitchen atorvastatin (LIPITOR) 20 MG tablet Take 20 mg by mouth daily.  . cetirizine (ZYRTEC ALLERGY) 10 MG tablet Take 1 tablet (10 mg total) by mouth daily.  . fluticasone (FLONASE) 50 MCG/ACT nasal spray Place 2 sprays into both nostrils daily.  . furosemide (LASIX) 20 MG tablet Take 20-40 mg by mouth daily.  . traMADol (ULTRAM) 50 MG tablet Take 1 tablet (50 mg total) by mouth every 6 (six) hours as needed.  . traZODone (DESYREL) 50 MG tablet Take 50 mg by mouth at bedtime.   No facility-administered encounter medications on file as of 07/16/2020.     Review of Systems  Review of Systems  No chest pain with exertion.  Chronic stable lower extremity swelling with ruddy erythema that is unchanged.  No orthopnea or PND.  Comprehensive review systems otherwise negative. Physical Exam  BP (!) 155/90 (BP Location: Left Arm, Cuff Size: Normal)   Pulse 65   Temp 98.2 F (36.8 C) (Oral)   Ht 5\' 3"  (1.6 m)   Wt 254 lb 9.6 oz (115.5 kg)   SpO2 94%   BMI 45.10 kg/m   Wt Readings from  Last 5 Encounters:  07/16/20 254 lb 9.6 oz (115.5 kg)  04/19/13 220 lb (99.8 kg)  04/06/13 220 lb (99.8 kg)  06/23/12 218 lb (98.9 kg)  06/16/12 220 lb (99.8 kg)    BMI Readings from Last 5 Encounters:  07/16/20 45.10 kg/m  04/19/13 37.76 kg/m  04/06/13 37.76 kg/m  06/23/12 37.42 kg/m  06/16/12 37.76 kg/m     Physical Exam General: Well-appearing, no acute distress Eyes: EOMI, no icterus Neck: Supple, no JVP Cardiovascular: Regular rate and rhythm, no murmur Pulmonary: Clear to auscultation bilaterally, no wheeze, no work of breathing Abdomen: Nondistended, bowel sounds present MSK: No synovitis, no joint effusion Neuro: Normal gait,  no weakness Psych: Normal mood, full affect   Assessment & Plan:   DOE: Likely multifactorial and related to obesity, deconditioning, concern for uncontrolled asthma. PFTs ordered for further evaluation.  Asthma: Based on atopic symptoms, report of wheeze, worse with recent pollen season, recurrent childhood bronchitis and recurrent bronchitis as above.  Start ICS/LABA therapy with high-dose Symbicort.  Albuterol as needed provided.  PFTs as above.   Return in about 3 months (around 10/16/2020).   Lanier Clam, MD 07/16/2020

## 2020-09-11 ENCOUNTER — Other Ambulatory Visit: Payer: Self-pay | Admitting: Family Medicine

## 2020-09-11 DIAGNOSIS — Z1231 Encounter for screening mammogram for malignant neoplasm of breast: Secondary | ICD-10-CM

## 2020-11-02 ENCOUNTER — Other Ambulatory Visit: Payer: Self-pay

## 2020-11-02 ENCOUNTER — Ambulatory Visit
Admission: RE | Admit: 2020-11-02 | Discharge: 2020-11-02 | Disposition: A | Discharge status: Home / Self Care | Payer: Medicare Other | Source: Ambulatory Visit | Attending: Family Medicine | Admitting: Family Medicine

## 2020-11-02 DIAGNOSIS — Z1231 Encounter for screening mammogram for malignant neoplasm of breast: Secondary | ICD-10-CM

## 2021-03-28 DIAGNOSIS — M79641 Pain in right hand: Secondary | ICD-10-CM | POA: Insufficient documentation

## 2021-04-16 ENCOUNTER — Emergency Department (HOSPITAL_COMMUNITY): Payer: Medicare Other

## 2021-04-16 ENCOUNTER — Other Ambulatory Visit: Payer: Self-pay

## 2021-04-16 ENCOUNTER — Encounter (HOSPITAL_COMMUNITY): Payer: Self-pay | Admitting: Emergency Medicine

## 2021-04-16 ENCOUNTER — Inpatient Hospital Stay (HOSPITAL_COMMUNITY)
Admission: EM | Admit: 2021-04-16 | Discharge: 2021-04-18 | DRG: 543 | Disposition: A | Discharge status: Skilled Nursing Facility | Payer: Medicare Other | Attending: Internal Medicine | Admitting: Internal Medicine

## 2021-04-16 DIAGNOSIS — M4856XA Collapsed vertebra, not elsewhere classified, lumbar region, initial encounter for fracture: Secondary | ICD-10-CM | POA: Diagnosis not present

## 2021-04-16 DIAGNOSIS — S32010A Wedge compression fracture of first lumbar vertebra, initial encounter for closed fracture: Secondary | ICD-10-CM | POA: Diagnosis not present

## 2021-04-16 DIAGNOSIS — Z886 Allergy status to analgesic agent status: Secondary | ICD-10-CM

## 2021-04-16 DIAGNOSIS — W010XXA Fall on same level from slipping, tripping and stumbling without subsequent striking against object, initial encounter: Secondary | ICD-10-CM | POA: Diagnosis present

## 2021-04-16 DIAGNOSIS — E7800 Pure hypercholesterolemia, unspecified: Secondary | ICD-10-CM | POA: Diagnosis present

## 2021-04-16 DIAGNOSIS — E876 Hypokalemia: Secondary | ICD-10-CM | POA: Diagnosis present

## 2021-04-16 DIAGNOSIS — R296 Repeated falls: Secondary | ICD-10-CM | POA: Diagnosis not present

## 2021-04-16 DIAGNOSIS — Z7982 Long term (current) use of aspirin: Secondary | ICD-10-CM

## 2021-04-16 DIAGNOSIS — Z888 Allergy status to other drugs, medicaments and biological substances status: Secondary | ICD-10-CM

## 2021-04-16 DIAGNOSIS — Z20822 Contact with and (suspected) exposure to covid-19: Secondary | ICD-10-CM | POA: Diagnosis present

## 2021-04-16 DIAGNOSIS — Z8249 Family history of ischemic heart disease and other diseases of the circulatory system: Secondary | ICD-10-CM

## 2021-04-16 DIAGNOSIS — G4733 Obstructive sleep apnea (adult) (pediatric): Secondary | ICD-10-CM

## 2021-04-16 DIAGNOSIS — I498 Other specified cardiac arrhythmias: Secondary | ICD-10-CM | POA: Diagnosis present

## 2021-04-16 DIAGNOSIS — I5032 Chronic diastolic (congestive) heart failure: Secondary | ICD-10-CM

## 2021-04-16 DIAGNOSIS — N390 Urinary tract infection, site not specified: Secondary | ICD-10-CM

## 2021-04-16 DIAGNOSIS — Z885 Allergy status to narcotic agent status: Secondary | ICD-10-CM

## 2021-04-16 DIAGNOSIS — Z6841 Body Mass Index (BMI) 40.0 and over, adult: Secondary | ICD-10-CM

## 2021-04-16 DIAGNOSIS — Z7951 Long term (current) use of inhaled steroids: Secondary | ICD-10-CM

## 2021-04-16 DIAGNOSIS — Z79899 Other long term (current) drug therapy: Secondary | ICD-10-CM

## 2021-04-16 DIAGNOSIS — W19XXXA Unspecified fall, initial encounter: Secondary | ICD-10-CM

## 2021-04-16 DIAGNOSIS — R008 Other abnormalities of heart beat: Secondary | ICD-10-CM | POA: Diagnosis present

## 2021-04-16 DIAGNOSIS — I110 Hypertensive heart disease with heart failure: Secondary | ICD-10-CM | POA: Diagnosis present

## 2021-04-16 LAB — URINALYSIS, ROUTINE W REFLEX MICROSCOPIC
Bilirubin Urine: NEGATIVE
Glucose, UA: NEGATIVE mg/dL
Hgb urine dipstick: NEGATIVE
Ketones, ur: 15 mg/dL — AB
Nitrite: POSITIVE — AB
Protein, ur: 100 mg/dL — AB
Specific Gravity, Urine: <1.005 — ABNORMAL LOW (ref 1.005–1.030)
pH: 8.5 — ABNORMAL HIGH (ref 5.0–8.0)

## 2021-04-16 LAB — RESP PANEL BY RT-PCR (FLU A&B, COVID) ARPGX2
Influenza A by PCR: NEGATIVE
Influenza B by PCR: NEGATIVE
SARS Coronavirus 2 by RT PCR: NEGATIVE

## 2021-04-16 LAB — COMPREHENSIVE METABOLIC PANEL
ALT: 19 U/L (ref 0–44)
AST: 26 U/L (ref 15–41)
Albumin: 3.5 g/dL (ref 3.5–5.0)
Alkaline Phosphatase: 79 U/L (ref 38–126)
Anion gap: 11 (ref 5–15)
BUN: 18 mg/dL (ref 8–23)
CO2: 26 mmol/L (ref 22–32)
Calcium: 8.8 mg/dL — ABNORMAL LOW (ref 8.9–10.3)
Chloride: 101 mmol/L (ref 98–111)
Creatinine, Ser: 0.74 mg/dL (ref 0.44–1.00)
GFR, Estimated: >60 mL/min (ref >60)
Glucose, Bld: 114 mg/dL — ABNORMAL HIGH (ref 70–99)
Potassium: 3.2 mmol/L — ABNORMAL LOW (ref 3.5–5.1)
Sodium: 138 mmol/L (ref 135–145)
Total Bilirubin: 1.1 mg/dL (ref 0.3–1.2)
Total Protein: 7 g/dL (ref 6.5–8.1)

## 2021-04-16 LAB — CBC WITH DIFFERENTIAL/PLATELET
Abs Immature Granulocytes: 0.04 10*3/uL (ref 0.00–0.07)
Basophils Absolute: 0 10*3/uL (ref 0.0–0.1)
Basophils Relative: 0 %
Eosinophils Absolute: 0 10*3/uL (ref 0.0–0.5)
Eosinophils Relative: 0 %
HCT: 38 % (ref 36.0–46.0)
Hemoglobin: 12.5 g/dL (ref 12.0–15.0)
Immature Granulocytes: 0 %
Lymphocytes Relative: 14 %
Lymphs Abs: 1.3 10*3/uL (ref 0.7–4.0)
MCH: 27.4 pg (ref 26.0–34.0)
MCHC: 32.9 g/dL (ref 30.0–36.0)
MCV: 83.2 fL (ref 80.0–100.0)
Monocytes Absolute: 0.6 10*3/uL (ref 0.1–1.0)
Monocytes Relative: 6 %
Neutro Abs: 7.2 10*3/uL (ref 1.7–7.7)
Neutrophils Relative %: 80 %
Platelets: 261 10*3/uL (ref 150–400)
RBC: 4.57 MIL/uL (ref 3.87–5.11)
RDW: 14 % (ref 11.5–15.5)
WBC: 9.1 10*3/uL (ref 4.0–10.5)
nRBC: 0 % (ref 0.0–0.2)

## 2021-04-16 LAB — URINALYSIS, MICROSCOPIC (REFLEX): WBC, UA: >50 WBC/hpf (ref 0–5)

## 2021-04-16 MED ORDER — CALCIUM GLUCONATE-NACL 1-0.675 GM/50ML-% IV SOLN
1.0000 g | Freq: Once | INTRAVENOUS | Status: AC
  Administered 2021-04-16: 1000 mg via INTRAVENOUS
  Filled 2021-04-16: qty 50

## 2021-04-16 MED ORDER — ACETAMINOPHEN 325 MG PO TABS
650.0000 mg | ORAL_TABLET | Freq: Four times a day (QID) | ORAL | Status: DC | PRN
  Administered 2021-04-17: 650 mg via ORAL
  Filled 2021-04-16: qty 2

## 2021-04-16 MED ORDER — POTASSIUM CHLORIDE CRYS ER 20 MEQ PO TBCR
40.0000 meq | EXTENDED_RELEASE_TABLET | ORAL | Status: AC
  Administered 2021-04-16: 40 meq via ORAL
  Filled 2021-04-16: qty 2

## 2021-04-16 MED ORDER — SODIUM CHLORIDE 0.9% FLUSH
3.0000 mL | Freq: Two times a day (BID) | INTRAVENOUS | Status: DC
  Administered 2021-04-16 – 2021-04-18 (×3): 3 mL via INTRAVENOUS

## 2021-04-16 MED ORDER — HYDROMORPHONE HCL 1 MG/ML IJ SOLN
0.5000 mg | INTRAMUSCULAR | Status: DC | PRN
  Administered 2021-04-16 – 2021-04-17 (×2): 0.5 mg via INTRAVENOUS
  Filled 2021-04-16 (×2): qty 1

## 2021-04-16 MED ORDER — ALBUTEROL SULFATE (2.5 MG/3ML) 0.083% IN NEBU: 2.5000 mg | INHALATION_SOLUTION | Freq: Four times a day (QID) | RESPIRATORY_TRACT | Status: DC | PRN

## 2021-04-16 MED ORDER — FENTANYL CITRATE PF 50 MCG/ML IJ SOSY
50.0000 ug | PREFILLED_SYRINGE | Freq: Once | INTRAMUSCULAR | Status: AC
  Administered 2021-04-16: 50 ug via INTRAVENOUS
  Filled 2021-04-16: qty 1

## 2021-04-16 MED ORDER — IOHEXOL 300 MG/ML  SOLN
100.0000 mL | Freq: Once | INTRAMUSCULAR | Status: AC | PRN
  Administered 2021-04-16: 100 mL via INTRAVENOUS

## 2021-04-16 MED ORDER — ENOXAPARIN SODIUM 60 MG/0.6ML IJ SOSY
60.0000 mg | PREFILLED_SYRINGE | INTRAMUSCULAR | Status: DC
  Administered 2021-04-16 – 2021-04-18 (×3): 60 mg via SUBCUTANEOUS
  Filled 2021-04-16 (×4): qty 0.6

## 2021-04-16 MED ORDER — OXYCODONE-ACETAMINOPHEN 5-325 MG PO TABS
1.0000 | ORAL_TABLET | Freq: Four times a day (QID) | ORAL | Status: DC | PRN
  Administered 2021-04-16 – 2021-04-18 (×5): 1 via ORAL
  Filled 2021-04-16 (×5): qty 1

## 2021-04-16 MED ORDER — HYDROMORPHONE HCL 1 MG/ML IJ SOLN
1.0000 mg | Freq: Once | INTRAMUSCULAR | Status: AC
  Administered 2021-04-16: 1 mg via INTRAVENOUS
  Filled 2021-04-16: qty 1

## 2021-04-16 MED ORDER — ACETAMINOPHEN 650 MG RE SUPP: 650.0000 mg | Freq: Four times a day (QID) | RECTAL | Status: DC | PRN

## 2021-04-16 NOTE — ED Notes (Signed)
Ortho consulted

## 2021-04-16 NOTE — ED Provider Triage Note (Signed)
Emergency Medicine Provider Triage Evaluation Note  Ann Graham , a 66 y.o. female  was evaluated in triage.  Pt complains of a fall that occurred earlier today.  Patient states she was replacing a shower curtain and was standing on a stool.  She lost her balance and fell.  She states that she has had a low back pain as well as abdominal pain that has been progressively worsening since the fall.  Denies any head trauma or LOC.  No neck pain.  Physical Exam  BP (!) 174/61    Pulse 71    Temp 98.8 F (37.1 C) (Oral)    Resp 16    Ht 5\' 3"  (1.6 m)    Wt 117.9 kg    SpO2 100%    BMI 46.06 kg/m  Gen:   Awake, no distress   Resp:  Normal effort  MSK:   Moves extremities without difficulty  Other:  Mild diffuse tenderness along the midline lumbar spine.  Additional moderate tenderness to the right lumbar musculature.  Protuberant abdomen that is soft.  Moderate tenderness noted along the right side of the abdomen.  Medical Decision Making  Medically screening exam initiated at 2:37 AM.  Appropriate orders placed.  Beverely Risen was informed that the remainder of the evaluation will be completed by another provider, this initial triage assessment does not replace that evaluation, and the importance of remaining in the ED until their evaluation is complete.   Vora, Cl[place], PA-C 04/16/21 [phone]

## 2021-04-16 NOTE — H&P (Addendum)
History and Physical    Ann Graham GMW:[id number] DOB: [birth year] DOA: 04/16/2021  Referring MD/NP/PA: Calvert Cantor, MD PCP: Kristen Loader, FNP  Patient coming from: Home via EMS  Chief Complaint: Fall  I have personally briefly reviewed patient's old medical records in [place]   HPI: Ann Graham is a 66 y.o. female with medical history significant of hypertension, hyperlipidemia, budd chiari syndrome, and frequent falls presents after having a fall at home.  She had been in the process of trying to change her shower curtain yesterday when she fell landing on her back.  Denies any loss of consciousness, but  complained of having severe pain in her lower lower back.  She was able to crawl and call for help.  Denies having any radiation of the pain in her back.  She lives at home alone and reports that she has a long history of frequent falls even as a kid.  States that she does not know if her equilibrium is off or she is just clumsy.  Denies any recent fever, chills, chest pain, nausea, vomiting, or diarrhea.  ED Course: Upon admission to the emergency department patient was seen to be afebrile, respirations 16-29, blood pressure 149/66-185/85, and O2 saturation maintained on room air.  Labs noted potassium 3.2 and calcium 8.8.  Influenza and COVID-19 screening were negative.  CT scan of the cervical spine noted communicated L1 compression fracture with 40% loss of vertebral body height.  Neurosurgery had been consulted and recommended outpatient follow-up.  Patient has been fitted with a TLSO brace.  Patient has been given Dilaudid and fentanyl without improvement in pain symptoms.  Review of Systems  Musculoskeletal:  Positive for back pain and falls.   Past Medical History:  Diagnosis Date   Anxiety    Budd-Chiari syndrome (HCC)    Elevated cholesterol    Hx of cardiovascular stress test    Lexiscan Myoview 4/14:  No ischemia, EF 75%.   Hx of echocardiogram    Echo  3/14: EF 55-60%, Gr 1 DD, mild LAE   Hypertension     Past Surgical History:  Procedure Laterality Date   DILATION AND CURETTAGE OF UTERUS       reports that she has never smoked. She has never used smokeless tobacco. She reports current alcohol use. She reports that she does not use drugs.  Allergies  Allergen Reactions   Hydrocodone Hives   Lisinopril     Congestion, fluid build up    Family History  Problem Relation Age of Onset   Heart disease Mother    Heart disease Father    Colon cancer Father    Heart disease Brother     Prior to Admission medications   Medication Sig Start Date End Date Taking? Authorizing Provider  albuterol (VENTOLIN HFA) 108 (90 Base) MCG/ACT inhaler Inhale 2 puffs into the lungs every 6 (six) hours as needed for wheezing or shortness of breath. 07/16/20   Hunsucker, Bonna Gains, MD  aspirin EC 81 MG EC tablet Take 1 tablet (81 mg total) by mouth daily. 06/14/12   Oswald Hillock, MD  atorvastatin (LIPITOR) 20 MG tablet Take 20 mg by mouth daily.    [provider]  budesonide-formoterol (SYMBICORT) 160-4.5 MCG/ACT inhaler Inhale 2 puffs into the lungs in the morning and at bedtime. 07/16/20   Hunsucker, Bonna Gains, MD  cetirizine (ZYRTEC ALLERGY) 10 MG tablet Take 1 tablet (10 mg total) by mouth daily. 06/23/20  07/23/20  Alfredia Client, PA-C  fluticasone (FLONASE) 50 MCG/ACT nasal spray Place 2 sprays into both nostrils daily. 06/23/20 07/23/20  Alfredia Client, PA-C  furosemide (LASIX) 20 MG tablet Take 20-40 mg by mouth daily. 06/14/12   Oswald Hillock, MD  traMADol (ULTRAM) 50 MG tablet Take 1 tablet (50 mg total) by mouth every 6 (six) hours as needed. 08/17/18   Drenda Freeze, MD  traZODone (DESYREL) 50 MG tablet Take 50 mg by mouth at bedtime.    [provider]    Physical Exam:  Constitutional: Elderly female appears to be in no acute distress Vitals:   04/16/21 1100 04/16/21 1130 04/16/21 1301 04/16/21 1430  BP: (!) 174/68 (!) 169/72  (!) 152/65 (!) 156/59  Pulse: 69 66 69 69  Resp:  18  16  Temp:      TempSrc:      SpO2: 99% 99% 100% 95%  Weight:      Height:       Eyes: PERRL, lids and conjunctivae normal ENMT: Mucous membranes are moist. Posterior pharynx clear of any exudate or lesions.  Neck: normal, supple, no masses, no thyromegaly Respiratory: clear to auscultation bilaterally, no wheezing, no crackles. Normal respiratory effort. No accessory muscle use.  Cardiovascular: Regular rate with intermittent PVCs. Abdomen: no tenderness, no masses palpated. No hepatosplenomegaly. Bowel sounds positive.  Musculoskeletal: no clubbing / cyanosis.  TLSO brace currently in place.  No significant lower extremity swelling at this time. Skin: no rashes, lesions, ulcers. No induration Neurologic: CN 2-12 grossly intact. Sensation intact, DTR normal. Strength 5/5 in all 4.  Psychiatric: Normal judgment and insight. Alert and oriented x 3. Normal mood.     Labs on Admission: I have personally reviewed following labs and imaging studies  CBC: Recent Labs  Lab 04/16/21 0248  WBC 9.1  NEUTROABS 7.2  HGB 12.5  HCT 38.0  MCV 83.2  PLT 277   Basic Metabolic Panel: Recent Labs  Lab 04/16/21 0248  NA 138  K 3.2*  CL 101  CO2 26  GLUCOSE 114*  BUN 18  CREATININE 0.74  CALCIUM 8.8*   GFR: Estimated Creatinine Clearance: 84.7 mL/min (by C-G formula based on SCr of 0.74 mg/dL). Liver Function Tests: Recent Labs  Lab 04/16/21 0248  AST 26  ALT 19  ALKPHOS 79  BILITOT 1.1  PROT 7.0  ALBUMIN 3.5   No results for input(s): LIPASE, AMYLASE in the last 168 hours. No results for input(s): AMMONIA in the last 168 hours. Coagulation Profile: No results for input(s): INR, PROTIME in the last 168 hours. Cardiac Enzymes: No results for input(s): CKTOTAL, CKMB, CKMBINDEX, TROPONINI in the last 168 hours. BNP (last 3 results) No results for input(s): PROBNP in the last 8760 hours. HbA1C: No results for input(s):  HGBA1C in the last 72 hours. CBG: No results for input(s): GLUCAP in the last 168 hours. Lipid Profile: No results for input(s): CHOL, HDL, LDLCALC, TRIG, CHOLHDL, LDLDIRECT in the last 72 hours. Thyroid Function Tests: No results for input(s): TSH, T4TOTAL, FREET4, T3FREE, THYROIDAB in the last 72 hours. Anemia Panel: No results for input(s): VITAMINB12, FOLATE, FERRITIN, TIBC, IRON, RETICCTPCT in the last 72 hours. Urine analysis: No results found for: COLORURINE, APPEARANCEUR, LABSPEC, PHURINE, GLUCOSEU, HGBUR, BILIRUBINUR, KETONESUR, PROTEINUR, UROBILINOGEN, NITRITE, LEUKOCYTESUR Sepsis Labs: Recent Results (from the past 240 hour(s))  Resp Panel by RT-PCR (Flu A&B, Covid) Nasopharyngeal Swab     Status: None   Collection Time: 04/16/21  1:55 PM  Specimen: Nasopharyngeal Swab; Nasopharyngeal(NP) swabs in vial transport medium  Result Value Ref Range Status   SARS Coronavirus 2 by RT PCR NEGATIVE NEGATIVE Final    Comment: (NOTE) SARS-CoV-2 target nucleic acids are NOT DETECTED.  The SARS-CoV-2 RNA is generally detectable in upper respiratory specimens during the acute phase of infection. The lowest concentration of SARS-CoV-2 viral copies this assay can detect is 138 copies/mL. A negative result does not preclude SARS-Cov-2 infection and should not be used as the sole basis for treatment or other patient management decisions. A negative result may occur with  improper specimen collection/handling, submission of specimen other than nasopharyngeal swab, presence of viral mutation(s) within the areas targeted by this assay, and inadequate number of viral copies(<138 copies/mL). A negative result must be combined with clinical observations, patient history, and epidemiological information. The expected result is Negative.  Fact Sheet for Patients:  EntrepreneurPulse.com.au  Fact Sheet for Healthcare Providers:  IncredibleEmployment.be  This  test is no t yet approved or cleared by the [place] FDA and  has been authorized for detection and/or diagnosis of SARS-CoV-2 by FDA under an Emergency Use Authorization (EUA). This EUA will remain  in effect (meaning this test can be used) for the duration of the COVID-19 declaration under Section 564(b)(1) of the Act, 21 U.S.C.section 360bbb-3(b)(1), unless the authorization is terminated  or revoked sooner.       Influenza A by PCR NEGATIVE NEGATIVE Final   Influenza B by PCR NEGATIVE NEGATIVE Final    Comment: (NOTE) The Xpert Xpress SARS-CoV-2/FLU/RSV plus assay is intended as an aid in the diagnosis of influenza from Nasopharyngeal swab specimens and should not be used as a sole basis for treatment. Nasal washings and aspirates are unacceptable for Xpert Xpress SARS-CoV-2/FLU/RSV testing.  Fact Sheet for Patients: EntrepreneurPulse.com.au  Fact Sheet for Healthcare Providers: IncredibleEmployment.be  This test is not yet approved or cleared by the [place] FDA and has been authorized for detection and/or diagnosis of SARS-CoV-2 by FDA under an Emergency Use Authorization (EUA). This EUA will remain in effect (meaning this test can be used) for the duration of the COVID-19 declaration under Section 564(b)(1) of the Act, 21 U.S.C. section 360bbb-3(b)(1), unless the authorization is terminated or revoked.  Performed at [facility], [place] [address], [place], [place] [address]      Radiological Exams on Admission: CT Head Wo Contrast  Result Date: 04/16/2021 CLINICAL DATA:  [place] EXAM: CT HEAD WITHOUT CONTRAST TECHNIQUE: Contiguous axial images were obtained from the base of the skull through the vertex without intravenous contrast. RADIATION DOSE REDUCTION: This exam was performed according to the departmental dose-optimization program which includes automated exposure control, adjustment of the mA and/or kV according to  patient size and/or use of iterative reconstruction technique. COMPARISON:  08/21/2018 FINDINGS: Brain: No evidence of acute infarction, hemorrhage, extra-axial collection or mass lesion/mass effect. Stable borderline ventriculomegaly. Vascular: No hyperdense vessel or unexpected calcification. Skull: Normal. Negative for fracture or focal lesion. Sinuses/Orbits: No acute finding. Other: None. IMPRESSION: No acute findings Electronically Signed   By: Lucrezia Europe M.D.   On: 04/16/2021 15:06   CT ABDOMEN PELVIS W CONTRAST  Result Date: 04/16/2021 CLINICAL DATA:  66 year old female status post fall with injury. EXAM: CT ABDOMEN AND PELVIS WITH CONTRAST TECHNIQUE: Multidetector CT imaging of the abdomen and pelvis was performed using the standard protocol following bolus administration of intravenous contrast. RADIATION DOSE REDUCTION: This exam was performed according to the departmental dose-optimization program which  includes automated exposure control, adjustment of the mA and/or kV according to patient size and/or use of iterative reconstruction technique. CONTRAST:  126mL OMNIPAQUE IOHEXOL 300 MG/ML  SOLN COMPARISON:  CT Abdomen and Pelvis 04/12/2013. FINDINGS: Lower chest: Stable since 2015 and negative. Hepatobiliary: Negative liver and gallbladder. Pancreas: Negative. Spleen: Negative. Adrenals/Urinary Tract: Normal adrenal glands. Largely stable kidneys since 2015. Several tiny cortical cysts. There are several areas of chronic but new left renal cortical scarring identified. But symmetric renal enhancement and contrast excretion. No nephrolithiasis, hydronephrosis or hydroureter. Unremarkable bladder. Occasional chronic pelvic phleboliths. Stomach/Bowel: Redundant but otherwise negative large bowel. The cecum is on a lax mesentery located in the anterior right abdomen. Normal appendix visible on coronal image 44. Decompressed terminal ileum. No dilated small bowel. Decompressed stomach and duodenum. No  free air, free fluid, or mesenteric inflammation identified. Vascular/Lymphatic: Aortoiliac calcified atherosclerosis. Normal caliber abdominal aorta. Major arterial structures are patent. No lymphadenopathy identified. Reproductive: Negative. Other: No pelvic free fluid. Musculoskeletal: Lumbar spine today reported separately. No other acute osseous abnormality identified. IMPRESSION: 1. Abnormal lumbar spine is reported separately. 2. No other acute traumatic injury identified in the abdomen or pelvis. Aortic Atherosclerosis (ICD10-I70.0). Electronically Signed   By: Genevie Ann M.D.   On: 04/16/2021 04:26   CT L-SPINE NO CHARGE  Result Date: 04/16/2021 CLINICAL DATA:  66 year old female status post fall with low back injury. EXAM: CT LUMBAR SPINE WITH CONTRAST TECHNIQUE: Technique: Multiplanar CT images of the lumbar spine were reconstructed from contemporary CT of the Abdomen and Pelvis. RADIATION DOSE REDUCTION: This exam was performed according to the departmental dose-optimization program which includes automated exposure control, adjustment of the mA and/or kV according to patient size and/or use of iterative reconstruction technique. CONTRAST:  No additional COMPARISON:  CT Abdomen and Pelvis today are reported separately. CT Abdomen and Pelvis 04/12/2013. FINDINGS: Segmentation: Normal, with hypoplastic ribs at T12. Alignment: New grade 1 anterolisthesis of L4 on L5 since 2015. Otherwise preserved lumbar lordosis. Vertebrae: Comminuted compression fracture of the L1 body. 44% loss of vertebral body height. Mild anterior displacement of endplate and anterior vertebral body comminution fragments, but only minimal retropulsion. L1 pedicles and posterior elements remain intact. No L1 level spinal or foraminal stenosis. Visible lower thoracic levels appears stable and intact. L2 through L5 appear intact. Visible sacrum and SI joints appear intact. Paraspinal and other soft tissues: Faint ventral paraspinal soft  tissue stranding or contusion. Otherwise negative lumbar paraspinal soft tissues. Abdominal and pelvic viscera are reported separately. Disc levels: Progressed lower lumbar facet arthropathy since 2015 with grade 1 anterolisthesis of L4 on L5. Circumferential disc/pseudo disc bulge at that level but otherwise stable disc spaces. Severe L4-L5 facet hypertrophy with vacuum facet. Mild if any associated spinal stenosis. Chronic disc space loss and vacuum disc at L5-S1 with circumferential disc osteophyte complex. No significant spinal stenosis. Mild to moderate chronic L5 foraminal stenosis has not significantly changed. IMPRESSION: 1. Comminuted L1 compression fracture with 44% loss of vertebral body height. Minimal retropulsion with no associated spinal stenosis or other complicating features. 2. No other acute traumatic injury identified in the lumbar spine. 3. Progressed lumbar facet arthropathy since 2015 with new grade 1 anterolisthesis of L4 on L5. But mild if any associated degenerative lumbar spinal stenosis. Stable chronic L5 foraminal stenosis. 4. CT Abdomen and Pelvis today reported separately. Electronically Signed   By: Genevie Ann M.D.   On: 04/16/2021 04:32    EKG: Independently reviewed.  Sinus rhythm with ventricular  bigeminy at 85 bpm  Assessment/Plan L1 fracture compression fracture secondary to fall: Acute.  Patient presents after fall at home.  Noted to have a L1 compression fracture with 44% height loss.  Neurosurgery had been consulted, but recommended outpatient follow-up. -Admit to a medical telemetry bed -Continue TLSO brace -Oxycodone/hydromorphone IV as needed for moderate to severe pain expected -PT/OT to eval and treat -Transitions of care consulted -Consider need to reconsult neurosurgery if pain is not able to be adequately controlled.  Otherwise have patient follow-up in outpatient setting as previously recommended  Hypokalemia and hypocalcemia: Acute.  Initial labs note  potassium 3.2 and calcium 8.8. -Give potassium chloride 40 meq p.o. -Give calcium gluconate 1 g IV -Continue to monitor and replace as needed  Frequent falls: Patient reports that she has a long history of falling which is seen on previous ED records. -Check TSH   Ventricular bigeminy: Noted on EKG -Correcting electrolyte abnormalities -Follow-up telemetry overnight   Morbid obesity: BMI 46.06kg/m2  DVT prophylaxis: Lovenox Code Status: Full Family Communication: None Disposition Plan: To be determined Consults called: Neurosurgery Admission status: Observation  Norval Morton MD [facility]   If 7PM-7AM, please contact night-coverage   04/16/2021, 3:41 PM

## 2021-04-16 NOTE — ED Triage Notes (Signed)
Per EMS, pt reports she feel "sometime on the 30th" putting up a shower curtain.  She denies hitting her head, not on blood thinners, denies LOC.  C/O back pain midline of lumbar region.  Not complaint w/ home medications.  142/90 63pulse 96% RA

## 2021-04-16 NOTE — ED Notes (Signed)
ED lab called to draw admission labs.

## 2021-04-16 NOTE — Progress Notes (Signed)
CSW met with Pt at bedside while daughter, Morey Hummingbird was on Pt's speakerphone. CSW explained SNF placement process and gathered info for [facility].  SNF referrals faxed to several area facilities.

## 2021-04-16 NOTE — ED Provider Notes (Signed)
[facility] EMERGENCY DEPARTMENT Provider Note    CSN: [id number] Arrival date & time: 04/16/21 9509  History Chief Complaint  Patient presents with   Ann Graham is a 66 y.o. female reports she was standing on a stepstool installing a shower curtain yesterday when she fell landing on her back, denies head injury. Complaining of severe lower back pain, worse with movement. Does not take blood thinners. Called EMS to transport to the ED. Has had continued pain while awaiting ED exam room. Pain does not radiating into her leg. No weakness or numbness.    Home Medications Prior to Admission medications   Medication Sig Start Date End Date Taking? Authorizing Provider  albuterol (VENTOLIN HFA) 108 (90 Base) MCG/ACT inhaler Inhale 2 puffs into the lungs every 6 (six) hours as needed for wheezing or shortness of breath. 07/16/20   Hunsucker, Bonna Gains, MD  aspirin EC 81 MG EC tablet Take 1 tablet (81 mg total) by mouth daily. 06/14/12   Oswald Hillock, MD  atorvastatin (LIPITOR) 20 MG tablet Take 20 mg by mouth daily.    [provider]  budesonide-formoterol (SYMBICORT) 160-4.5 MCG/ACT inhaler Inhale 2 puffs into the lungs in the morning and at bedtime. 07/16/20   Hunsucker, Bonna Gains, MD  cetirizine (ZYRTEC ALLERGY) 10 MG tablet Take 1 tablet (10 mg total) by mouth daily. 06/23/20 07/23/20  Alfredia Client, PA-C  fluticasone (FLONASE) 50 MCG/ACT nasal spray Place 2 sprays into both nostrils daily. 06/23/20 07/23/20  Alfredia Client, PA-C  furosemide (LASIX) 20 MG tablet Take 20-40 mg by mouth daily. 06/14/12   Oswald Hillock, MD  traMADol (ULTRAM) 50 MG tablet Take 1 tablet (50 mg total) by mouth every 6 (six) hours as needed. 08/17/18   Drenda Freeze, MD  traZODone (DESYREL) 50 MG tablet Take 50 mg by mouth at bedtime.    [provider]     Allergies    Hydrocodone and Lisinopril   Review of Systems   Review of Systems Please see HPI for pertinent positives and  negatives  Physical Exam BP (!) 156/59 (BP Location: Right Arm)    Pulse 69    Temp 98.8 F (37.1 C) (Oral)    Resp 16    Ht 5\' 3"  (1.6 m)    Wt 117.9 kg    SpO2 95%    BMI 46.06 kg/m   Physical Exam Vitals and nursing note reviewed.  Constitutional:      Appearance: Normal appearance.  HENT:     Head: Normocephalic and atraumatic.     Nose: Nose normal.     Mouth/Throat:     Mouth: Mucous membranes are moist.  Eyes:     Extraocular Movements: Extraocular movements intact.     Conjunctiva/sclera: Conjunctivae normal.  Cardiovascular:     Rate and Rhythm: Normal rate.  Pulmonary:     Effort: Pulmonary effort is normal.     Breath sounds: Normal breath sounds.  Abdominal:     General: Abdomen is flat.     Palpations: Abdomen is soft.     Tenderness: There is no abdominal tenderness.  Musculoskeletal:        General: Tenderness (mid-lumbar area) present. No swelling. Normal range of motion.     Cervical back: Neck supple.  Skin:    General: Skin is warm and dry.  Neurological:     General: No focal deficit present.     Mental Status: She is  alert.     Cranial Nerves: No cranial nerve deficit.     Sensory: No sensory deficit.     Motor: No weakness.  Psychiatric:        Mood and Affect: Mood normal.    ED Results / Procedures / Treatments   EKG None  Procedures Procedures  Medications Ordered in the ED Medications  iohexol (OMNIPAQUE) 300 MG/ML solution 100 mL (100 mLs Intravenous Contrast Given 04/16/21 0413)  fentaNYL (SUBLIMAZE) injection 50 mcg (50 mcg Intravenous Given 04/16/21 0920)  HYDROmorphone (DILAUDID) injection 1 mg (1 mg Intravenous Given 04/16/21 1035)    Initial Impression and Plan  Patient with mechanical fall, onto her back. Here with low back pain. Workup done in triage reviewed. CBC, CMP are unremarkable. CT shows a comminuted L1 compression fracture. Fentanyl for pain, will discuss with Neurosurgery.   ED Course   Clinical Course as of  04/16/21 1549  Tue Apr 16, 2021  1101 Spoke with Dr. Kathyrn Sheriff, Neurosurg who has reviewed the images. Recommend TLSO (or Lumbar corsett if unable to fit TLSO), pain medications and follow up in his office.  [CS]  1221 Patient is concerned for frequent falls and living alone. She has her TLSO and would like an evaluation by PT to see what her home health needs may be.  [CS]  9604 PT reports patient unable to stand or walk without max assist. Daughter at bedside reports she has had increased falls recently she hasn't reported and is unsteady on her feet at home with a shuffling gait. Will check labs, CT head and anticipate medical admission.  [CS]  1518 Covid is neg. CT head is neg. Will discuss with Medicine for admission.  [CS]  1544 Spoke with Dr. Harvest Forest, Hospitalist, who will evaluate for admission.  [CS]    Clinical Course User Index [CS] Truddie Hidden, MD     MDM Rules/Calculators/A&P Medical Decision Making Problems Addressed: Closed compression fracture of body of L1 vertebra [facility]): acute illness or injury that poses a threat to life or bodily functions Fall, initial encounter: acute illness or injury  Amount and/or Complexity of Data Reviewed Labs: ordered. Decision-making details documented in ED Course. Radiology: ordered and independent interpretation performed. Decision-making details documented in ED Course.  Risk Prescription drug management. Decision regarding hospitalization.    Final Clinical Impression(s) / ED Diagnoses Final diagnoses:  Fall, initial encounter  Closed compression fracture of body of L1 vertebra [facility])    Rx / DC Orders ED Discharge Orders     None        Truddie Hidden, MD 04/16/21 1549

## 2021-04-16 NOTE — Evaluation (Signed)
Physical Therapy Evaluation Patient Details Name: Ann Graham MRN: [id number] DOB: 1955-12-06 Today's Date: 04/16/2021  History of Present Illness  Pt is a 66 y/o female presenting to the ED 1/31 following fall off of step stool. Found to have L1 compression fx.  Clinical Impression  Pt admitted secondary to problem above with deficits below. Pt with increased pain and required max A +2 for bed mobility tasks this session. Once sitting, pt with posterior and R lateral lean, requiring at least mod A for stability. Pt previously independent with mobility tasks. Given new mobility deficits, recommending SNF level therapies at d/c to increase independence prior to return home. Will continue to follow acutely.        Recommendations for follow up therapy are one component of a multi-disciplinary discharge planning process, led by the attending physician.  Recommendations may be updated based on patient status, additional functional criteria and insurance authorization.  Follow Up Recommendations Skilled nursing-short term rehab (<3 hours/day)    Assistance Recommended at Discharge Frequent or constant Supervision/Assistance  Patient can return home with the following  Two people to help with walking and/or transfers;Two people to help with bathing/dressing/bathroom;Help with stairs or ramp for entrance;Assist for transportation;Assistance with Forensic psychologist cushion (measurements PT);Wheelchair (measurements PT);Hospital bed  Recommendations for Other Services       Functional Status Assessment Patient has had a recent decline in their functional status and demonstrates the ability to make significant improvements in function in a reasonable and predictable amount of time.     Precautions / Restrictions Precautions Precautions: Fall;Back Precaution Booklet Issued: No Precaution Comments: Verbally reviewed back precautions Required Braces or  Orthoses: Spinal Brace Spinal Brace: Thoracolumbosacral orthotic Restrictions Weight Bearing Restrictions: No      Mobility  Bed Mobility Overal bed mobility: Needs Assistance Bed Mobility: Rolling, Sidelying to Sit Rolling: Max assist, +2 for physical assistance Sidelying to sit: Max assist, +2 for physical assistance       General bed mobility comments: Max A +2 for assist with rolling and trunk/LE assist to come to sitting. Once sitting, pt with posterior and R lateral lean and unsafe to attempt further mobility.    Transfers                        Ambulation/Gait                  Stairs            Wheelchair Mobility    Modified Rankin (Stroke Patients Only)       Balance Overall balance assessment: Needs assistance Sitting-balance support: Bilateral upper extremity supported Sitting balance-Leahy Scale: Poor Sitting balance - Comments: Reliant on at least mod A to maintain sitting. Pt with posterior and R lateral lean. Postural control: Posterior lean, Right lateral lean                                   Pertinent Vitals/Pain Pain Assessment Pain Assessment: Faces Faces Pain Scale: Hurts worst Pain Location: back Pain Descriptors / Indicators: Grimacing, Guarding, Moaning Pain Intervention(s): Limited activity within patient's tolerance, Monitored during session, Repositioned    Home Living Family/patient expects to be discharged to:: Private residence Living Arrangements: Alone Available Help at Discharge: Family;Available PRN/intermittently (daughter lives 30 mins away) Type of Home: House Home Access: Stairs to enter Entrance Stairs-Rails: Right;Left Entrance  Stairs-Number of Steps: 4-5   Home Layout: One level Home Equipment: None      Prior Function Prior Level of Function : Independent/Modified Independent             Mobility Comments: Reports independence with mobility tasks.       Hand  Dominance        Extremity/Trunk Assessment   Upper Extremity Assessment Upper Extremity Assessment: Defer to OT evaluation    Lower Extremity Assessment Lower Extremity Assessment: Generalized weakness    Cervical / Trunk Assessment Cervical / Trunk Assessment: Other exceptions Cervical / Trunk Exceptions: L1 compression fx  Communication   Communication: No difficulties  Cognition Arousal/Alertness: Awake/alert Behavior During Therapy: WFL for tasks assessed/performed Overall Cognitive Status: Impaired/Different from baseline Area of Impairment: Safety/judgement                         Safety/Judgement: Decreased awareness of deficits, Decreased awareness of safety              General Comments General comments (skin integrity, edema, etc.): Pt's daughter present and very concerned about pt returning home alone    Exercises     Assessment/Plan    PT Assessment Patient needs continued PT services  PT Problem List Decreased strength;Decreased activity tolerance;Decreased balance;Decreased mobility;Decreased knowledge of use of DME;Decreased knowledge of precautions;Pain       PT Treatment Interventions DME instruction;Gait training;Stair training;Functional mobility training;Therapeutic exercise;Therapeutic activities;Balance training;Patient/family education    PT Goals (Current goals can be found in the Care Plan section)  Acute Rehab PT Goals Patient Stated Goal: to be independent before going home PT Goal Formulation: With patient Time For Goal Achievement: 04/30/21 Potential to Achieve Goals: Good    Frequency Min 3X/week     Co-evaluation               AM-PAC PT "6 Clicks" Mobility  Outcome Measure Help needed turning from your back to your side while in a flat bed without using bedrails?: Total Help needed moving from lying on your back to sitting on the side of a flat bed without using bedrails?: Total Help needed moving to and  from a bed to a chair (including a wheelchair)?: Total Help needed standing up from a chair using your arms (e.g., wheelchair or bedside chair)?: Total Help needed to walk in hospital room?: Total Help needed climbing 3-5 steps with a railing? : Total 6 Click Score: 6    End of Session Equipment Utilized During Treatment: Back brace Activity Tolerance: Patient limited by pain Patient left: in bed;with call bell/phone within reach;with family/visitor present (on stretcher in ED) Nurse Communication: Mobility status PT Visit Diagnosis: Unsteadiness on feet (R26.81);Muscle weakness (generalized) (M62.81);Difficulty in walking, not elsewhere classified (R26.2)    Time: 1157-2620 PT Time Calculation (min) (ACUTE ONLY): 20 min   Charges:   PT Evaluation $PT Eval Moderate Complexity: 1 Mod          Reuel Derby, PT, DPT  Acute Rehabilitation Services  Pager: [phone] Office: [phone]   Rudean Hitt 04/16/2021, 1:36 PM

## 2021-04-16 NOTE — Progress Notes (Signed)
Orthopedic Tech Progress Note Patient Details:  Ann Graham 01-17-1956 [id number]   Called in order to [employer] for a TLSO BRACE for patient who's due for discharge   Patient ID: Ann Graham, female   DOB: December 17, 1955, 66 y.o.   MRN: [id number]  Ann Graham 04/16/2021, 11:30 AM

## 2021-04-16 NOTE — ED Notes (Signed)
Lunch tray ordered, PT at the bedside

## 2021-04-16 NOTE — NC FL2 (Signed)
[place] LEVEL OF CARE SCREENING TOOL     IDENTIFICATION  Patient Name: Ann Graham Birthdate: 12-Mar-1956 Sex: female Admission Date (Current Location): 04/16/2021  [facility] and [place] Number:  Herbalist and Address:  [facility] [place] [address] [place], [place] [address]      Provider Number: [id number]  Attending Physician Name and Address:  Norval Morton, MD  Relative Name and Phone Number:  [place] Daughter [phone]    Current Level of Care: SNF Recommended Level of Care: [place] Prior Approval Number:    Date Approved/Denied:   PASRR Number: [id number]  Discharge Plan: SNF    Current Diagnoses: Patient Active Problem List   Diagnosis Date Noted   Compression fracture of L1 lumbar vertebra ([place]) 74/02/8785   Diastolic dysfunction 76/72/0947   Chest pain at rest 06/13/2012   Hypokalemia 06/13/2012   Cardiomegaly 06/13/2012   HYPERLIPIDEMIA 08/12/2008   OBESITY 08/12/2008   HYPERTENSION 08/12/2008    Orientation RESPIRATION BLADDER Height & Weight     Self, Time, Situation, Place  Normal Continent Weight: 260 lb (117.9 kg) Height:  5\' 3"  (160 cm)  BEHAVIORAL SYMPTOMS/MOOD NEUROLOGICAL BOWEL NUTRITION STATUS      Continent Diet (Heart healthy)  AMBULATORY STATUS COMMUNICATION OF NEEDS Skin   Limited Assist Verbally                         Personal Care Assistance Level of Assistance  Bathing, Feeding, Dressing Bathing Assistance: Limited assistance Feeding assistance: Independent Dressing Assistance: Limited assistance     Functional Limitations Info  Sight, Hearing, Speech Sight Info: Adequate Hearing Info: Adequate Speech Info: Adequate    SPECIAL CARE FACTORS FREQUENCY  PT (By licensed PT), OT (By licensed OT)     PT Frequency: 5 x weekly OT Frequency: 5 x weekly            Contractures Contractures Info: Not present    Additional Factors Info   Code Status, Allergies (Height and Weight 5'3" 260lbs) Code Status Info: Full Allergies Info: Hydrocodone,Naproxen Sodium,Lisinopril           Current Medications (04/16/2021):  This is the current hospital active medication list Current Facility-Administered Medications  Medication Dose Route Frequency Provider Last Rate Last Admin   acetaminophen (TYLENOL) tablet 650 mg  650 mg Oral Q6H PRN Norval Morton, MD       Or   acetaminophen (TYLENOL) suppository 650 mg  650 mg Rectal Q6H PRN Fuller Plan A, MD       albuterol (PROVENTIL) (2.5 MG/3ML) 0.083% nebulizer solution 2.5 mg  2.5 mg Nebulization Q6H PRN Tamala Julian, Rondell A, MD       enoxaparin (LOVENOX) injection 60 mg  60 mg Subcutaneous Q24H Smith, Rondell A, MD   60 mg at 04/16/21 1825   HYDROmorphone (DILAUDID) injection 0.5 mg  0.5 mg Intravenous Q3H PRN Fuller Plan A, MD   0.5 mg at 04/16/21 1826   oxyCODONE-acetaminophen (PERCOCET/ROXICET) 5-325 MG per tablet 1 tablet  1 tablet Oral Q6H PRN Fuller Plan A, MD       sodium chloride flush (NS) 0.9 % injection 3 mL  3 mL Intravenous Q12H Smith, Rondell A, MD       Current Outpatient Medications  Medication Sig Dispense Refill   albuterol (VENTOLIN HFA) 108 (90 Base) MCG/ACT inhaler Inhale 2 puffs into the lungs every 6 (six) hours as needed for  wheezing or shortness of breath. 1 each 11   cetirizine (ZYRTEC ALLERGY) 10 MG tablet Take 1 tablet (10 mg total) by mouth daily. 30 tablet 0   diclofenac (VOLTAREN) 75 MG EC tablet Take 75 mg by mouth 2 (two) times daily.     Famotidine (PEPCID PO) Take 20 mg by mouth as needed (heartburn).     furosemide (LASIX) 20 MG tablet Take 20 mg by mouth daily.     Menthol-Methyl Salicylate (SALONPAS PAIN RELIEF PATCH) PTCH Apply 1 patch topically as needed (Pain Relief).     Potassium Chloride ER 20 MEQ TBCR Take 1 tablet by mouth daily.     promethazine-dextromethorphan (PROMETHAZINE-DM) 6.25-15 MG/5ML syrup Take 10 mLs by mouth every 6  (six) hours as needed.     rosuvastatin (CRESTOR) 10 MG tablet Take 10 mg by mouth daily.     aspirin EC 81 MG EC tablet Take 1 tablet (81 mg total) by mouth daily. (Patient not taking: Reported on 04/16/2021) 30 tablet 2   atorvastatin (LIPITOR) 20 MG tablet Take 20 mg by mouth daily. (Patient not taking: Reported on 04/16/2021)     budesonide-formoterol (SYMBICORT) 160-4.5 MCG/ACT inhaler Inhale 2 puffs into the lungs in the morning and at bedtime. (Patient not taking: Reported on 04/16/2021) 1 each 12   fluticasone (FLONASE) 50 MCG/ACT nasal spray Place 2 sprays into both nostrils daily. (Patient not taking: Reported on 04/16/2021) 11.1 mL 0   traMADol (ULTRAM) 50 MG tablet Take 1 tablet (50 mg total) by mouth every 6 (six) hours as needed. (Patient not taking: Reported on 04/16/2021) 10 tablet 0   traZODone (DESYREL) 50 MG tablet Take 50 mg by mouth at bedtime. (Patient not taking: Reported on 04/16/2021)       Discharge Medications: Please see discharge summary for a list of discharge medications.  Relevant Imaging Results:  Relevant Lab Results:   Additional Information SSN# [id number]/ Pt has not received Covid vaccines/ Height and Weight 5'3" 260 lbs  Katherin Ramey, LCSW

## 2021-04-16 NOTE — Progress Notes (Signed)
°   04/16/21 1151  TOC ED Mini Assessment  TOC Time spent with patient (minutes): 45  PING Used in TOC Assessment No  Admission or Readmission Diverted Yes  Interventions which prevented an admission or readmission [place] or Services  What brought you to the Emergency Department?  Fall  Barriers to Discharge Continued Medical Work up  [employer] Referred pt to [facility] for Home Health needs; Sharmon Revere accepted referral  Patient states their goals for this hospitalization and ongoing recovery are: Go home safely

## 2021-04-16 NOTE — ED Notes (Signed)
Pt daughter is concerned that pt is unsafe living at home by herself because she has had 4 falls in the past 3-4 weeks. Pt lives alone.

## 2021-04-16 NOTE — ED Notes (Signed)
Pts TLSO brace remains in place - pt brief changed and purewick in place at this time

## 2021-04-17 DIAGNOSIS — R008 Other abnormalities of heart beat: Secondary | ICD-10-CM | POA: Diagnosis present

## 2021-04-17 DIAGNOSIS — G4733 Obstructive sleep apnea (adult) (pediatric): Secondary | ICD-10-CM | POA: Diagnosis present

## 2021-04-17 DIAGNOSIS — M4856XA Collapsed vertebra, not elsewhere classified, lumbar region, initial encounter for fracture: Secondary | ICD-10-CM | POA: Diagnosis present

## 2021-04-17 DIAGNOSIS — Z79899 Other long term (current) drug therapy: Secondary | ICD-10-CM | POA: Diagnosis not present

## 2021-04-17 DIAGNOSIS — S32010A Wedge compression fracture of first lumbar vertebra, initial encounter for closed fracture: Secondary | ICD-10-CM | POA: Diagnosis present

## 2021-04-17 DIAGNOSIS — W19XXXA Unspecified fall, initial encounter: Secondary | ICD-10-CM

## 2021-04-17 DIAGNOSIS — Z888 Allergy status to other drugs, medicaments and biological substances status: Secondary | ICD-10-CM | POA: Diagnosis not present

## 2021-04-17 DIAGNOSIS — Z8249 Family history of ischemic heart disease and other diseases of the circulatory system: Secondary | ICD-10-CM | POA: Diagnosis not present

## 2021-04-17 DIAGNOSIS — Z7982 Long term (current) use of aspirin: Secondary | ICD-10-CM | POA: Diagnosis not present

## 2021-04-17 DIAGNOSIS — E876 Hypokalemia: Secondary | ICD-10-CM | POA: Diagnosis present

## 2021-04-17 DIAGNOSIS — Z886 Allergy status to analgesic agent status: Secondary | ICD-10-CM | POA: Diagnosis not present

## 2021-04-17 DIAGNOSIS — R296 Repeated falls: Secondary | ICD-10-CM | POA: Diagnosis present

## 2021-04-17 DIAGNOSIS — Z6841 Body Mass Index (BMI) 40.0 and over, adult: Secondary | ICD-10-CM | POA: Diagnosis not present

## 2021-04-17 DIAGNOSIS — I110 Hypertensive heart disease with heart failure: Secondary | ICD-10-CM | POA: Diagnosis present

## 2021-04-17 DIAGNOSIS — N390 Urinary tract infection, site not specified: Secondary | ICD-10-CM | POA: Diagnosis present

## 2021-04-17 DIAGNOSIS — I5032 Chronic diastolic (congestive) heart failure: Secondary | ICD-10-CM

## 2021-04-17 DIAGNOSIS — Z7951 Long term (current) use of inhaled steroids: Secondary | ICD-10-CM | POA: Diagnosis not present

## 2021-04-17 DIAGNOSIS — Z885 Allergy status to narcotic agent status: Secondary | ICD-10-CM | POA: Diagnosis not present

## 2021-04-17 DIAGNOSIS — E7800 Pure hypercholesterolemia, unspecified: Secondary | ICD-10-CM | POA: Diagnosis present

## 2021-04-17 DIAGNOSIS — Z20822 Contact with and (suspected) exposure to covid-19: Secondary | ICD-10-CM | POA: Diagnosis present

## 2021-04-17 DIAGNOSIS — W010XXA Fall on same level from slipping, tripping and stumbling without subsequent striking against object, initial encounter: Secondary | ICD-10-CM | POA: Diagnosis present

## 2021-04-17 LAB — BASIC METABOLIC PANEL
Anion gap: 9 (ref 5–15)
BUN: 20 mg/dL (ref 8–23)
CO2: 25 mmol/L (ref 22–32)
Calcium: 8.9 mg/dL (ref 8.9–10.3)
Chloride: 103 mmol/L (ref 98–111)
Creatinine, Ser: 0.63 mg/dL (ref 0.44–1.00)
GFR, Estimated: >60 mL/min (ref >60)
Glucose, Bld: 110 mg/dL — ABNORMAL HIGH (ref 70–99)
Potassium: 3.4 mmol/L — ABNORMAL LOW (ref 3.5–5.1)
Sodium: 137 mmol/L (ref 135–145)

## 2021-04-17 LAB — CBC
HCT: 38.2 % (ref 36.0–46.0)
Hemoglobin: 12.1 g/dL (ref 12.0–15.0)
MCH: 26.8 pg (ref 26.0–34.0)
MCHC: 31.7 g/dL (ref 30.0–36.0)
MCV: 84.7 fL (ref 80.0–100.0)
Platelets: 233 10*3/uL (ref 150–400)
RBC: 4.51 MIL/uL (ref 3.87–5.11)
RDW: 14.4 % (ref 11.5–15.5)
WBC: 10.3 10*3/uL (ref 4.0–10.5)
nRBC: 0 % (ref 0.0–0.2)

## 2021-04-17 LAB — MAGNESIUM: Magnesium: 2.4 mg/dL (ref 1.7–2.4)

## 2021-04-17 MED ORDER — POLYETHYLENE GLYCOL 3350 17 G PO PACK
17.0000 g | PACK | Freq: Every day | ORAL | Status: DC
  Administered 2021-04-17 – 2021-04-18 (×2): 17 g via ORAL
  Filled 2021-04-17 (×2): qty 1

## 2021-04-17 MED ORDER — SIMETHICONE 80 MG PO CHEW
80.0000 mg | CHEWABLE_TABLET | Freq: Four times a day (QID) | ORAL | Status: DC | PRN
  Administered 2021-04-17: 80 mg via ORAL
  Filled 2021-04-17: qty 1

## 2021-04-17 NOTE — Care Management Obs Status (Signed)
[place] NOTIFICATION   Patient Details  Name: Ann Graham MRN: [id number] Date of Birth: [birth year]   Medicare Observation Status Notification Given:  Yes    Carles Collet, RN 04/17/2021, 4:54 PM

## 2021-04-17 NOTE — TOC Initial Note (Signed)
Transition of Care [facility]) - Initial/Assessment Note    Patient Details  Name: Ann Graham MRN: [id number] Date of Birth: [birth year]  Transition of Care [facility]) CM/SW Contact:    Joanne Chars, LCSW Phone Number: 04/17/2021, 4:04 PM  Clinical Narrative:  Pt consented to SNF in ED, CSW followed up for initial assessment and updates.  Pt has 4 bed offers, which were given to pt with choice document.  Pt asking for responses from [place] and [facility].  Permission given to speak with daughters Jeani Hawking and [place].  Pt is not vaccinated for covid.                   Expected Discharge Plan: Skilled Nursing Facility Barriers to Discharge: SNF Pending bed offer   Patient Goals and CMS Choice Patient states their goals for this hospitalization and ongoing recovery are:: Go home safely CMS Medicare.gov Compare Post Acute Care list provided to:: Patient Choice offered to / list presented to : Patient  Expected Discharge Plan and Services Expected Discharge Plan: [place] Choice: [place] arrangements for the past 2 months: Single Family Home                                      Prior Living Arrangements/Services Living arrangements for the past 2 months: Single Family Home Lives with:: Self Patient language and need for interpreter reviewed:: Yes Do you feel safe going back to the place where you live?: Yes      Need for Family Participation in Patient Care: No (Comment) Care giver support system in place?: Yes (comment) Current home services: Other (comment) (none) Criminal Activity/Legal Involvement Pertinent to Current Situation/Hospitalization: No - Comment as needed  Activities of Daily Living Home Assistive Devices/Equipment: None ADL Screening (condition at time of admission) Patient's cognitive ability adequate to safely complete daily activities?: Yes Is the patient deaf or have difficulty hearing?:  Yes Does the patient have difficulty seeing, even when wearing glasses/contacts?: No Does the patient have difficulty concentrating, remembering, or making decisions?: No Patient able to express need for assistance with ADLs?: Yes Does the patient have difficulty dressing or bathing?: Yes (due to the brace) Independently performs ADLs?: Yes (appropriate for developmental age) (unsure due to brace) Does the patient have difficulty walking or climbing stairs?: Yes Weakness of Legs: None (unsure due to brace) Weakness of Arms/Hands: None  Permission Sought/Granted Permission sought to share information with : Family Supports Permission granted to share information with : Yes, Verbal Permission Granted  Share Information with NAME: daughters Jeani Hawking and [place] granted to share info w AGENCY: SNF        Emotional Assessment Appearance:: Appears stated age Attitude/Demeanor/Rapport: Engaged Affect (typically observed): Appropriate, Pleasant Orientation: :  (not recorded, appears oriented) Alcohol / Substance Use: Not Applicable Psych Involvement: No (comment)  Admission diagnosis:  Compression fracture of L1 lumbar vertebra ([place]) [U20.254Y] Fall, initial encounter [W19.XXXA] Closed compression fracture of body of L1 vertebra ([place]) [S32.010A] Patient Active Problem List   Diagnosis Date Noted   Chronic diastolic CHF (congestive heart failure) ([place]) 04/17/2021   Compression fracture of L1 lumbar vertebra ([place]) 04/16/2021   Frequent falls 04/16/2021   Obesity, Class III, BMI 40-49.9 (morbid obesity) ([place]) 04/16/2021   Hypocalcemia 04/16/2021   Ventricular bigeminy 70/62/3762   Diastolic dysfunction 83/15/1761   Chest  pain at rest 06/13/2012   Hypokalemia 06/13/2012   Cardiomegaly 06/13/2012   HYPERLIPIDEMIA 08/12/2008   OBESITY 08/12/2008   HYPERTENSION 08/12/2008   PCP:  Kristen Loader, FNP Pharmacy:   [facility] [address] [place] [address] Phone: [phone] Fax: [phone]     Social Determinants of Health (SDOH) Interventions    Readmission Risk Interventions No flowsheet data found.

## 2021-04-17 NOTE — Assessment & Plan Note (Signed)
-  BMI 46, she would benefit from weight loss

## 2021-04-17 NOTE — ED Notes (Signed)
Pt given cup of water per request.

## 2021-04-17 NOTE — Evaluation (Signed)
Occupational Therapy Evaluation Patient Details Name: Ann Graham MRN: [id number] DOB: 1955-09-20 Today's Date: 04/17/2021   History of Present Illness Pt is a 66 y/o female presenting to the ED 1/31 following fall off of step stool. Found to have L1 compression fx.   Clinical Impression   Pt is typically very independent in mobility and ADL. Today she is max A for UB dressing (to don/doff TLSO brace), max A +2 for LB ADL that requires sit<>stand. She is mod A +2 for bed mobility and mod A +2 for sit<>stand from elevated ED stretcher. Pain under better control today - reporting 6/10 with premedication. She will require continued OT services both at the acute level and post-acute at the SNF level to maximize safety and independence in ADL and functional transfers.       Recommendations for follow up therapy are one component of a multi-disciplinary discharge planning process, led by the attending physician.  Recommendations may be updated based on patient status, additional functional criteria and insurance authorization.   Follow Up Recommendations  Skilled nursing-short term rehab (<3 hours/day)    Assistance Recommended at Discharge Frequent or constant Supervision/Assistance  Patient can return home with the following Two people to help with walking and/or transfers;A lot of help with bathing/dressing/bathroom;Assistance with cooking/housework;Assist for transportation;Help with stairs or ramp for entrance    Functional Status Assessment  Patient has had a recent decline in their functional status and demonstrates the ability to make significant improvements in function in a reasonable and predictable amount of time.  Equipment Recommendations  BSC/3in1;Toilet rise with handles    Recommendations for Other Services PT consult     Precautions / Restrictions Precautions Precautions: Fall;Back Precaution Booklet Issued: No Precaution Comments: Pt did not recall back precautions and  required review Required Braces or Orthoses: Spinal Brace Spinal Brace: Thoracolumbosacral orthotic Restrictions Weight Bearing Restrictions: No      Mobility Bed Mobility Overal bed mobility: Needs Assistance Bed Mobility: Supine to Sit, Sit to Supine     Supine to sit: Max assist, +2 for physical assistance Sit to supine: Max assist, +2 for physical assistance   General bed mobility comments: Required assist for trunk and LEs. Used helecopter technique to maintain back precautions    Transfers Overall transfer level: Needs assistance Equipment used: 2 person hand held assist Transfers: Sit to/from Stand Sit to Stand: Mod assist, +2 physical assistance           General transfer comment: Mod A +2 for lift assist and steadying to stand.      Balance Overall balance assessment: Needs assistance Sitting-balance support: Bilateral upper extremity supported Sitting balance-Leahy Scale: Poor     Standing balance support: Bilateral upper extremity supported Standing balance-Leahy Scale: Poor Standing balance comment: Reliant on BUE and external support                           ADL either performed or assessed with clinical judgement   ADL Overall ADL's : Needs assistance/impaired Eating/Feeding: Set up;Sitting   Grooming: Wash/dry hands;Wash/dry face;Set up;Sitting Grooming Details (indicate cue type and reason): EOB Upper Body Bathing: Moderate assistance;Sitting   Lower Body Bathing: Maximal assistance;Sit to/from stand   Upper Body Dressing : Maximal assistance;Sitting Upper Body Dressing Details (indicate cue type and reason): to doff/don brace for proper positioning Lower Body Dressing: Maximal assistance;+2 for safety/equipment;Sit to/from stand   Toilet Transfer: Moderate assistance;+2 for physical assistance;+2 for safety/equipment  Toileting- Clothing Manipulation and Hygiene: Maximal assistance;+2 for physical assistance;+2 for  safety/equipment;Sit to/from stand       Functional mobility during ADLs: Moderate assistance;+2 for physical assistance;+2 for safety/equipment (2 person HHA in the ED) General ADL Comments: decreased access to LB for ADL, decreased balance and safety awareness with back precautions     Vision Baseline Vision/History: 1 Wears glasses Ability to See in Adequate Light: 0 Adequate Patient Visual Report: No change from baseline Vision Assessment?: No apparent visual deficits     Perception     Praxis      Pertinent Vitals/Pain Pain Assessment Pain Assessment: 0-10 Pain Score: 6  Pain Location: back Pain Descriptors / Indicators: Grimacing, Guarding, Moaning Pain Intervention(s): Limited activity within patient's tolerance, Monitored during session, Repositioned, Premedicated before session     Hand Dominance Right   Extremity/Trunk Assessment Upper Extremity Assessment Upper Extremity Assessment: Overall WFL for tasks assessed   Lower Extremity Assessment Lower Extremity Assessment: Defer to PT evaluation   Cervical / Trunk Assessment Cervical / Trunk Assessment: Other exceptions Cervical / Trunk Exceptions: L1 compression fx   Communication Communication Communication: No difficulties   Cognition Arousal/Alertness: Awake/alert Behavior During Therapy: WFL for tasks assessed/performed Overall Cognitive Status: Impaired/Different from baseline Area of Impairment: Safety/judgement                         Safety/Judgement: Decreased awareness of safety           General Comments  Pt wants to get rehab prior to return home    Exercises     Shoulder Instructions      Home Living Family/patient expects to be discharged to:: Private residence Living Arrangements: Alone Available Help at Discharge: Family;Available PRN/intermittently (daughter lives 30 min away) Type of Home: House Home Access: Stairs to enter [employer] of Steps:  4-5 Entrance Stairs-Rails: Right;Left Home Layout: One level     Bathroom Shower/Tub: Teacher, early years/pre: Standard     Home Equipment: None          Prior Functioning/Environment Prior Level of Function : Independent/Modified Independent             Mobility Comments: Reports independence with mobility tasks.          OT Problem List: Decreased range of motion;Decreased activity tolerance;Impaired balance (sitting and/or standing);Decreased safety awareness;Decreased knowledge of use of DME or AE;Decreased knowledge of precautions;Obesity;Pain      OT Treatment/Interventions: Self-care/ADL training;DME and/or AE instruction;Therapeutic activities;Patient/family education;Balance training    OT Goals(Current goals can be found in the care plan section) Acute Rehab OT Goals Patient Stated Goal: back to independent OT Goal Formulation: With patient Time For Goal Achievement: 05/01/21 Potential to Achieve Goals: Good ADL Goals Pt Will Perform Grooming: with modified independence;sitting Pt Will Perform Upper Body Dressing: with supervision;sitting Pt Will Perform Lower Body Dressing: with supervision;with adaptive equipment;sit to/from stand Pt Will Transfer to Toilet: with supervision;ambulating Pt Will Perform Toileting - Clothing Manipulation and hygiene: with supervision;with adaptive equipment;sit to/from stand Additional ADL Goal #1: Pt will recall and maintain back precautions throughout morning ADL routine with no cues  OT Frequency: Min 2X/week    Co-evaluation PT/OT/SLP Co-Evaluation/Treatment: Yes Reason for Co-Treatment: For patient/therapist safety;To address functional/ADL transfers PT goals addressed during session: Mobility/safety with mobility;Balance;Strengthening/ROM OT goals addressed during session: ADL's and self-care;Strengthening/ROM      AM-PAC OT "6 Clicks" Daily Activity     Outcome Measure Help from  another person eating  meals?: A Little Help from another person taking care of personal grooming?: A Little Help from another person toileting, which includes using toliet, bedpan, or urinal?: A Lot Help from another person bathing (including washing, rinsing, drying)?: A Lot Help from another person to put on and taking off regular upper body clothing?: A Lot Help from another person to put on and taking off regular lower body clothing?: A Lot 6 Click Score: 14   End of Session Equipment Utilized During Treatment: Back brace Nurse Communication: Mobility status;Other (comment) (needs new purewick)  Activity Tolerance: Patient tolerated treatment well Patient left: with call bell/phone within reach;Other (comment) (supine on ED stretcher)  OT Visit Diagnosis: Unsteadiness on feet (R26.81);Other symptoms and signs involving the nervous system (R29.898);Pain Pain - Right/Left:  (central) Pain - part of body:  (back)                Time: 7096-4383 OT Time Calculation (min): 26 min Charges:  OT General Charges $OT Visit: 1 Visit OT Evaluation $OT Eval Moderate Complexity: [place] OTR/L Acute Rehabilitation Services Pager: [phone] Office: [place] 04/17/2021, 12:37 PM

## 2021-04-17 NOTE — Assessment & Plan Note (Signed)
-  stable, no significant fluid overload

## 2021-04-17 NOTE — Assessment & Plan Note (Signed)
-  replete and monitor

## 2021-04-17 NOTE — Progress Notes (Signed)
Physical Therapy Treatment Patient Details Name: Ann Graham MRN: [id number] DOB: 1955-05-10 Today's Date: 04/17/2021   History of Present Illness Pt is a 66 y/o female presenting to the ED 1/31 following fall off of step stool. Found to have L1 compression fx.    PT Comments    Pt progressing towards goals and able to tolerate increased mobility this session. Continues to require mod to max A +2 for bed mobility and transfers. Reporting pain has improved, but still continues to limit mobility. Current recommendations for SNF appropriate as pt has had significant decline in prior level of function. Will continue to follow acutely.     Recommendations for follow up therapy are one component of a multi-disciplinary discharge planning process, led by the attending physician.  Recommendations may be updated based on patient status, additional functional criteria and insurance authorization.  Follow Up Recommendations  Skilled nursing-short term rehab (<3 hours/day)     Assistance Recommended at Discharge Frequent or constant Supervision/Assistance  Patient can return home with the following Two people to help with walking and/or transfers;Two people to help with bathing/dressing/bathroom;Help with stairs or ramp for entrance;Assist for transportation;Assistance with Education officer, environmental cushion (measurements PT);Wheelchair (measurements PT);Hospital bed    Recommendations for Other Services       Precautions / Restrictions Precautions Precautions: Fall;Back Precaution Booklet Issued: No Precaution Comments: Pt did not recall back precautions and required review Required Braces or Orthoses: Spinal Brace Spinal Brace: Thoracolumbosacral orthotic Restrictions Weight Bearing Restrictions: No     Mobility  Bed Mobility Overal bed mobility: Needs Assistance Bed Mobility: Supine to Sit, Sit to Supine     Supine to sit: Max assist, +2 for  physical assistance Sit to supine: Max assist, +2 for physical assistance   General bed mobility comments: Required assist for trunk and LEs. Used helecopter technique to maintain back precautions    Transfers Overall transfer level: Needs assistance Equipment used: 2 person hand held assist Transfers: Sit to/from Stand Sit to Stand: Mod assist, +2 physical assistance           General transfer comment: Mod A +2 for lift assist and steadying to stand.    Ambulation/Gait Ambulation/Gait assistance: Mod assist, +2 physical assistance   Assistive device: 2 person hand held assist   Gait velocity: Decreased     General Gait Details: took steps to side and forward/back from EOB. Mod A +2 for steadying. Pt with increased pain, so further mobility limited.   Stairs             Wheelchair Mobility    Modified Rankin (Stroke Patients Only)       Balance Overall balance assessment: Needs assistance Sitting-balance support: Bilateral upper extremity supported Sitting balance-Leahy Scale: Poor     Standing balance support: Bilateral upper extremity supported Standing balance-Leahy Scale: Poor Standing balance comment: Reliant on BUE and external support                            Cognition Arousal/Alertness: Awake/alert Behavior During Therapy: WFL for tasks assessed/performed Overall Cognitive Status: Impaired/Different from baseline Area of Impairment: Safety/judgement                         Safety/Judgement: Decreased awareness of safety              Exercises      General Comments  Pertinent Vitals/Pain Pain Assessment Pain Assessment: 0-10 Pain Score: 6  Pain Location: back Pain Descriptors / Indicators: Grimacing, Guarding, Moaning Pain Intervention(s): Limited activity within patient's tolerance, Monitored during session, Repositioned    Home Living                          Prior Function             PT Goals (current goals can now be found in the care plan section) Acute Rehab PT Goals Patient Stated Goal: to be independent before going home PT Goal Formulation: With patient Time For Goal Achievement: 04/30/21 Potential to Achieve Goals: Good Progress towards PT goals: Progressing toward goals    Frequency    Min 3X/week      PT Plan Current plan remains appropriate    Co-evaluation PT/OT/SLP Co-Evaluation/Treatment: Yes Reason for Co-Treatment: For patient/therapist safety;To address functional/ADL transfers PT goals addressed during session: Mobility/safety with mobility;Balance        AM-PAC PT "6 Clicks" Mobility   Outcome Measure  Help needed turning from your back to your side while in a flat bed without using bedrails?: Total Help needed moving from lying on your back to sitting on the side of a flat bed without using bedrails?: Total Help needed moving to and from a bed to a chair (including a wheelchair)?: Total Help needed standing up from a chair using your arms (e.g., wheelchair or bedside chair)?: Total Help needed to walk in hospital room?: Total Help needed climbing 3-5 steps with a railing? : Total 6 Click Score: 6    End of Session Equipment Utilized During Treatment: Back brace Activity Tolerance: Patient tolerated treatment well Patient left: in bed;with call bell/phone within reach (on stretcher in ED) Nurse Communication: Mobility status PT Visit Diagnosis: Unsteadiness on feet (R26.81);Muscle weakness (generalized) (M62.81);Difficulty in walking, not elsewhere classified (R26.2)     Time: 1443-1540 PT Time Calculation (min) (ACUTE ONLY): 27 min  Charges:  $Therapeutic Activity: 8-22 mins                     Lou Miner, DPT  Acute Rehabilitation Services  Pager: [phone] Office: [phone]    Rudean Hitt 04/17/2021, 11:42 AM

## 2021-04-17 NOTE — Plan of Care (Signed)
  Problem: Education: Goal: Knowledge of General Education information will improve Description Including pain rating scale, medication(s)/side effects and non-pharmacologic comfort measures Outcome: Progressing   Problem: Health Behavior/Discharge Planning: Goal: Ability to manage health-related needs will improve Outcome: Progressing   

## 2021-04-17 NOTE — Assessment & Plan Note (Signed)
-  requiring SNF for short-term rehab

## 2021-04-17 NOTE — TOC CAGE-AID Note (Signed)
Transition of Care [facility]) - CAGE-AID Screening   Patient Details  Name: Ann Graham MRN: [id number] Date of Birth: Apr 10, 1955  [facility] CM/SW Contact:    Jamine Wingate C Tarpley-Carter, [place] Phone Number: 04/17/2021, 10:11 AM   Clinical Narrative: Pt participated in [place].  Pt stated she does not use substance or ETOH. Pt stated she drinks ETOH occasionally. Pt was not offered resources, due to no usage of substance or ETOH.    Terrill Wauters Tarpley-Carter, MSW, LCSW-A Pronouns:  She/Her/Hers [place] Transitions of Care Clinical Social Worker Direct Number:  [phone] [email or web address]  CAGE-AID Screening:    Have You Ever Felt You Ought to Cut Down on Your Drinking or Drug Use?: No Have People Annoyed You By [employer] Your Drinking Or Drug Use?: No Have You Felt Bad Or Guilty About Your Drinking Or Drug Use?: No Have You Ever Had a Drink or Used Drugs First Thing In The Morning to Steady Your Nerves or to Get Rid of a Hangover?: No CAGE-AID Score: 0  Substance Abuse Education Offered: No

## 2021-04-17 NOTE — Assessment & Plan Note (Addendum)
-  due to fall.  Neurosurgery recommends conservative management with TLSO brace.  Continue pain control, still requiring IV Dilaudid this morning.  PT recommends SNF, she will be discharged to [facility]

## 2021-04-17 NOTE — Hospital Course (Signed)
66 year old female with HTN, HLD, history of Budd-Chiari syndrome, frequent falls presents to the hospital after another fall.  She was working to change her shower curtain, lost her balance and fell on her back.  She was experiencing excruciating back pain, was unable to ambulate and came to the hospital.  She was found to have comminuted L1 compression fracture with 40% loss of vertebral body height.  Dr. Kathyrn Sheriff with neurosurgery consulted and recommend seeing TLSO brace, PT, outpatient follow-up.

## 2021-04-17 NOTE — Progress Notes (Signed)
PROGRESS NOTE  Ann Graham FOY:[id number] DOB: 10-16-1955 DOA: 04/16/2021 PCP: Kristen Loader, FNP   LOS: 0 days   Brief Narrative / Interim history: 66 year old female with HTN, HLD, history of Budd-Chiari syndrome, frequent falls presents to the hospital after another fall.  She was working to change her shower curtain, lost her balance and fell on her back.  She was experiencing excruciating back pain, was unable to ambulate and came to the hospital.  She was found to have comminuted L1 compression fracture with 40% loss of vertebral body height.  Dr. Kathyrn Sheriff with neurosurgery consulted and recommend seeing TLSO brace, PT, outpatient follow-up.  Subjective / 24h Interval events: She is continue to complain of significant pain to her back.  PT recommends SNF  Assessment & Plan: Principal problem L1 compression fracture-due to fall.  Neurosurgery recommends conservative management with TLSO brace.  Continue pain control, still requiring IV Dilaudid this morning.  PT recommends SNF, placement in process.  Active problems Hypokalemia-replete and monitor  Hypocalcemia-replete and monitor  Obesity, class III -BMI 46, she would benefit from weight loss  Frequent falls-requiring SNF for short-term rehab  Chronic diastolic CHF-stable, no significant fluid overload  Scheduled Meds:  enoxaparin (LOVENOX) injection  60 mg Subcutaneous Q24H   sodium chloride flush  3 mL Intravenous Q12H   Continuous Infusions: PRN Meds:.acetaminophen **OR** acetaminophen, albuterol, HYDROmorphone (DILAUDID) injection, oxyCODONE-acetaminophen  Diet Orders (From admission, onward)     Start     Ordered   04/16/21 1259  Diet regular Room service appropriate? Yes; Fluid consistency: Thin  Diet effective now       Question Answer Comment  Room service appropriate? Yes   Fluid consistency: Thin      04/16/21 1258            DVT prophylaxis:    Lab Results  Component Value Date   PLT 233  04/17/2021      Code Status: Full Code  Family Communication: no family at bedside   Status is: Observation The patient will require care spanning > 2 midnights and should be moved to inpatient because: IV pain medication, placement  Planned Discharge Destination: Skilled nursing facility  Level of care: Telemetry Medical  Consultants:  none  Procedures:  none  Microbiology  none  Antimicrobials: none    Objective: Vitals:   04/17/21 0448 04/17/21 0449 04/17/21 0545 04/17/21 0845  BP: (!) 145/98  (!) 153/68 (!) 141/60  Pulse: 74 72 66 77  Resp: 18 20 20  (!) 23  Temp:      TempSrc:      SpO2: 93% 98% 93% 95%  Weight:      Height:       No intake or output data in the 24 hours ending 04/17/21 1056 Wt Readings from Last 3 Encounters:  04/16/21 117.9 kg  07/16/20 115.5 kg  04/19/13 99.8 kg    Examination:  Constitutional: NAD Eyes: no scleral icterus ENMT: Mucous membranes are moist.  Neck: normal, supple Respiratory: clear to auscultation bilaterally, no wheezing, no crackles. Normal respiratory effort.  Cardiovascular: Regular rate and rhythm, no murmurs / rubs / gallops. No LE edema. Good peripheral pulses Abdomen: non distended, no tenderness. Bowel sounds positive.  Musculoskeletal: no clubbing / cyanosis.  Skin: no rashes Neurologic: non focal    Data Reviewed: I have independently reviewed following labs and imaging studies   CBC Recent Labs  Lab 04/16/21 0248 04/17/21 0416  WBC 9.1 10.3  HGB 12.5 12.1  HCT 38.0 38.2  PLT 261 233  MCV 83.2 84.7  MCH 27.4 26.8  MCHC 32.9 31.7  RDW 14.0 14.4  LYMPHSABS 1.3  --   MONOABS 0.6  --   EOSABS 0.0  --   BASOSABS 0.0  --     Recent Labs  Lab 04/16/21 0248 04/17/21 0416  NA 138 137  K 3.2* 3.4*  CL 101 103  CO2 26 25  GLUCOSE 114* 110*  BUN 18 20  CREATININE 0.74 0.63  CALCIUM 8.8* 8.9  AST 26  --   ALT 19  --   ALKPHOS 79  --   BILITOT 1.1  --   ALBUMIN 3.5  --   MG  --  2.4    ------------------------------------------------------------------------------------------------------------------ No results for input(s): CHOL, HDL, LDLCALC, TRIG, CHOLHDL, LDLDIRECT in the last 72 hours.  Lab Results  Component Value Date   HGBA1C 5.7 (H) 06/13/2012   ------------------------------------------------------------------------------------------------------------------ No results for input(s): TSH, T4TOTAL, T3FREE, THYROIDAB in the last 72 hours.  Invalid input(s): FREET3  Cardiac Enzymes No results for input(s): CKMB, TROPONINI, MYOGLOBIN in the last 168 hours.  Invalid input(s): CK ------------------------------------------------------------------------------------------------------------------    Component Value Date/Time   BNP 43.3 06/23/2020 0952    CBG: No results for input(s): GLUCAP in the last 168 hours.  Recent Results (from the past 240 hour(s))  Resp Panel by RT-PCR (Flu A&B, Covid) Nasopharyngeal Swab     Status: None   Collection Time: 04/16/21  1:55 PM   Specimen: Nasopharyngeal Swab; Nasopharyngeal(NP) swabs in vial transport medium  Result Value Ref Range Status   SARS Coronavirus 2 by RT PCR NEGATIVE NEGATIVE Final    Comment: (NOTE) SARS-CoV-2 target nucleic acids are NOT DETECTED.  The SARS-CoV-2 RNA is generally detectable in upper respiratory specimens during the acute phase of infection. The lowest concentration of SARS-CoV-2 viral copies this assay can detect is 138 copies/mL. A negative result does not preclude SARS-Cov-2 infection and should not be used as the sole basis for treatment or other patient management decisions. A negative result may occur with  improper specimen collection/handling, submission of specimen other than nasopharyngeal swab, presence of viral mutation(s) within the areas targeted by this assay, and inadequate number of viral copies(<138 copies/mL). A negative result must be combined with clinical  observations, patient history, and epidemiological information. The expected result is Negative.  Fact Sheet for Patients:  EntrepreneurPulse.com.au  Fact Sheet for Healthcare Providers:  IncredibleEmployment.be  This test is no t yet approved or cleared by the [place] FDA and  has been authorized for detection and/or diagnosis of SARS-CoV-2 by FDA under an Emergency Use Authorization (EUA). This EUA will remain  in effect (meaning this test can be used) for the duration of the COVID-19 declaration under Section 564(b)(1) of the Act, 21 U.S.C.section 360bbb-3(b)(1), unless the authorization is terminated  or revoked sooner.       Influenza A by PCR NEGATIVE NEGATIVE Final   Influenza B by PCR NEGATIVE NEGATIVE Final    Comment: (NOTE) The Xpert Xpress SARS-CoV-2/FLU/RSV plus assay is intended as an aid in the diagnosis of influenza from Nasopharyngeal swab specimens and should not be used as a sole basis for treatment. Nasal washings and aspirates are unacceptable for Xpert Xpress SARS-CoV-2/FLU/RSV testing.  Fact Sheet for Patients: EntrepreneurPulse.com.au  Fact Sheet for Healthcare Providers: IncredibleEmployment.be  This test is not yet approved or cleared by the [place] FDA and has been authorized for detection and/or diagnosis of SARS-CoV-2 by  FDA under an Emergency Use Authorization (EUA). This EUA will remain in effect (meaning this test can be used) for the duration of the COVID-19 declaration under Section 564(b)(1) of the Act, 21 U.S.C. section 360bbb-3(b)(1), unless the authorization is terminated or revoked.  Performed at [facility], [place] [address] [place], [place] [address]      Radiology Studies: CT Head Wo Contrast  Result Date: 04/16/2021 CLINICAL DATA:  [place] EXAM: CT HEAD WITHOUT CONTRAST TECHNIQUE: Contiguous axial images were obtained from the base of the  skull through the vertex without intravenous contrast. RADIATION DOSE REDUCTION: This exam was performed according to the departmental dose-optimization program which includes automated exposure control, adjustment of the mA and/or kV according to patient size and/or use of iterative reconstruction technique. COMPARISON:  08/21/2018 FINDINGS: Brain: No evidence of acute infarction, hemorrhage, extra-axial collection or mass lesion/mass effect. Stable borderline ventriculomegaly. Vascular: No hyperdense vessel or unexpected calcification. Skull: Normal. Negative for fracture or focal lesion. Sinuses/Orbits: No acute finding. Other: None. IMPRESSION: No acute findings Electronically Signed   By: Lucrezia Europe M.D.   On: 04/16/2021 15:06     Marzetta Board, MD, PhD [facility]  Between 7 am - 7 pm I am available, please contact me via Amion (for emergencies) or Securechat (non urgent messages)  Between 7 pm - 7 am I am not available, please contact night coverage MD/APP via Amion

## 2021-04-18 DIAGNOSIS — I5032 Chronic diastolic (congestive) heart failure: Secondary | ICD-10-CM

## 2021-04-18 DIAGNOSIS — G4733 Obstructive sleep apnea (adult) (pediatric): Secondary | ICD-10-CM

## 2021-04-18 DIAGNOSIS — N390 Urinary tract infection, site not specified: Secondary | ICD-10-CM

## 2021-04-18 LAB — RESP PANEL BY RT-PCR (FLU A&B, COVID) ARPGX2
Influenza A by PCR: NEGATIVE
Influenza B by PCR: NEGATIVE
SARS Coronavirus 2 by RT PCR: NEGATIVE

## 2021-04-18 MED ORDER — OXYCODONE-ACETAMINOPHEN 5-325 MG PO TABS: 1.0000 | ORAL_TABLET | Freq: Four times a day (QID) | ORAL | 0 refills | Status: DC | PRN

## 2021-04-18 MED ORDER — CEPHALEXIN 500 MG PO CAPS: 500.0000 mg | ORAL_CAPSULE | Freq: Two times a day (BID) | ORAL | 0 refills | Status: AC

## 2021-04-18 NOTE — Progress Notes (Signed)
Report given to [facility] RN at [employer].

## 2021-04-18 NOTE — Assessment & Plan Note (Signed)
-   Urinalysis with many bacteria, positive nitrates and leukocytes.  She has dysuria.  Treat with few days of Keflex.

## 2021-04-18 NOTE — Discharge Summary (Signed)
Physician Discharge Summary  Ann Graham HYW:[id number] DOB: 08/23/1955 DOA: 04/16/2021  PCP: Kristen Loader, FNP  Admit date: 04/16/2021 Discharge date: 04/18/2021  Admitted From: home Disposition:  SNF  Recommendations for Outpatient Follow-up:  Follow up with PCP in 1-2 weeks  Home Health: none Equipment/Devices: none  Discharge Condition: stable CODE STATUS: Full code Diet recommendation: regular  HPI: Per admitting MD, Ann Graham is a 66 y.o. female with medical history significant of hypertension, hyperlipidemia, budd chiari syndrome, and frequent falls presents after having a fall at home.  She had been in the process of trying to change her shower curtain yesterday when she fell landing on her back.  Denies any loss of consciousness, but  complained of having severe pain in her lower lower back.  She was able to crawl and call for help.  Denies having any radiation of the pain in her back.  She lives at home alone and reports that she has a long history of frequent falls even as a kid.  States that she does not know if her equilibrium is off or she is just clumsy.  Denies any recent fever, chills, chest pain, nausea, vomiting, or diarrhea.  Hospital Course / Discharge diagnoses: Assessment and Plan: Compression fracture of L1 lumbar vertebra ([place])- (present on admission) -due to fall.  Neurosurgery recommends conservative management with TLSO brace.  Continue pain control, still requiring IV Dilaudid this morning.  PT recommends SNF, she will be discharged to [facility]  Hypocalcemia- (present on admission) -replete and monitor  Hypokalemia- (present on admission) -replete and monitor  OSA (obstructive sleep apnea) - Patient was diagnosed in the past with OSA, but no longer using CPAP.  She will need a repeat study.  She used CPAP while in the ED with excellent results and felt a whole lot better.  Recommend to continue using CPAP while at the SNF and get sleep study as  soon as possible following discharge from SNF  UTI (urinary tract infection) - Urinalysis with many bacteria, positive nitrates and leukocytes.  She has dysuria.  Treat with few days of Keflex.  Chronic diastolic CHF (congestive heart failure) (HCC) -stable, no significant fluid overload  Obesity, Class III, BMI 40-49.9 (morbid obesity) ([place])- (present on admission) -BMI 46, she would benefit from weight loss  Frequent falls -requiring SNF for short-term rehab   Sepsis ruled out   Discharge Instructions   Allergies as of 04/18/2021       Reactions   Hydrocodone Hives   Naproxen Sodium Swelling   Lisinopril Other (See Comments)   Congestion, fluid build up        Medication List     STOP taking these medications    aspirin 81 MG EC tablet   atorvastatin 20 MG tablet Commonly known as: LIPITOR   fluticasone 50 MCG/ACT nasal spray Commonly known as: FLONASE   promethazine-dextromethorphan 6.25-15 MG/5ML syrup Commonly known as: PROMETHAZINE-DM   Salonpas Pain Relief Patch Ptch   traMADol 50 MG tablet Commonly known as: ULTRAM   traZODone 50 MG tablet Commonly known as: DESYREL       TAKE these medications    albuterol 108 (90 Base) MCG/ACT inhaler Commonly known as: VENTOLIN HFA Inhale 2 puffs into the lungs every 6 (six) hours as needed for wheezing or shortness of breath.   budesonide-formoterol 160-4.5 MCG/ACT inhaler Commonly known as: Symbicort Inhale 2 puffs into the lungs in the morning and at bedtime.   cephALEXin 500  MG capsule Commonly known as: KEFLEX Take 1 capsule (500 mg total) by mouth 2 (two) times daily for 5 days.   cetirizine 10 MG tablet Commonly known as: ZyrTEC Allergy Take 1 tablet (10 mg total) by mouth daily.   diclofenac 75 MG EC tablet Commonly known as: VOLTAREN Take 75 mg by mouth 2 (two) times daily.   furosemide 20 MG tablet Commonly known as: LASIX Take 20 mg by mouth daily.   oxyCODONE-acetaminophen 5-325  MG tablet Commonly known as: PERCOCET/ROXICET Take 1 tablet by mouth every 6 (six) hours as needed for moderate pain.   PEPCID PO Take 20 mg by mouth as needed (heartburn).   Potassium Chloride ER 20 MEQ Tbcr Take 1 tablet by mouth daily.   rosuvastatin 10 MG tablet Commonly known as: CRESTOR Take 10 mg by mouth daily.        Follow-up Information     [facility] Follow up.   Why: Agency will call you to set up initial visit. Contact information: [place] [address] [phone]                 Consultations: none  Procedures/Studies:  CT Head Wo Contrast  Result Date: 04/16/2021 CLINICAL DATA:  [place] EXAM: CT HEAD WITHOUT CONTRAST TECHNIQUE: Contiguous axial images were obtained from the base of the skull through the vertex without intravenous contrast. RADIATION DOSE REDUCTION: This exam was performed according to the departmental dose-optimization program which includes automated exposure control, adjustment of the mA and/or kV according to patient size and/or use of iterative reconstruction technique. COMPARISON:  08/21/2018 FINDINGS: Brain: No evidence of acute infarction, hemorrhage, extra-axial collection or mass lesion/mass effect. Stable borderline ventriculomegaly. Vascular: No hyperdense vessel or unexpected calcification. Skull: Normal. Negative for fracture or focal lesion. Sinuses/Orbits: No acute finding. Other: None. IMPRESSION: No acute findings Electronically Signed   By: Lucrezia Europe M.D.   On: 04/16/2021 15:06   CT ABDOMEN PELVIS W CONTRAST  Result Date: 04/16/2021 CLINICAL DATA:  66 year old female status post fall with injury. EXAM: CT ABDOMEN AND PELVIS WITH CONTRAST TECHNIQUE: Multidetector CT imaging of the abdomen and pelvis was performed using the standard protocol following bolus administration of intravenous contrast. RADIATION DOSE REDUCTION: This exam was performed according to the departmental  dose-optimization program which includes automated exposure control, adjustment of the mA and/or kV according to patient size and/or use of iterative reconstruction technique. CONTRAST:  128mL OMNIPAQUE IOHEXOL 300 MG/ML  SOLN COMPARISON:  CT Abdomen and Pelvis 04/12/2013. FINDINGS: Lower chest: Stable since 2015 and negative. Hepatobiliary: Negative liver and gallbladder. Pancreas: Negative. Spleen: Negative. Adrenals/Urinary Tract: Normal adrenal glands. Largely stable kidneys since 2015. Several tiny cortical cysts. There are several areas of chronic but new left renal cortical scarring identified. But symmetric renal enhancement and contrast excretion. No nephrolithiasis, hydronephrosis or hydroureter. Unremarkable bladder. Occasional chronic pelvic phleboliths. Stomach/Bowel: Redundant but otherwise negative large bowel. The cecum is on a lax mesentery located in the anterior right abdomen. Normal appendix visible on coronal image 44. Decompressed terminal ileum. No dilated small bowel. Decompressed stomach and duodenum. No free air, free fluid, or mesenteric inflammation identified. Vascular/Lymphatic: Aortoiliac calcified atherosclerosis. Normal caliber abdominal aorta. Major arterial structures are patent. No lymphadenopathy identified. Reproductive: Negative. Other: No pelvic free fluid. Musculoskeletal: Lumbar spine today reported separately. No other acute osseous abnormality identified. IMPRESSION: 1. Abnormal lumbar spine is reported separately. 2. No other acute traumatic injury identified in the abdomen or pelvis. Aortic Atherosclerosis (  ICD10-I70.0). Electronically Signed   By: Genevie Ann M.D.   On: 04/16/2021 04:26   CT L-SPINE NO CHARGE  Result Date: 04/16/2021 CLINICAL DATA:  66 year old female status post fall with low back injury. EXAM: CT LUMBAR SPINE WITH CONTRAST TECHNIQUE: Technique: Multiplanar CT images of the lumbar spine were reconstructed from contemporary CT of the Abdomen and Pelvis.  RADIATION DOSE REDUCTION: This exam was performed according to the departmental dose-optimization program which includes automated exposure control, adjustment of the mA and/or kV according to patient size and/or use of iterative reconstruction technique. CONTRAST:  No additional COMPARISON:  CT Abdomen and Pelvis today are reported separately. CT Abdomen and Pelvis 04/12/2013. FINDINGS: Segmentation: Normal, with hypoplastic ribs at T12. Alignment: New grade 1 anterolisthesis of L4 on L5 since 2015. Otherwise preserved lumbar lordosis. Vertebrae: Comminuted compression fracture of the L1 body. 44% loss of vertebral body height. Mild anterior displacement of endplate and anterior vertebral body comminution fragments, but only minimal retropulsion. L1 pedicles and posterior elements remain intact. No L1 level spinal or foraminal stenosis. Visible lower thoracic levels appears stable and intact. L2 through L5 appear intact. Visible sacrum and SI joints appear intact. Paraspinal and other soft tissues: Faint ventral paraspinal soft tissue stranding or contusion. Otherwise negative lumbar paraspinal soft tissues. Abdominal and pelvic viscera are reported separately. Disc levels: Progressed lower lumbar facet arthropathy since 2015 with grade 1 anterolisthesis of L4 on L5. Circumferential disc/pseudo disc bulge at that level but otherwise stable disc spaces. Severe L4-L5 facet hypertrophy with vacuum facet. Mild if any associated spinal stenosis. Chronic disc space loss and vacuum disc at L5-S1 with circumferential disc osteophyte complex. No significant spinal stenosis. Mild to moderate chronic L5 foraminal stenosis has not significantly changed. IMPRESSION: 1. Comminuted L1 compression fracture with 44% loss of vertebral body height. Minimal retropulsion with no associated spinal stenosis or other complicating features. 2. No other acute traumatic injury identified in the lumbar spine. 3. Progressed lumbar facet  arthropathy since 2015 with new grade 1 anterolisthesis of L4 on L5. But mild if any associated degenerative lumbar spinal stenosis. Stable chronic L5 foraminal stenosis. 4. CT Abdomen and Pelvis today reported separately. Electronically Signed   By: Genevie Ann M.D.   On: 04/16/2021 04:32     Subjective: - no chest pain, shortness of breath, no abdominal pain, nausea or vomiting.   Discharge Exam: BP (!) 161/76 (BP Location: Left Arm)    Pulse 67    Temp 98.2 F (36.8 C)    Resp 17    Ht 5\' 3"  (1.6 m)    Wt 117.9 kg    SpO2 96%    BMI 46.06 kg/m   General: Pt is alert, awake, not in acute distress Cardiovascular: RRR, S1/S2 +, no rubs, no gallops Respiratory: CTA bilaterally, no wheezing, no rhonchi Abdominal: Soft, NT, ND, bowel sounds + Extremities: no edema, no cyanosis   The results of significant diagnostics from this hospitalization (including imaging, microbiology, ancillary and laboratory) are listed below for reference.     Microbiology: Recent Results (from the past 240 hour(s))  Resp Panel by RT-PCR (Flu A&B, Covid) Nasopharyngeal Swab     Status: None   Collection Time: 04/16/21  1:55 PM   Specimen: Nasopharyngeal Swab; Nasopharyngeal(NP) swabs in vial transport medium  Result Value Ref Range Status   SARS Coronavirus 2 by RT PCR NEGATIVE NEGATIVE Final    Comment: (NOTE) SARS-CoV-2 target nucleic acids are NOT DETECTED.  The SARS-CoV-2 RNA is generally  detectable in upper respiratory specimens during the acute phase of infection. The lowest concentration of SARS-CoV-2 viral copies this assay can detect is 138 copies/mL. A negative result does not preclude SARS-Cov-2 infection and should not be used as the sole basis for treatment or other patient management decisions. A negative result may occur with  improper specimen collection/handling, submission of specimen other than nasopharyngeal swab, presence of viral mutation(s) within the areas targeted by this assay, and  inadequate number of viral copies(<138 copies/mL). A negative result must be combined with clinical observations, patient history, and epidemiological information. The expected result is Negative.  Fact Sheet for Patients:  EntrepreneurPulse.com.au  Fact Sheet for Healthcare Providers:  IncredibleEmployment.be  This test is no t yet approved or cleared by the [place] FDA and  has been authorized for detection and/or diagnosis of SARS-CoV-2 by FDA under an Emergency Use Authorization (EUA). This EUA will remain  in effect (meaning this test can be used) for the duration of the COVID-19 declaration under Section 564(b)(1) of the Act, 21 U.S.C.section 360bbb-3(b)(1), unless the authorization is terminated  or revoked sooner.       Influenza A by PCR NEGATIVE NEGATIVE Final   Influenza B by PCR NEGATIVE NEGATIVE Final    Comment: (NOTE) The Xpert Xpress SARS-CoV-2/FLU/RSV plus assay is intended as an aid in the diagnosis of influenza from Nasopharyngeal swab specimens and should not be used as a sole basis for treatment. Nasal washings and aspirates are unacceptable for Xpert Xpress SARS-CoV-2/FLU/RSV testing.  Fact Sheet for Patients: EntrepreneurPulse.com.au  Fact Sheet for Healthcare Providers: IncredibleEmployment.be  This test is not yet approved or cleared by the [place] FDA and has been authorized for detection and/or diagnosis of SARS-CoV-2 by FDA under an Emergency Use Authorization (EUA). This EUA will remain in effect (meaning this test can be used) for the duration of the COVID-19 declaration under Section 564(b)(1) of the Act, 21 U.S.C. section 360bbb-3(b)(1), unless the authorization is terminated or revoked.  Performed at [facility], [place] [address] [place], [place] [address]      Labs: Basic Metabolic Panel: Recent Labs  Lab 04/16/21 0248 04/17/21 0416  NA 138  137  K 3.2* 3.4*  CL 101 103  CO2 26 25  GLUCOSE 114* 110*  BUN 18 20  CREATININE 0.74 0.63  CALCIUM 8.8* 8.9  MG  --  2.4   Liver Function Tests: Recent Labs  Lab 04/16/21 0248  AST 26  ALT 19  ALKPHOS 79  BILITOT 1.1  PROT 7.0  ALBUMIN 3.5   CBC: Recent Labs  Lab 04/16/21 0248 04/17/21 0416  WBC 9.1 10.3  NEUTROABS 7.2  --   HGB 12.5 12.1  HCT 38.0 38.2  MCV 83.2 84.7  PLT 261 233   CBG: No results for input(s): GLUCAP in the last 168 hours. Hgb A1c No results for input(s): HGBA1C in the last 72 hours. Lipid Profile No results for input(s): CHOL, HDL, LDLCALC, TRIG, CHOLHDL, LDLDIRECT in the last 72 hours. Thyroid function studies No results for input(s): TSH, T4TOTAL, T3FREE, THYROIDAB in the last 72 hours.  Invalid input(s): FREET3 Urinalysis    Component Value Date/Time   COLORURINE YELLOW 04/16/2021 1618   APPEARANCEUR HAZY (A) 04/16/2021 1618   LABSPEC <1.005 (L) 04/16/2021 1618   PHURINE 8.5 (H) 04/16/2021 1618   GLUCOSEU NEGATIVE 04/16/2021 1618   HGBUR NEGATIVE 04/16/2021 1618   BILIRUBINUR NEGATIVE 04/16/2021 1618   KETONESUR 15 (A) 04/16/2021 1618  PROTEINUR 100 (A) 04/16/2021 1618   NITRITE POSITIVE (A) 04/16/2021 1618   LEUKOCYTESUR SMALL (A) 04/16/2021 1618    FURTHER DISCHARGE INSTRUCTIONS:   Get Medicines reviewed and adjusted: Please take all your medications with you for your next visit with your Primary MD   Laboratory/radiological data: Please request your Primary MD to go over all hospital tests and procedure/radiological results at the follow up, please ask your Primary MD to get all Hospital records sent to his/her office.   In some cases, they will be blood work, cultures and biopsy results pending at the time of your discharge. Please request that your primary care M.D. goes through all the records of your hospital data and follows up on these results.   Also Note the following: If you experience worsening of your  admission symptoms, develop shortness of breath, life threatening emergency, suicidal or homicidal thoughts you must seek medical attention immediately by calling 911 or calling your MD immediately  if symptoms less severe.   You must read complete instructions/literature along with all the possible adverse reactions/side effects for all the Medicines you take and that have been prescribed to you. Take any new Medicines after you have completely understood and accpet all the possible adverse reactions/side effects.    Do not drive when taking Pain medications or sleeping medications (Benzodaizepines)   Do not take more than prescribed Pain, Sleep and Anxiety Medications. It is not advisable to combine anxiety,sleep and pain medications without talking with your primary care practitioner   Special Instructions: If you have smoked or chewed Tobacco  in the last 2 yrs please stop smoking, stop any regular Alcohol  and or any Recreational drug use.   Wear Seat belts while driving.   Please note: You were cared for by a hospitalist during your hospital stay. Once you are discharged, your primary care physician will handle any further medical issues. Please note that NO REFILLS for any discharge medications will be authorized once you are discharged, as it is imperative that you return to your primary care physician (or establish a relationship with a primary care physician if you do not have one) for your post hospital discharge needs so that they can reassess your need for medications and monitor your lab values.  Time coordinating discharge: 40 minutes  SIGNED:  Marzetta Board, MD, PhD 04/18/2021, 11:43 AM

## 2021-04-18 NOTE — TOC Progression Note (Signed)
Transition of Care [facility]) - Progression Note    Patient Details  Name: Ann Graham MRN: [id number] Date of Birth: [birth year]  Transition of Care [facility]) CM/SW Contact  Joanne Chars, [place] Phone Number: 04/18/2021, 12:53 PM  Clinical Narrative:  Pt has bed offer from [facility], [place] spoke with pt and with daughters [place] and [place], who were in the room and they agree to accept this offer.  [facility] at [facility] notifed.  Pt has negative covid from 1/31 but [facility] needs a new one.  MD, RN notified.     Expected Discharge Plan: Skilled Nursing Facility Barriers to Discharge: SNF Pending bed offer  Expected Discharge Plan and Services Expected Discharge Plan: [place] Choice: [place] arrangements for the past 2 months: Single Family Home Expected Discharge Date: 04/18/21                                     Social Determinants of Health (SDOH) Interventions    Readmission Risk Interventions No flowsheet data found.

## 2021-04-18 NOTE — TOC Transition Note (Addendum)
Transition of Care [facility]) - CM/SW Discharge Note   Patient Details  Name: Ann Graham MRN: [id number] Date of Birth: Sep 11, 1955  Transition of Care [facility]) CM/SW Contact:  Joanne Chars, LCSW Phone Number: 04/18/2021, 12:59 PM   Clinical Narrative:   Pt discharging to [facility].  RN call report to [phone].  PTAR not yet called pending rapid covid result.   1315: covid is negative, PTAR called.     Final next level of care: Skilled Nursing Facility Barriers to Discharge: Barriers Resolved   Patient Goals and CMS Choice Patient states their goals for this hospitalization and ongoing recovery are:: Go home safely CMS Medicare.gov Compare Post Acute Care list provided to:: Patient Choice offered to / list presented to : Patient  Discharge Placement              Patient chooses bed at: [facility] Patient to be transferred to facility by: [place] Name of family member notified: daughters Jeani Hawking and Lenna Sciara in room Patient and family notified of of transfer: 04/18/21  Discharge Plan and Services     Post Acute Care Choice: [place]                               Social Determinants of Health (SDOH) Interventions     Readmission Risk Interventions No flowsheet data found.

## 2021-04-18 NOTE — Assessment & Plan Note (Signed)
-   Patient was diagnosed in the past with OSA, but no longer using CPAP.  She will need a repeat study.  She used CPAP while in the ED with excellent results and felt a whole lot better.  Recommend to continue using CPAP while at the SNF and get sleep study as soon as possible following discharge from SNF

## 2021-04-22 ENCOUNTER — Other Ambulatory Visit: Payer: Self-pay | Admitting: *Deleted

## 2021-04-22 NOTE — Patient Outreach (Signed)
Per [place] eligible member resides in [facility] SN[facility].  Screened for potential [facility] Care Management services as a benefit of member's insurance plan.  Ms. Curl admitted to SNF on 04/18/21 after hospitalization.  Update received from [place], [place] SW indicating transition plan is to return home with daughter's support.  Will continue to collaborate with facility SW and follow up with resident as appropriate.  Will continue to follow for potential [facility] needs and transition plans while member resides in SNF.    Marthenia Rolling, MSN, RN,BSN [place] Acute Care Coordinator [phone] [facility]) [phone]  (Toll free office)

## 2021-05-06 ENCOUNTER — Other Ambulatory Visit: Payer: Self-pay | Admitting: *Deleted

## 2021-05-06 DIAGNOSIS — I5032 Chronic diastolic (congestive) heart failure: Secondary | ICD-10-CM

## 2021-05-06 NOTE — Patient Outreach (Signed)
[facility]) Care Management  05/06/2021  Ann Graham [birth year] [id number]   Received hospital referral from Marthenia Rolling, RN for [place] (central) for care coordination. Assigned to Joellyn Quails, RN for follow up.  [place] Management Assistant [phone]

## 2021-05-06 NOTE — Patient Outreach (Signed)
[facility] Post- Acute Care Coordinator follow up. Per [place] eligible member resides in  [facility].  Screened for potential [facility] Care Management services as a benefit of member's insurance plan.  Facility site visit to [facility] skilled nursing facility. Met with Bryson Ha, SW concerning member's transition plan. Bryson Ha reports Mrs. Mohammad will transition home tomorrow 05/07/21 with [facility].  Went to bedside to speak with Mrs. Westergren. However, she was in a care plan meeting.   Telephone call made to Mrs. Gattuso at [phone] later on to discuss [place] Management follow up. Mrs. Vrba reports her daughter and son in law and 2 year old grandson are going to move in with her. Therefore, she will not be alone. Explained [place] Management follow up. Mrs. Pollok is agreeable. Discussed that [place] Management will not interfere or replace services provided by home health.   Confirmed best contact number for Mrs. Barcelo is [phone].  Will email Mrs. [place] Management brochure with contact information.   Will make referral to [place] for care coordination.   Mrs. Graefe has medical history of HTN, HDL, CHF, compression fracture, budd chiari syndrome.  Mrs. [place] will transition home on 05/07/21 from [facility].    Marthenia Rolling, MSN, RN,BSN [place] Acute Care Coordinator [phone] [facility]) [phone]  (Toll free office)

## 2021-05-07 ENCOUNTER — Other Ambulatory Visit: Payer: Self-pay | Admitting: *Deleted

## 2021-05-07 NOTE — Patient Outreach (Signed)
[facility]) Care Management Telephonic RN Care Manager Note   05/07/2021 Name:  Ann Graham MRN:  [id number] DOB:  1955/05/08   [facility] Unsuccessful outreach Outreach attempt to the listed at the preferred outreach number in [place] No answer. [facility] RN CM left HIPAA [facility] Portability and Accountability Act) compliant voicemail message along with CMs contact info.    Subjective: Ann Graham is an 66 y.o. year old female who is a primary patient of Ann Loader, FNP. The care management team was consulted for assistance with care management and/or care coordination needs.    Telephonic RN Care Manager completed Telephone Visit today.   Objective:  Medications Reviewed Today     Reviewed by Marvell Fuller, CPhT (Pharmacy Technician) on 04/16/21 at [place] List Status: Complete   Medication Order Taking? Sig Documenting Provider Last Dose Status Informant  albuterol (VENTOLIN HFA) 108 (90 Base) MCG/ACT inhaler [id number] Yes Inhale 2 puffs into the lungs every 6 (six) hours as needed for wheezing or shortness of breath. Hunsucker, Bonna Gains, MD Past Week Active Self  aspirin EC 81 MG EC tablet [id number] No Take 1 tablet (81 mg total) by mouth daily.  Patient not taking: Reported on 04/16/2021   Oswald Hillock, MD Not Taking Active Self  atorvastatin (LIPITOR) 20 MG tablet [id number] No Take 20 mg by mouth daily.  Patient not taking: Reported on 04/16/2021   [provider] Not Taking Active Self  budesonide-formoterol (SYMBICORT) 160-4.5 MCG/ACT inhaler [id number] No Inhale 2 puffs into the lungs in the morning and at bedtime.  Patient not taking: Reported on 04/16/2021   Hunsucker, Bonna Gains, MD Not Taking Active Self  cetirizine (ZYRTEC ALLERGY) 10 MG tablet [id number] Yes Take 1 tablet (10 mg total) by mouth daily. Alfredia Client, PA-C 04/15/2021 Active Self  diclofenac (VOLTAREN) 75 MG EC tablet [id number] Yes Take 75 mg by mouth 2  (two) times daily. [provider] Past Month Active Self  Famotidine (PEPCID PO) [id number] Yes Take 20 mg by mouth as needed (heartburn). [provider] 04/15/2021 Active Self  fluticasone (FLONASE) 50 MCG/ACT nasal spray [id number] No Place 2 sprays into both nostrils daily.  Patient not taking: Reported on 04/16/2021   Alfredia Client, PA-C Not Taking Active   furosemide (LASIX) 20 MG tablet [id number] Yes Take 20 mg by mouth daily. Oswald Hillock, MD Past Week Active Self           Med Note Tory Emerald Apr 16, 2021  4:27 PM)    Menthol-Methyl Salicylate [facility] PAIN RELIEF PATCH) [facility] [id number] Yes Apply 1 patch topically as needed (Pain Relief). [provider] 04/15/2021 Active Self  Potassium Chloride ER 20 MEQ TBCR [id number] Yes Take 1 tablet by mouth daily. [provider] Past Week Active Self  promethazine-dextromethorphan (PROMETHAZINE-DM) 6.25-15 MG/5ML syrup [id number] Yes Take 10 mLs by mouth every 6 (six) hours as needed. [provider] Past Month Active Self  rosuvastatin (CRESTOR) 10 MG tablet [id number] Yes Take 10 mg by mouth daily. [provider] Past Week Active Self  traMADol (ULTRAM) 50 MG tablet [id number] No Take 1 tablet (50 mg total) by mouth every 6 (six) hours as needed.  Patient not taking: Reported on 04/16/2021   Drenda Freeze, MD Not Taking Active Self  traZODone (DESYREL) 50 MG tablet [id number] No Take 50 mg by mouth at bedtime.  Patient not taking: Reported on  04/16/2021   [provider] Not Taking Active Self             SDOH:  (Social Determinants of Health) assessments and interventions performed:    Care Plan  Review of patient past medical history, allergies, medications, health status, including review of consultants reports, laboratory and other test data, was performed as part of comprehensive evaluation for care management services.   There are no care plans that you  recently modified to display for this patient.    Plan: The patient has been provided with contact information for the care management team and has been advised to call with any health related questions or concerns.  The care management team will reach out to the patient again over the next 30+ business  days. Unsuccessful outreach 05/07/21 Mailed [facility] unsuccessful outreach letter 05/07/21  Joelene Millin L. Lavina Hamman, RN, BSN, [place] Coordinator Office number [phone] Main [facility] number [phone] Fax number [phone]

## 2021-05-10 ENCOUNTER — Other Ambulatory Visit: Payer: Self-pay | Admitting: *Deleted

## 2021-05-10 ENCOUNTER — Encounter: Payer: Self-pay | Admitting: *Deleted

## 2021-05-10 DIAGNOSIS — F3340 Major depressive disorder, recurrent, in remission, unspecified: Secondary | ICD-10-CM | POA: Insufficient documentation

## 2021-05-10 DIAGNOSIS — F411 Generalized anxiety disorder: Secondary | ICD-10-CM | POA: Insufficient documentation

## 2021-05-10 DIAGNOSIS — Z86010 Personal history of colonic polyps: Secondary | ICD-10-CM | POA: Insufficient documentation

## 2021-05-10 DIAGNOSIS — J309 Allergic rhinitis, unspecified: Secondary | ICD-10-CM | POA: Insufficient documentation

## 2021-05-10 DIAGNOSIS — Z800 Family history of malignant neoplasm of digestive organs: Secondary | ICD-10-CM | POA: Insufficient documentation

## 2021-05-10 DIAGNOSIS — R609 Edema, unspecified: Secondary | ICD-10-CM | POA: Insufficient documentation

## 2021-05-10 DIAGNOSIS — R32 Unspecified urinary incontinence: Secondary | ICD-10-CM | POA: Insufficient documentation

## 2021-05-10 DIAGNOSIS — Z8719 Personal history of other diseases of the digestive system: Secondary | ICD-10-CM | POA: Insufficient documentation

## 2021-05-10 NOTE — Patient Outreach (Addendum)
[facility]) Care Management  Note   05/10/2021 Name:  Ann Graham MRN:  [id number] DOB:  [birth year]  Summary: Pt returned a call to RN CM   Initial assessment/Transition of care (snf)  She has purchased and is taking all medications  [facility] PT & OT visits are active [facility] PT has encouraged de cluttering of the home   Support from her Daughter, son in law and grand son who have moved in with her from [place] and from her brother (errands) Daughter in [place] with limited support  Ambulating with her walker and "eggshell" (TLSO)  F/u Tuesday 05/14/21 with pcp, A Dayna Ramus Sees orthopedic provider in 2 weeks   Sleeping well go to bed earlier & Gets up early to have her coffee  Denies breathing concerns Some left leg pain but manageable  Needs for care coordination and disease management denied today  Recommendations/Changes made from today's visit: Assessed for worsening symptoms Transition of care/initial assessment RN CM discussed the importance of home de cluttering Discussed [facility] services Transition of care from snf and progression, outreaches  Subjective: Ann Graham is an 66 y.o. year old female who is a primary patient of Kristen Loader, FNP. The care management team was consulted for assistance with care management and/or care coordination needs.  She was recently admitted to the hospital 1/31-04/18/21 ( Closed compression fracture of body of L1 vertebra) and then went to [facility]  skilled nursing facility (snf) She was discharged from snf on 05/08/21 to home   Telephonic RN Care Manager completed Telephone Visit today.   Objective:  Medications Reviewed Today     Reviewed by Barbaraann Faster, RN (Registered Nurse) on 05/10/21 at 1452  Med List Status: <None>   Medication Order Taking? Sig Documenting Provider Last Dose Status Informant  acetaZOLAMIDE (DIAMOX) 250 MG tablet [id number]  Take by mouth. [provider]  Active    albuterol (VENTOLIN HFA) 108 (90 Base) MCG/ACT inhaler [id number]  Inhale 2 puffs into the lungs every 6 (six) hours as needed for wheezing or shortness of breath. Hunsucker, Bonna Gains, MD  Active Self  amoxicillin-clavulanate (AUGMENTIN) 875-125 MG tablet [id number]  amoxicillin 875 mg-potassium clavulanate 125 mg tablet  TAKE 1 TABLET BY MOUTH EVERY 12 HOURS FOR 10 DAYS [provider]  Active   azithromycin (ZITHROMAX) 250 MG tablet [id number]  azithromycin 250 mg tablet  TAKE 2 TABLETS BY MOUTH ON DAY 1, AND THEN TAKE 1 TABLET BY MOUTH ONCE A DAY ON DAY 2 THROUGH DAY 5 [provider]  Active   azithromycin (ZITHROMAX) 250 MG tablet [id number]  Take by mouth. [provider]  Active   budesonide-formoterol [facility]) 160-4.5 MCG/ACT inhaler [id number]  Inhale 2 puffs into the lungs in the morning and at bedtime.  Patient not taking: Reported on 04/16/2021   Hunsucker, Bonna Gains, MD  Active Self  cephALEXin (KEFLEX) 500 MG capsule [id number]  cephalexin 500 mg capsule  TAKE 1 CAPSULE BY MOUTH TWICE DAILY FOR 10 DAYS [provider]  Active   cetirizine (ZYRTEC ALLERGY) 10 MG tablet [id number]  Take 1 tablet (10 mg total) by mouth daily. Alfredia Client, PA-C  Expired 04/16/21 2359 Self  cetirizine (ZYRTEC) 10 MG tablet [id number]  1 tablet [provider]  Active   COVID-19 At Home Test [facility] COVID-19 AG HOME TEST VI) [id number]  BinaxNOW COVID-19 Ag Self Test kit  Use as Directed on the Package [provider]  Active   diclofenac (VOLTAREN) 75 MG EC tablet [id number]  Take 75 mg by mouth 2 (two) times daily. [provider]  Active Self  diclofenac (VOLTAREN) 75 MG EC tablet [id number]  1 tablet as needed [provider]  Active   Famotidine (PEPCID PO) [id number]  Take 20 mg by mouth as needed (heartburn). [provider]  Active Self  famotidine (PEPCID) 20 MG tablet [id number]  1 tablet at bedtime as needed [provider]  Active   fluconazole (DIFLUCAN) 150 MG tablet [id number]  fluconazole 150 mg tablet  TAKE 1 TABLET BY MOUTH ONCE DAILY FOR 1 DAY [provider]  Active   fluticasone (CUTIVATE) 0.05 % cream [id number]  fluticasone propionate 0.05 % topical cream  APPLY TO FACE TWICE DAILY FOR IRRITATION [provider]  Active   furosemide (LASIX) 20 MG tablet [id number]  Take 20 mg by mouth daily. Oswald Hillock, MD  Active Self           Med Note Marvell Fuller   Tue Apr 16, 2021  4:27 PM)    nirmatrelvir & ritonavir (PAXLOVID, 300/100,) 20 x 150 MG & 10 x 100MG TBPK [id number]  Paxlovid 300 mg (150 mg x 2)-100 mg tablets in a dose pack (EUA)  TAKE AS DIRECTED TWICE DAILY FOR 5 DAYS [provider]  Active   oxyCODONE-acetaminophen (PERCOCET/ROXICET) 5-325 MG tablet [id number]  Take 1 tablet by mouth every 6 (six) hours as needed for moderate pain. Caren Griffins, MD  Active   Potassium Chloride ER 20 MEQ TBCR [id number]  Take 1 tablet by mouth daily. [provider]  Active Self  rosuvastatin (CRESTOR) 10 MG tablet [id number]  Take 10 mg by mouth daily. [provider]  Active Self           Patient Active Problem List   Diagnosis Date Noted   Allergic rhinitis 05/10/2021   Edema 05/10/2021   Family history of malignant neoplasm of gastrointestinal tract 05/10/2021   Generalized anxiety disorder 05/10/2021   History of adenomatous polyp of colon 05/10/2021   History of diverticulitis 05/10/2021   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[place]) 05/10/2021   Urinary incontinence 05/10/2021   UTI (urinary tract infection) 04/18/2021   OSA (obstructive sleep apnea) 04/18/2021   Chronic diastolic CHF (congestive heart failure) ([place]) 04/17/2021   Closed compression fracture of body of L1 vertebra ([place]) 04/17/2021   Compression fracture of L1 lumbar vertebra ([place]) 04/16/2021   Frequent falls 04/16/2021   Morbid obesity ([place]) 04/16/2021    Hypocalcemia 04/16/2021   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   Pain in right hand 03/28/2021   Retention of urine 94/17/4081   Diastolic dysfunction 44/81/8563   Chest pain at rest 06/13/2012   Hypokalemia 06/13/2012   Cardiomegaly 06/13/2012   Idiopathic intracranial hypertension 09/03/2011   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   Empty sella ([place]) 02/20/2011   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   Papilledema 12/20/2010   Hypercholesterolemia 08/12/2008   OBESITY 08/12/2008   HYPERTENSION 08/12/2008     SDOH:  (Social Determinants of Health) assessments and interventions performed:  SDOH Interventions    Flowsheet Row Most Recent Value  SDOH Interventions   Food Insecurity Interventions Intervention Not Indicated  Financial Strain Interventions Intervention Not Indicated  Housing Interventions Intervention Not Indicated  Intimate Partner Violence Interventions Intervention Not Indicated  Stress Interventions Intervention Not Indicated  Social Connections Interventions Intervention Not Indicated  Transportation Interventions Intervention Not Indicated  Care Plan  Review of patient past medical history, allergies, medications, health status, including review of consultants reports, laboratory and other test data, was performed as part of comprehensive evaluation for care management services.   Care Plan : RN Care Manager Plan of Care  Updates made by Barbaraann Faster, RN since 05/10/2021 12:00 AM     Problem: Complex Care Coordination Needs and disease management in patient with compression fracture, HTN, CHF   Priority: High  Onset Date: 05/10/2021     Long-Range Goal: Establish Plan of Care for Management Complex SDOH Barriers, disease management and Care Coordination Needs in patient with compression fracture, HTN, CHF   Start Date: 05/10/2021  This Visit's Progress: On track  Priority: High  Note:   Current Barriers:  Knowledge Deficits related to plan of care  for management of CHF, HTN, and compression fracture  Care Coordination needs related to Limited education about home care for CHF, HTN, compression fracture* Barriers: knowledge  1/31-04/18/21 admission for closed compression fracture of body of L1 vertebra -[facility] skilled nursing 04/18/21- 05/08/21 stay  RNCM Clinical Goal(s):  Patient will verbalize understanding of plan for management of CHF, HTN, and compression fracture as evidenced by decrease risk of re admission  through collaboration with RN Care manager, provider, and care team.   Interventions: Outreaches for care coordination, disease management, resources, home care/education needs Inter-disciplinary care team collaboration (see longitudinal plan of care) Evaluation of current treatment plan related to  self management and patient's adherence to plan as established by provider   Heart Failure Interventions:  (Status:  New goal.) Long Term Goal Assessed for worsening symptoms  Falls Interventions:  (Status:  New goal.) Short Term Goal Assessed for falls since last encounter RN CM discussed the importance of home de cluttering   Hypertension Interventions:  (Status:  New goal.) Short Term Goal Last practice recorded BP readings:  BP Readings from Last 3 Encounters:  04/18/21 (!) 161/76  07/16/20 (!) 155/90  06/23/20 (!) 165/79  Most recent eGFR/CrCl: No results found for: EGFR  No components found for: CRCL  Discussed plans with patient for ongoing care management follow up and provided patient with direct contact information for care management team  Patient Goals/Self-Care Activities: Take all medications as prescribed Attend all scheduled provider appointments Perform all self care activities independently  Call provider office for new concerns or questions   Follow Up Plan:  The patient has been provided with contact information for the care management team and has been advised to call with any health related  questions or concerns.  The care management team will reach out to the patient again over the next 30+ business days.       Plan:  The patient has been provided with contact information for the care management team and has been advised to call with any health related questions or concerns.  The care management team will reach out to the patient again over the next 30+ business days.   Junko Ohagan L. Lavina Hamman, RN, BSN, [place] Coordinator Office number [phone] Main [facility] number [phone] Fax number [phone]

## 2021-05-10 NOTE — Patient Outreach (Signed)
[place] [facility]) Care Management  05/10/2021  Ann Graham [birth year] [id number]   [facility] Unsuccessful outreach#2 Outreach attempt to the listed at the preferred outreach number in [place] No answer. [facility] RN CM left HIPAA [facility] Portability and Accountability Act) compliant voicemail message along with CMs contact info.      Subjective: Ann Graham is an 66 y.o. year old female who is a primary patient of Kristen Loader, FNP. The care management team was consulted for assistance with care management and/or care coordination needs.    Plan: The patient has been provided with contact information for the care management team and has been advised to call with any health related questions or concerns.  The care management team will reach out to the patient again over the next 30+ business  days. Unsuccessful outreach 05/07/21, 05/10/21 Mailed [facility] unsuccessful outreach letter 05/07/21   Ann Millin L. Lavina Hamman, RN, BSN, [place] Coordinator Office number [phone] Main [facility] number [phone] Fax number [phone]

## 2021-05-15 ENCOUNTER — Encounter: Payer: Self-pay | Admitting: *Deleted

## 2021-05-15 ENCOUNTER — Other Ambulatory Visit: Payer: Self-pay

## 2021-05-15 ENCOUNTER — Other Ambulatory Visit: Payer: Self-pay | Admitting: *Deleted

## 2021-05-15 NOTE — Patient Outreach (Signed)
[facility]) Care Management Telephonic RN Care Manager Note   05/15/2021 Name:  Ann Graham MRN:  [id number] DOB:  Apr 19, 1955  Summary: Completed second transition of care outreach after skilled nursing discharge Pt called and left RN CM a voice message on 05/14/21 about RN CM visiting her on 05/15/21 Pt called RN CM at 3/1/233 again prior to the scheduled outreach time as she was voicing concern about a possible home health visit for 05/15/21 as she would need to make arrangements to relocate her daughter's dog  Agreed to allow RN CM to follow up with [facility] related to next home visit. Voiced understanding that RN CM outreaches will only be telephonic Voiced understanding with the update from [facility]  She denies worsening needs today but interested in care coordination of the home health scheduling   Hypertension - admits to not having a BP cuff and not checking BP at home. She confirms she will be able to obtain/purchase a BP cuff and scales to assist in her monitoring her medical conditions after RN CM offered to assist with these devices  She reports her wt on 05/14/21 pcp visit was 255 lbs was 266 on 03/28/21 BP 155/84 She reports she has lost weight  Next pcp f/u 01/29/22 10 am  Covid she informs RN CM she does not prefer to wear masks and has none in the home    Sleep apnea patient reports she does not have a CPAP and is aware she needs the sleep apnea test. She confirms she wore a CPAP during her hospitalization   Compression fracture/pain Denies pain today Takes off brace in bed, sleeps on her left side braced by pillows for support   no longer taking pain medicine as it was causing constipation,  takes tylenol only   Urinary incontinence/leakage (chronic history) Takes diuretic early in day to prevent excessive pm restroom use. confirms incontinence, hx of bladder problems with use of incontinence pads, pelvic exercises    Recommendations/Changes made from  today's visit: Assessed for worsening symptoms and care coordination needs Discussed the differences in home health in person and [facility] telephonic visits  Outreach to [facility] [phone]. Spoke with Davita who confirms patient does not have another home visit scheduled until Thursday May 16 2021 with [facility] OT  Updated patient on response from [place] Discussed the importance of home monitoring of BP and weight, HTN & CHF action plans, offered to assist with DME Reviewed covid precautions, encouraged following precautions - masks Encouraged caution with positioning during sleep related to compression fracture and sleep apnea [facility] discussed - not preferred Reviewed [facility] progression-next outreach   Subjective: Ann Graham is an 66 y.o. year old female who is a primary patient of Kristen Loader, FNP. The care management team was consulted for assistance with care management and/or care coordination needs.    Telephonic RN Care Manager completed Telephone Visit today.   Objective:  Medications Reviewed Today     Reviewed by Barbaraann Faster, RN (Registered Nurse) on 05/10/21 at 1452  Med List Status: <None>   Medication Order Taking? Sig Documenting Provider Last Dose Status Informant  acetaZOLAMIDE (DIAMOX) 250 MG tablet [id number]  Take by mouth. [provider]  Active   albuterol (VENTOLIN HFA) 108 (90 Base) MCG/ACT inhaler [id number]  Inhale 2 puffs into the lungs every 6 (six) hours as needed for wheezing or shortness of breath. Hunsucker, Bonna Gains, MD  Active Self  amoxicillin-clavulanate (AUGMENTIN)  875-125 MG tablet [id number]  amoxicillin 875 mg-potassium clavulanate 125 mg tablet  TAKE 1 TABLET BY MOUTH EVERY 12 HOURS FOR 10 DAYS [provider]  Active   azithromycin (ZITHROMAX) 250 MG tablet [id number]  azithromycin 250 mg tablet  TAKE 2 TABLETS BY MOUTH ON DAY 1, AND THEN TAKE 1 TABLET BY MOUTH ONCE A DAY ON DAY 2 THROUGH DAY 5 [provider]   Active   azithromycin (ZITHROMAX) 250 MG tablet [id number]  Take by mouth. [provider]  Active   budesonide-formoterol [facility]) 160-4.5 MCG/ACT inhaler [id number]  Inhale 2 puffs into the lungs in the morning and at bedtime.  Patient not taking: Reported on 04/16/2021   Hunsucker, Bonna Gains, MD  Active Self  cephALEXin (KEFLEX) 500 MG capsule [id number]  cephalexin 500 mg capsule  TAKE 1 CAPSULE BY MOUTH TWICE DAILY FOR 10 DAYS [provider]  Active   cetirizine (ZYRTEC ALLERGY) 10 MG tablet [id number]  Take 1 tablet (10 mg total) by mouth daily. Alfredia Client, PA-C  Expired 04/16/21 2359 Self  cetirizine (ZYRTEC) 10 MG tablet [id number]  1 tablet [provider]  Active   COVID-19 At Home Test [facility] COVID-19 AG HOME TEST VI) [id number]  BinaxNOW COVID-19 Ag Self Test kit  Use as Directed on the Package [provider]  Active   diclofenac (VOLTAREN) 75 MG EC tablet [id number]  Take 75 mg by mouth 2 (two) times daily. [provider]  Active Self  diclofenac (VOLTAREN) 75 MG EC tablet [id number]  1 tablet as needed [provider]  Active   Famotidine (PEPCID PO) [id number]  Take 20 mg by mouth as needed (heartburn). [provider]  Active Self  famotidine (PEPCID) 20 MG tablet [id number]  1 tablet at bedtime as needed [provider]  Active   fluconazole (DIFLUCAN) 150 MG tablet [id number]  fluconazole 150 mg tablet  TAKE 1 TABLET BY MOUTH ONCE DAILY FOR 1 DAY [provider]  Active   fluticasone (CUTIVATE) 0.05 % cream [id number]  fluticasone propionate 0.05 % topical cream  APPLY TO FACE TWICE DAILY FOR IRRITATION [provider]  Active   furosemide (LASIX) 20 MG tablet [id number]  Take 20 mg by mouth daily. Oswald Hillock, MD  Active Self           Med Note Marvell Fuller   Tue Apr 16, 2021  4:27 PM)    nirmatrelvir & ritonavir (PAXLOVID, 300/100,) 20 x 150 MG & 10 x 100MG TBPK [id number]   Paxlovid 300 mg (150 mg x 2)-100 mg tablets in a dose pack (EUA)  TAKE AS DIRECTED TWICE DAILY FOR 5 DAYS [provider]  Active   oxyCODONE-acetaminophen (PERCOCET/ROXICET) 5-325 MG tablet [id number]  Take 1 tablet by mouth every 6 (six) hours as needed for moderate pain. Caren Griffins, MD  Active   Potassium Chloride ER 20 MEQ TBCR [id number]  Take 1 tablet by mouth daily. [provider]  Active Self  rosuvastatin (CRESTOR) 10 MG tablet [id number]  Take 10 mg by mouth daily. [provider]  Active Self             SDOH:  (Social Determinants of Health) assessments and interventions performed:  SDOH Interventions    Flowsheet Row Most Recent Value  SDOH Interventions   Financial Strain Interventions Intervention Not Indicated  Intimate Partner Violence Interventions Intervention Not Indicated  Stress Interventions Intervention Not Indicated  Transportation Interventions Intervention Not Indicated  Care Plan  Review of patient past medical history, allergies, medications, health status, including review of consultants reports, laboratory and other test data, was performed as part of comprehensive evaluation for care management services.   Care Plan : RN Care Manager Plan of Care  Updates made by Barbaraann Faster, RN since 05/15/2021 12:00 AM     Problem: Complex Care Coordination Needs and disease management in patient with compression fracture, HTN, CHF   Priority: High  Onset Date: 05/10/2021     Long-Range Goal: Establish Plan of Care for Management Complex SDOH Barriers, disease management and Care Coordination Needs in patient with compression fracture, HTN, CHF   Start Date: 05/10/2021  This Visit's Progress: On track  Recent Progress: On track  Priority: High  Note:   Current Barriers:  Knowledge Deficits related to plan of care for management of CHF, HTN, and compression fracture  Care Coordination needs related to Limited  education about home care for CHF, HTN, compression fracture* Barriers: knowledge  1/31-04/18/21 admission for closed compression fracture of body of L1 vertebra -[facility] skilled nursing 04/18/21- 05/08/21 stay  RN CM Clinical Goal(s):  Patient will verbalize understanding of plan for management of CHF, HTN, and compression fracture as evidenced by decrease risk of re admission  through collaboration with RN Care manager, provider, and care team.   Interventions: Outreaches for care coordination, disease management, resources, home care/education needs Inter-disciplinary care team collaboration (see longitudinal plan of care) Evaluation of current treatment plan related to  self management and patient's adherence to plan as established by provider 05/15/21 Discussed the differences in home health in person and [facility] telephonic visits  Outreach to [facility] [phone]. Spoke with Davita who confirms patient does not have another home visit scheduled until Thursday May 16 2021 with [facility] OT  Updated patient on response from [place] covid precautions, encouraged following precautions - masks [facility] discussed - not preferred Reviewed [facility] progression-next outreach   Heart Failure Interventions:  (Status:  Goal on track:  Yes.) Long Term Goal Assessed need for readable accurate scales in home Discussed importance of daily weight and advised patient to weigh and record daily Discussed the importance of keeping all appointments with provider Assessed for worsening symptoms Discussed the importance of home monitoring of BP and weight, HTN & CHF action plans, offered to assist with DME  Falls/compression fracture Interventions:  (Status:  Goal on track:  Yes.) Short Term Goal Assessed for falls since last encounter RN CM discussed the importance of home de cluttering Encouraged caution with positioning during sleep related to compression fracture and sleep apnea  Hypertension  Interventions:  (Status:  Goal on track:  Yes.) Short Term Goal Last practice recorded BP readings:  BP Readings from Last 3 Encounters:  04/18/21 (!) 161/76  07/16/20 (!) 155/90  06/23/20 (!) 165/79  Most recent eGFR/CrCl: No results found for: EGFR  No components found for: CRCL  Evaluation of current treatment plan related to hypertension self management and patient's adherence to plan as established by provider Reviewed medications with patient and discussed importance of compliance Discussed plans with patient for ongoing care management follow up and provided patient with direct contact information for care management team Advised patient, providing education and rationale, to monitor blood pressure daily and record, calling PCP for findings outside established parameters  Patient Goals/Self-Care Activities: Take all medications as prescribed Attend all scheduled provider appointments Perform all self care activities independently  Call provider office for new concerns  or questions   Follow Up Plan:  The patient has been provided with contact information for the care management team and has been advised to call with any health related questions or concerns.  The care management team will reach out to the patient again over the next 30+ business days.       Plan: The patient has been provided with contact information for the care management team and has been advised to call with any health related questions or concerns.  The care management team will reach out to the patient again over the next 30+business days.  Emmalyne Giacomo L. Lavina Hamman, RN, BSN, [place] Coordinator Office number [phone] [facility] number [phone] Fax number [phone]

## 2021-05-22 ENCOUNTER — Other Ambulatory Visit: Payer: Self-pay | Admitting: *Deleted

## 2021-05-22 ENCOUNTER — Other Ambulatory Visit: Payer: Self-pay

## 2021-05-22 NOTE — Patient Outreach (Signed)
[facility]) Care Management Telephonic RN Care Manager Note   05/22/2021 Name:  Ann Graham MRN:  [id number] DOB:  11-18-1955  Summary: Follow up outreach successful Today Ann Graham reports she is doing well but has not had much sleep as her daughter was taken to the ED for chest pain on last evening but discharged. Pt had to care for her 68 year old grandson during this time  She confirms she continues to work with [facility] PT (female) who visited on 05/21/21. She reports her progress is slow but steady Ann Graham reports she was recently able to finally get in her shower as her son in law reconstructed her shower to make it a walk in shower with bars. She is now able to independently use it with with her shower chair   Her left lower extremity (LLE) remains weaker denies swelling and pain. She confirms she is to see the orthopedic MD on   Denies depression   Hypertension (HTN) Purchased BP cuff & scales loss 11 since her fall, confirms she takes her diuretic   Recommendations/Changes made from today's visit: Assessed for worsening symptoms Discuss the relationship of sodium, fluids and potassium Encouraged rest today Assessed for depression symptoms  Subjective: Ann Graham is an 65 y.o. year old female who is a primary patient of Kristen Loader, FNP. The care management team was consulted for assistance with care management and/or care coordination needs.    Telephonic RN Care Manager completed Telephone Visit today.   Objective:  Medications Reviewed Today     Reviewed by Barbaraann Faster, RN (Registered Nurse) on 05/10/21 at 1452  Med List Status: <None>   Medication Order Taking? Sig Documenting Provider Last Dose Status Informant  acetaZOLAMIDE (DIAMOX) 250 MG tablet [id number]  Take by mouth. [provider]  Active   albuterol (VENTOLIN HFA) 108 (90 Base) MCG/ACT inhaler [id number]  Inhale 2 puffs into the lungs every 6 (six) hours as needed for  wheezing or shortness of breath. Hunsucker, Bonna Gains, MD  Active Self  amoxicillin-clavulanate (AUGMENTIN) 875-125 MG tablet [id number]  amoxicillin 875 mg-potassium clavulanate 125 mg tablet  TAKE 1 TABLET BY MOUTH EVERY 12 HOURS FOR 10 DAYS [provider]  Active   azithromycin (ZITHROMAX) 250 MG tablet [id number]  azithromycin 250 mg tablet  TAKE 2 TABLETS BY MOUTH ON DAY 1, AND THEN TAKE 1 TABLET BY MOUTH ONCE A DAY ON DAY 2 THROUGH DAY 5 [provider]  Active   azithromycin (ZITHROMAX) 250 MG tablet [id number]  Take by mouth. [provider]  Active   budesonide-formoterol [facility]) 160-4.5 MCG/ACT inhaler [id number]  Inhale 2 puffs into the lungs in the morning and at bedtime.  Patient not taking: Reported on 04/16/2021   Hunsucker, Bonna Gains, MD  Active Self  cephALEXin (KEFLEX) 500 MG capsule [id number]  cephalexin 500 mg capsule  TAKE 1 CAPSULE BY MOUTH TWICE DAILY FOR 10 DAYS [provider]  Active   cetirizine (ZYRTEC ALLERGY) 10 MG tablet [id number]  Take 1 tablet (10 mg total) by mouth daily. Alfredia Client, PA-C  Expired 04/16/21 2359 Self  cetirizine (ZYRTEC) 10 MG tablet [id number]  1 tablet [provider]  Active   COVID-19 At Home Test [facility] COVID-19 AG HOME TEST VI) [id number]  BinaxNOW COVID-19 Ag Self Test kit  Use as Directed on the Package [provider]  Active   diclofenac (VOLTAREN) 75 MG EC tablet [id number]  Take 75 mg  by mouth 2 (two) times daily. [provider]  Active Self  diclofenac (VOLTAREN) 75 MG EC tablet [id number]  1 tablet as needed [provider]  Active   Famotidine (PEPCID PO) [id number]  Take 20 mg by mouth as needed (heartburn). [provider]  Active Self  famotidine (PEPCID) 20 MG tablet [id number]  1 tablet at bedtime as needed [provider]  Active   fluconazole (DIFLUCAN) 150 MG tablet [id number]  fluconazole 150 mg tablet  TAKE 1 TABLET BY MOUTH ONCE  DAILY FOR 1 DAY [provider]  Active   fluticasone (CUTIVATE) 0.05 % cream [id number]  fluticasone propionate 0.05 % topical cream  APPLY TO FACE TWICE DAILY FOR IRRITATION [provider]  Active   furosemide (LASIX) 20 MG tablet [id number]  Take 20 mg by mouth daily. Oswald Hillock, MD  Active Self           Med Note Marvell Fuller   Tue Apr 16, 2021  4:27 PM)    nirmatrelvir & ritonavir (PAXLOVID, 300/100,) 20 x 150 MG & 10 x 100MG TBPK [id number]  Paxlovid 300 mg (150 mg x 2)-100 mg tablets in a dose pack (EUA)  TAKE AS DIRECTED TWICE DAILY FOR 5 DAYS [provider]  Active   oxyCODONE-acetaminophen (PERCOCET/ROXICET) 5-325 MG tablet [id number]  Take 1 tablet by mouth every 6 (six) hours as needed for moderate pain. Caren Griffins, MD  Active   Potassium Chloride ER 20 MEQ TBCR [id number]  Take 1 tablet by mouth daily. [provider]  Active Self  rosuvastatin (CRESTOR) 10 MG tablet [id number]  Take 10 mg by mouth daily. [provider]  Active Self             SDOH:  (Social Determinants of Health) assessments and interventions performed:    Care Plan  Review of patient past medical history, allergies, medications, health status, including review of consultants reports, laboratory and other test data, was performed as part of comprehensive evaluation for care management services.   Care Plan : RN Care Manager Plan of Care  Updates made by Barbaraann Faster, RN since 05/22/2021 12:00 AM     Problem: Complex Care Coordination Needs and disease management in patient with compression fracture, HTN, CHF   Priority: High  Onset Date: 05/10/2021     Long-Range Goal: Establish Plan of Care for Management Complex SDOH Barriers, disease management and Care Coordination Needs in patient with compression fracture, HTN, CHF   Start Date: 05/10/2021  This Visit's Progress: On track  Recent Progress: On track  Priority: High  Note:    Current Barriers:  Knowledge Deficits related to plan of care for management of CHF, HTN, and compression fracture  Care Coordination needs related to Limited education about home care for CHF, HTN, compression fracture* Barriers: knowledge  1/31-04/18/21 admission for closed compression fracture of body of L1 vertebra -[facility] skilled nursing 04/18/21- 05/08/21 stay  RN CM Clinical Goal(s):  Patient will verbalize understanding of plan for management of CHF, HTN, and compression fracture as evidenced by decrease risk of re admission  through collaboration with RN Care manager, provider, and care team.   Interventions: Outreaches for care coordination, disease management, resources, home care/education needs Inter-disciplinary care team collaboration (see longitudinal plan of care) Evaluation of current treatment plan related to  self management and patient's adherence to plan as established by provider 05/15/21 Discussed the differences in home health in person and [facility] telephonic visits  Outreach to [employer] [phone]. Spoke with Davita who confirms patient does not have another home visit scheduled until Thursday May 16 2021 with [facility] OT  Updated patient on response from [place] covid precautions, encouraged following precautions - masks [facility] discussed - not preferred Reviewed [facility] progression-next outreach   Heart Failure Interventions:  (Status:  Goal on track:  Yes.) Long Term Goal 05/22/21 denies swelling Has scales, denies weight increases Assessed need for readable accurate scales in home Discussed importance of daily weight and advised patient to weigh and record daily Discussed the importance of keeping all appointments with provider Assessed for worsening symptoms Discussed the importance of home monitoring of BP and weight, HTN & CHF action plans, offered to assist with DME Discuss the relationship of sodium, fluids and potassium Encouraged rest today Assessed for  depression symptoms  Falls/compression fracture Interventions:  (Status:  Goal on track:  Yes.) Short Term Goal 05/22/21 denies fall  Assessed for falls since last encounter RN CM discussed the importance of home de cluttering Encouraged caution with positioning during sleep related to compression fracture and sleep apnea  Hypertension Interventions:  (Status:  Goal on track:  Yes.) Short Term Goal 05/22/21 reports has BP cuff Denies abnormal values Last practice recorded BP readings:  BP Readings from Last 3 Encounters:  04/18/21 (!) 161/76  07/16/20 (!) 155/90  06/23/20 (!) 165/79  Most recent eGFR/CrCl: No results found for: EGFR  No components found for: CRCL  Evaluation of current treatment plan related to hypertension self management and patient's adherence to plan as established by provider Reviewed medications with patient and discussed importance of compliance Discussed plans with patient for ongoing care management follow up and provided patient with direct contact information for care management team Advised patient, providing education and rationale, to monitor blood pressure daily and record, calling PCP for findings outside established parameters  Patient Goals/Self-Care Activities: Take all medications as prescribed Attend all scheduled provider appointments Perform all self care activities independently  Call provider office for new concerns or questions   Follow Up Plan:  The patient has been provided with contact information for the care management team and has been advised to call with any health related questions or concerns.  The care management team will reach out to the patient again over the next 30+ business days.       Plan: The patient has been provided with contact information for the care management team and has been advised to call with any health related questions or concerns.  The care management team will reach out to the patient again over the next 30+  business days.  Dayanna Pryce L. Lavina Hamman, RN, BSN, [place] Coordinator Office number [phone] [facility] number [phone] Fax number [phone]

## 2021-05-29 ENCOUNTER — Other Ambulatory Visit: Payer: Self-pay | Admitting: *Deleted

## 2021-05-29 NOTE — Patient Outreach (Signed)
[facility]) Care Management ?Telephonic RN Care Manager Note ? ? ?05/29/2021 ?Name:  Pattricia Weiher MRN:  [id number] DOB:  1955/08/25 ? ?Summary: ?THN Unsuccessful outreach ?Outreach attempt to the listed at the preferred outreach number in [place] ?No answer. THN RN CM left HIPAA [facility] Portability and Accountability Act) compliant voicemail message along with CM?s contact info.  ? ? ?Subjective: ?Leba Tibbitts is an 66 y.o. year old female who is a primary patient of Kristen Loader, FNP. The care management team was consulted for assistance with care management and/or care coordination needs.   ? ?Telephonic RN Care Manager completed Telephone Visit today.  ? ?Objective: ? ?Medications Reviewed Today   ? ? Reviewed by Barbaraann Faster, RN (Registered Nurse) on 05/10/21 at 1452  Med List Status: <None>  ? ?Medication Order Taking? Sig Documenting Provider Last Dose Status Informant  ?acetaZOLAMIDE (DIAMOX) 250 MG tablet [id number]  Take by mouth. [provider]  Active   ?albuterol (VENTOLIN HFA) 108 (90 Base) MCG/ACT inhaler [id number]  Inhale 2 puffs into the lungs every 6 (six) hours as needed for wheezing or shortness of breath. Hunsucker, Bonna Gains, MD  Active Self  ?amoxicillin-clavulanate (AUGMENTIN) 875-125 MG tablet [id number]  amoxicillin 875 mg-potassium clavulanate 125 mg tablet ? TAKE 1 TABLET BY MOUTH EVERY 12 HOURS FOR 10 DAYS [provider]  Active   ?azithromycin (ZITHROMAX) 250 MG tablet [id number]  azithromycin 250 mg tablet ? TAKE 2 TABLETS BY MOUTH ON DAY 1, AND THEN TAKE 1 TABLET BY MOUTH ONCE A DAY ON DAY 2 THROUGH DAY 5 [provider]  Active   ?azithromycin (ZITHROMAX) 250 MG tablet [id number]  Take by mouth. [provider]  Active   ?budesonide-formoterol (SYMBICORT) 160-4.5 MCG/ACT inhaler [id number]  Inhale 2 puffs into the lungs in the morning and at bedtime.  ?Patient not taking: Reported on 04/16/2021  ?  Hunsucker, Bonna Gains, MD  Active Self  ?cephALEXin (KEFLEX) 500 MG capsule [id number]  cephalexin 500 mg capsule ? TAKE 1 CAPSULE BY MOUTH TWICE DAILY FOR 10 DAYS [provider]  Active   ?cetirizine (ZYRTEC ALLERGY) 10 MG tablet [id number]  Take 1 tablet (10 mg total) by mouth daily. Alfredia Client, PA-C  Expired 04/16/21 2359 Self  ?cetirizine (ZYRTEC) 10 MG tablet [id number]  1 tablet [provider]  Active   ?COVID-19 At Home Test [facility] COVID-19 AG HOME TEST VI) [id number]  BinaxNOW COVID-19 Ag Self Test kit ? Use as Directed on the Package [provider]  Active   ?diclofenac (VOLTAREN) 75 MG EC tablet [id number]  Take 75 mg by mouth 2 (two) times daily. [provider]  Active Self  ?diclofenac (VOLTAREN) 75 MG EC tablet [id number]  1 tablet as needed [provider]  Active   ?Famotidine (PEPCID PO) [id number]  Take 20 mg by mouth as needed (heartburn). [provider]  Active Self  ?famotidine (PEPCID) 20 MG tablet [id number]  1 tablet at bedtime as needed [provider]  Active   ?fluconazole (DIFLUCAN) 150 MG tablet [id number]  fluconazole 150 mg tablet ? TAKE 1 TABLET BY MOUTH ONCE DAILY FOR 1 DAY [provider]  Active   ?fluticasone (CUTIVATE) 0.05 % cream [id number]  fluticasone propionate 0.05 % topical cream ? APPLY TO FACE TWICE DAILY FOR IRRITATION [provider]  Active   ?furosemide (LASIX) 20 MG tablet [id number]  Take 20 mg by mouth daily. Eleonore Chiquito  S, MD  Active Self  ?         ?Med Note Tory Emerald Apr 16, 2021  4:27 PM)    ?nirmatrelvir & ritonavir (PAXLOVID, 300/100,) 20 x 150 MG & 10 x 100MG TBPK [id number]  Paxlovid 300 mg (150 mg x 2)-100 mg tablets in a dose pack (EUA)  TAKE AS DIRECTED TWICE DAILY FOR 5 DAYS [provider]  Active   ?oxyCODONE-acetaminophen (PERCOCET/ROXICET) 5-325 MG tablet [id number]  Take 1 tablet by mouth every 6 (six) hours as needed for moderate pain. Caren Griffins, MD  Active   ?Potassium Chloride ER 20 MEQ TBCR [id number]  Take 1 tablet by mouth daily. [provider]  Active Self  ?rosuvastatin (CRESTOR) 10 MG tablet [id number]  Take 10 mg by mouth daily. [provider]  Active Self  ? ?  ?  ? ?  ? ? ? ?SDOH:  (Social Determinants of Health) assessments and interventions performed:  ? ? ?Care Plan ? ?Review of patient past medical history, allergies, medications, health status, including review of consultants reports, laboratory and other test data, was performed as part of comprehensive evaluation for care management services.  ? ?There are no care plans that you recently modified to display for this patient. ?  ? ?Plan: The patient has been provided with contact information for the care management team and has been advised to call with any health related questions or concerns.  ?THN RN CM scheduled this patient for another call attempt within 30+ business days ?Unsuccessful outreach on 05/29/21 ? ? ?Reinhardt Licausi L. Lavina Hamman, RN, BSN, CCM ?[place] Management Care Coordinator ?Office number [phone] ?[facility] number [phone] ?Fax number [phone] ? ? ? ? ? ?

## 2021-06-12 ENCOUNTER — Other Ambulatory Visit: Payer: Self-pay | Admitting: *Deleted

## 2021-06-12 NOTE — Patient Outreach (Signed)
[facility]) Care Management ?Telephonic RN Care Manager Note ? ? ?06/12/2021 ?Name:  Ann Graham MRN:  [id number] DOB:  1955-09-28 ? ?Summary: ?4th transition of care call on 05/29/21  ?Today was a follow up progression outreach ? ?THN Unsuccessful outreach ?Outreach attempt to the listed at the preferred outreach number in [place] ?No answer. THN RN CM left HIPAA [facility] Portability and Accountability Act) compliant voicemail message along with CM?s contact info.  ? ?Subjective: ?Ann Graham is an 66 y.o. year old female who is a primary patient of Kristen Loader, FNP. The care management team was consulted for assistance with care management and/or care coordination needs.   ? ?Telephonic RN Care Manager completed Telephone Visit today.  ? ?Objective: ? ?Medications Reviewed Today   ? ? Reviewed by Barbaraann Faster, RN (Registered Nurse) on 05/10/21 at 1452  Med List Status: <None>  ? ?Medication Order Taking? Sig Documenting Provider Last Dose Status Informant  ?acetaZOLAMIDE (DIAMOX) 250 MG tablet [id number]  Take by mouth. [provider]  Active   ?albuterol (VENTOLIN HFA) 108 (90 Base) MCG/ACT inhaler [id number]  Inhale 2 puffs into the lungs every 6 (six) hours as needed for wheezing or shortness of breath. Hunsucker, Bonna Gains, MD  Active Self  ?amoxicillin-clavulanate (AUGMENTIN) 875-125 MG tablet [id number]  amoxicillin 875 mg-potassium clavulanate 125 mg tablet ? TAKE 1 TABLET BY MOUTH EVERY 12 HOURS FOR 10 DAYS [provider]  Active   ?azithromycin (ZITHROMAX) 250 MG tablet [id number]  azithromycin 250 mg tablet ? TAKE 2 TABLETS BY MOUTH ON DAY 1, AND THEN TAKE 1 TABLET BY MOUTH ONCE A DAY ON DAY 2 THROUGH DAY 5 [provider]  Active   ?azithromycin (ZITHROMAX) 250 MG tablet [id number]  Take by mouth. [provider]  Active   ?budesonide-formoterol (SYMBICORT) 160-4.5 MCG/ACT inhaler [id number]  Inhale 2 puffs into the lungs  in the morning and at bedtime.  ?Patient not taking: Reported on 04/16/2021  ? Hunsucker, Bonna Gains, MD  Active Self  ?cephALEXin (KEFLEX) 500 MG capsule [id number]  cephalexin 500 mg capsule ? TAKE 1 CAPSULE BY MOUTH TWICE DAILY FOR 10 DAYS [provider]  Active   ?cetirizine (ZYRTEC ALLERGY) 10 MG tablet [id number]  Take 1 tablet (10 mg total) by mouth daily. Alfredia Client, PA-C  Expired 04/16/21 2359 Self  ?cetirizine (ZYRTEC) 10 MG tablet [id number]  1 tablet [provider]  Active   ?COVID-19 At Home Test [facility] COVID-19 AG HOME TEST VI) [id number]  BinaxNOW COVID-19 Ag Self Test kit ? Use as Directed on the Package [provider]  Active   ?diclofenac (VOLTAREN) 75 MG EC tablet [id number]  Take 75 mg by mouth 2 (two) times daily. [provider]  Active Self  ?diclofenac (VOLTAREN) 75 MG EC tablet [id number]  1 tablet as needed [provider]  Active   ?Famotidine (PEPCID PO) [id number]  Take 20 mg by mouth as needed (heartburn). [provider]  Active Self  ?famotidine (PEPCID) 20 MG tablet [id number]  1 tablet at bedtime as needed [provider]  Active   ?fluconazole (DIFLUCAN) 150 MG tablet [id number]  fluconazole 150 mg tablet ? TAKE 1 TABLET BY MOUTH ONCE DAILY FOR 1 DAY [provider]  Active   ?fluticasone (CUTIVATE) 0.05 % cream [id number]  fluticasone propionate 0.05 % topical cream ? APPLY TO FACE TWICE DAILY FOR IRRITATION [provider]  Active   ?  furosemide (LASIX) 20 MG tablet [id number]  Take 20 mg by mouth daily. Oswald Hillock, MD  Active Self  ?         ?Med Note Tory Emerald Apr 16, 2021  4:27 PM)    ?nirmatrelvir & ritonavir (PAXLOVID, 300/100,) 20 x 150 MG & 10 x 100MG TBPK [id number]  Paxlovid 300 mg (150 mg x 2)-100 mg tablets in a dose pack (EUA)  TAKE AS DIRECTED TWICE DAILY FOR 5 DAYS [provider]  Active   ?oxyCODONE-acetaminophen (PERCOCET/ROXICET) 5-325 MG tablet [id number]  Take  1 tablet by mouth every 6 (six) hours as needed for moderate pain. Caren Griffins, MD  Active   ?Potassium Chloride ER 20 MEQ TBCR [id number]  Take 1 tablet by mouth daily. [provider]  Active Self  ?rosuvastatin (CRESTOR) 10 MG tablet [id number]  Take 10 mg by mouth daily. [provider]  Active Self  ? ?  ?  ? ?  ? ? ? ?SDOH:  (Social Determinants of Health) assessments and interventions performed:  ? ? ?Care Plan ? ?Review of patient past medical history, allergies, medications, health status, including review of consultants reports, laboratory and other test data, was performed as part of comprehensive evaluation for care management services.  ? ?There are no care plans that you recently modified to display for this patient. ?  ? ? ?Plan: ?[facility] RN CM scheduled this patient for another call attempt within 4-7 business days ?Unsuccessful outreach on 06/12/21 ? ? ?Anson Peddie L. Lavina Hamman, RN, BSN, CCM ?[place] Management Care Coordinator ?Office number [phone] ? ? ? ? ?

## 2021-06-13 ENCOUNTER — Other Ambulatory Visit: Payer: Self-pay | Admitting: *Deleted

## 2021-06-13 ENCOUNTER — Encounter: Payer: Self-pay | Admitting: *Deleted

## 2021-06-13 NOTE — Patient Outreach (Addendum)
[place] [facility]) Care Management ?Telephonic RN Care Manager Note ? ? ?06/14/2021 ?Name:  Ann Graham MRN:  [id number] DOB:  [birth year] ? ?Summary: ?Ann Graham returned a call to RN CM after  message was left for her  ?Completed transition of care assessment (snf)  ?Reviewed for initiation of complex care services but noted patient with possible external CM services. Patient and RN CM to outreach to pcp office related this prior to case closure. ? ?She denies worsening symptoms with HTN and CHF ? ?She reports her [facility] home health therapies (PT/OT)have been cancelled as of last week related various reasons reported by pt (large dog, home construction, timing of visits, illnesses of her and family members, not vaccinated) ?The home health supervisor visited on Tuesday 06/04/21 and she shared her concerns  ?Pt reports she is not ready to drive ?Does not use brace any more after speaking with MD per pt  ?Now has a walk in shower, able to bath and dress self independently Denies issues with ambulation ?Recent cold, stomach virus experienced but she now reports symptoms resolved ? ? ?She confirms she has not been informed of an external care management program (pcp) She is to check her pcp portal when she is able to access it  ? ? ?Recommendations/Changes made from today's visit: ?Assessed for therapy progression, HTN/CHF possible worsening s/s ?Allowed pt time to ventilate ?Reviewed THN progression and potential external care management program (pcp) ?Encouraged her to access her pcp portal for possible pharmacy/CRN external services and update RN CM  ?Referral to external CM vendor via e-mail ? ? ?Subjective: ?Ann Graham is an 66 y.o. year old female who is a primary patient of Kristen Loader, FNP. The care management team was consulted for assistance with care management and/or care coordination needs.   ? ?Telephonic RN Care Manager completed Telephone Visit today.  ? ?Objective: ? ?Medications  Reviewed Today   ? ? Reviewed by Barbaraann Faster, RN (Registered Nurse) on 05/10/21 at 1452  Med List Status: <None>  ? ?Medication Order Taking? Sig Documenting Provider Last Dose Status Informant  ?acetaZOLAMIDE (DIAMOX) 250 MG tablet [id number]  Take by mouth. [provider]  Active   ?albuterol (VENTOLIN HFA) 108 (90 Base) MCG/ACT inhaler [id number]  Inhale 2 puffs into the lungs every 6 (six) hours as needed for wheezing or shortness of breath. Hunsucker, Bonna Gains, MD  Active Self  ?amoxicillin-clavulanate (AUGMENTIN) 875-125 MG tablet [id number]  amoxicillin 875 mg-potassium clavulanate 125 mg tablet ? TAKE 1 TABLET BY MOUTH EVERY 12 HOURS FOR 10 DAYS [provider]  Active   ?azithromycin (ZITHROMAX) 250 MG tablet [id number]  azithromycin 250 mg tablet ? TAKE 2 TABLETS BY MOUTH ON DAY 1, AND THEN TAKE 1 TABLET BY MOUTH ONCE A DAY ON DAY 2 THROUGH DAY 5 [provider]  Active   ?azithromycin (ZITHROMAX) 250 MG tablet [id number]  Take by mouth. [provider]  Active   ?budesonide-formoterol (SYMBICORT) 160-4.5 MCG/ACT inhaler [id number]  Inhale 2 puffs into the lungs in the morning and at bedtime.  ?Patient not taking: Reported on 04/16/2021  ? Hunsucker, Bonna Gains, MD  Active Self  ?cephALEXin (KEFLEX) 500 MG capsule [id number]  cephalexin 500 mg capsule ? TAKE 1 CAPSULE BY MOUTH TWICE DAILY FOR 10 DAYS [provider]  Active   ?cetirizine (ZYRTEC ALLERGY) 10 MG tablet [id number]  Take 1 tablet (10 mg total) by mouth daily. Alfredia Client, PA-C  Expired 04/16/21  2359 Self  ?cetirizine (ZYRTEC) 10 MG tablet [id number]  1 tablet [provider]  Active   ?COVID-19 At Home Test [facility] COVID-19 AG HOME TEST VI) [id number]  BinaxNOW COVID-19 Ag Self Test kit ? Use as Directed on the Package [provider]  Active   ?diclofenac (VOLTAREN) 75 MG EC tablet [id number]  Take 75 mg by mouth 2 (two) times daily. [provider]  Active Self   ?diclofenac (VOLTAREN) 75 MG EC tablet [id number]  1 tablet as needed [provider]  Active   ?Famotidine (PEPCID PO) [id number]  Take 20 mg by mouth as needed (heartburn). [provider]  Active Self  ?famotidine (PEPCID) 20 MG tablet [id number]  1 tablet at bedtime as needed [provider]  Active   ?fluconazole (DIFLUCAN) 150 MG tablet [id number]  fluconazole 150 mg tablet ? TAKE 1 TABLET BY MOUTH ONCE DAILY FOR 1 DAY [provider]  Active   ?fluticasone (CUTIVATE) 0.05 % cream [id number]  fluticasone propionate 0.05 % topical cream ? APPLY TO FACE TWICE DAILY FOR IRRITATION [provider]  Active   ?furosemide (LASIX) 20 MG tablet [id number]  Take 20 mg by mouth daily. Oswald Hillock, MD  Active Self  ?         ?Med Note Tory Emerald Apr 16, 2021  4:27 PM)    ?nirmatrelvir & ritonavir (PAXLOVID, 300/100,) 20 x 150 MG & 10 x 100MG TBPK [id number]  Paxlovid 300 mg (150 mg x 2)-100 mg tablets in a dose pack (EUA)  TAKE AS DIRECTED TWICE DAILY FOR 5 DAYS [provider]  Active   ?oxyCODONE-acetaminophen (PERCOCET/ROXICET) 5-325 MG tablet [id number]  Take 1 tablet by mouth every 6 (six) hours as needed for moderate pain. Caren Griffins, MD  Active   ?Potassium Chloride ER 20 MEQ TBCR [id number]  Take 1 tablet by mouth daily. [provider]  Active Self  ?rosuvastatin (CRESTOR) 10 MG tablet [id number]  Take 10 mg by mouth daily. [provider]  Active Self  ? ?  ?  ? ?  ? ? ? ?SDOH:  (Social Determinants of Health) assessments and interventions performed:  ? ? ?Care Plan ? ?Review of patient past medical history, allergies, medications, health status, including review of consultants reports, laboratory and other test data, was performed as part of comprehensive evaluation for care management services.  ? ?Care Plan : RN Care Manager Plan of Care  ?Updates made by Barbaraann Faster, RN since 06/14/2021 12:00 AM  ?  ? ?Problem:  Complex Care Coordination Needs and disease management in patient with compression fracture, HTN, CHF   ?Priority: High  ?Onset Date: 05/10/2021  ?  ? ?Long-Range Goal: Establish Plan of Care for Management Complex SDOH Barriers, disease management and Care Coordination Needs in patient with compression fracture, HTN, CHF   ?Start Date: 05/10/2021  ?This Visit's Progress: On track  ?Recent Progress: On track  ?Priority: High  ?Note:   ?Current Barriers:  ?Knowledge Deficits related to plan of care for management of CHF, HTN, and compression fracture  ?Care Coordination needs related to Limited education about home care for CHF, HTN, compression fracture* ?Barriers: knowledge ?Barriers: home construction for improvements for her, one of her daughters and her family & large dog have moved in with her, no covid vaccines ? 1/31-04/18/21 admission for closed compression fracture of body of L1 vertebra -[facility] skilled nursing 04/18/21- 05/08/21 stay ? ?RN CM Clinical Goal(s):  ?  Patient will verbalize understanding of plan for management of CHF, HTN, and compression fracture as evidenced by decrease risk of re admission  through collaboration with RN Care manager, provider, and care team.  ? ?Interventions: ?Outreaches for care coordination, disease management, resources, home care/education needs ?Inter-disciplinary care team collaboration (see longitudinal plan of care) ?Evaluation of current treatment plan related to  self management and patient's adherence to plan as established by provider ?05/15/21 Discussed the differences in home health in person and [facility] telephonic visits  ?Outreach to [employer] [phone]. Spoke with [facility] who confirms patient does not have another home visit scheduled until Thursday May 16 2021 with [facility] OT  ?Updated patient on response from [place] ?Reviewed covid precautions, encouraged following precautions - masks ?[facility] discussed - not preferred ?Reviewed THN progression-next outreach   ?06/13/21 Referral to external CM vendor via e-mail. Encouraged her to access her pcp portal for possible pharmacy/CRN external services and update RN CM ? ?Heart Failure Interventions:  (Status:  Goal on track:  Yes.) Long

## 2021-06-19 ENCOUNTER — Ambulatory Visit: Payer: Self-pay | Admitting: *Deleted

## 2021-07-24 ENCOUNTER — Other Ambulatory Visit: Payer: Self-pay | Admitting: *Deleted

## 2021-07-24 NOTE — Patient Outreach (Signed)
[place] [facility]) Care Management ?Telephonic RN Care Manager Note ? ? ?07/24/2021 ?Name:  Yaneisy Wenz MRN:  [id number] DOB:  01/03/1956 ? ?Summary: ?THN Unsuccessful outreach to verify if her externa vendor CRN has initiated services  ?Outreach attempt to the listed at the preferred outreach number in [place]  ?No answer. THN RN CM left HIPAA [facility] Portability and Accountability Act) compliant voicemail message along with CM?s contact info.  ? ?Subjective: ?Ann Graham is an 66 y.o. year old female who is a primary patient of Kristen Loader, FNP. The care management team was consulted for assistance with care management and/or care coordination needs.   ? ?Telephonic RN Care Manager completed Telephone Visit today.  ? ?Objective: ? ?Medications Reviewed Today   ? ? Reviewed by Barbaraann Faster, RN (Registered Nurse) on 05/10/21 at 1452  Med List Status: <None>  ? ?Medication Order Taking? Sig Documenting Provider Last Dose Status Informant  ?acetaZOLAMIDE (DIAMOX) 250 MG tablet [id number]  Take by mouth. [provider]  Active   ?albuterol (VENTOLIN HFA) 108 (90 Base) MCG/ACT inhaler [id number]  Inhale 2 puffs into the lungs every 6 (six) hours as needed for wheezing or shortness of breath. Hunsucker, Bonna Gains, MD  Active Self  ?amoxicillin-clavulanate (AUGMENTIN) 875-125 MG tablet [id number]  amoxicillin 875 mg-potassium clavulanate 125 mg tablet ? TAKE 1 TABLET BY MOUTH EVERY 12 HOURS FOR 10 DAYS [provider]  Active   ?azithromycin (ZITHROMAX) 250 MG tablet [id number]  azithromycin 250 mg tablet ? TAKE 2 TABLETS BY MOUTH ON DAY 1, AND THEN TAKE 1 TABLET BY MOUTH ONCE A DAY ON DAY 2 THROUGH DAY 5 [provider]  Active   ?azithromycin (ZITHROMAX) 250 MG tablet [id number]  Take by mouth. [provider]  Active   ?budesonide-formoterol (SYMBICORT) 160-4.5 MCG/ACT inhaler [id number]  Inhale 2 puffs into the lungs in the morning and at bedtime.  ?Patient  not taking: Reported on 04/16/2021  ? Hunsucker, Bonna Gains, MD  Active Self  ?cephALEXin (KEFLEX) 500 MG capsule [id number]  cephalexin 500 mg capsule ? TAKE 1 CAPSULE BY MOUTH TWICE DAILY FOR 10 DAYS [provider]  Active   ?cetirizine (ZYRTEC ALLERGY) 10 MG tablet [id number]  Take 1 tablet (10 mg total) by mouth daily. Alfredia Client, PA-C  Expired 04/16/21 2359 Self  ?cetirizine (ZYRTEC) 10 MG tablet [id number]  1 tablet [provider]  Active   ?COVID-19 At Home Test [facility] COVID-19 AG HOME TEST VI) [id number]  BinaxNOW COVID-19 Ag Self Test kit ? Use as Directed on the Package [provider]  Active   ?diclofenac (VOLTAREN) 75 MG EC tablet [id number]  Take 75 mg by mouth 2 (two) times daily. [provider]  Active Self  ?diclofenac (VOLTAREN) 75 MG EC tablet [id number]  1 tablet as needed [provider]  Active   ?Famotidine (PEPCID PO) [id number]  Take 20 mg by mouth as needed (heartburn). [provider]  Active Self  ?famotidine (PEPCID) 20 MG tablet [id number]  1 tablet at bedtime as needed [provider]  Active   ?fluconazole (DIFLUCAN) 150 MG tablet [id number]  fluconazole 150 mg tablet ? TAKE 1 TABLET BY MOUTH ONCE DAILY FOR 1 DAY [provider]  Active   ?fluticasone (CUTIVATE) 0.05 % cream [id number]  fluticasone propionate 0.05 % topical cream ? APPLY TO FACE TWICE DAILY FOR IRRITATION [provider]  Active   ?furosemide (LASIX) 20 MG tablet [id number]  Take 20 mg by mouth daily. Oswald Hillock, MD  Active Self  ?         ?Med Note Tory Emerald Apr 16, 2021  4:27 PM)    ?nirmatrelvir & ritonavir (PAXLOVID, 300/100,) 20 x 150 MG & 10 x 100MG TBPK [id number]  Paxlovid 300 mg (150 mg x 2)-100 mg tablets in a dose pack (EUA)  TAKE AS DIRECTED TWICE DAILY FOR 5 DAYS [provider]  Active   ?oxyCODONE-acetaminophen (PERCOCET/ROXICET) 5-325 MG tablet [id number]  Take 1 tablet by mouth every 6 (six) hours as  needed for moderate pain. Caren Griffins, MD  Active   ?Potassium Chloride ER 20 MEQ TBCR [id number]  Take 1 tablet by mouth daily. [provider]  Active Self  ?rosuvastatin (CRESTOR) 10 MG tablet [id number]  Take 10 mg by mouth daily. [provider]  Active Self  ? ?  ?  ? ?  ? ? ? ?SDOH:  (Social Determinants of Health) assessments and interventions performed:  ? ? ?Care Plan ? ?Review of patient past medical history, allergies, medications, health status, including review of consultants reports, laboratory and other test data, was performed as part of comprehensive evaluation for care management services.  ? ?There are no care plans that you recently modified to display for this patient. ?  ? ?Plan: The patient has been provided with contact information for the care management team and has been advised to call with any health related questions or concerns.  ?The care management team will reach out to the patient again over the next 30+ business days. ? ?Taleshia Luff L. Lavina Hamman, RN, BSN, CCM ?[place] Management Care Coordinator ?Office number [phone] ?[facility] number [phone] ?Fax number [phone] ? ? ? ? ? ? ? ?

## 2021-07-30 ENCOUNTER — Ambulatory Visit: Payer: Medicare Other | Attending: Neurosurgery | Admitting: Physical Therapy

## 2021-07-30 ENCOUNTER — Encounter: Payer: Self-pay | Admitting: Physical Therapy

## 2021-07-30 DIAGNOSIS — M5459 Other low back pain: Secondary | ICD-10-CM

## 2021-07-30 DIAGNOSIS — M6281 Muscle weakness (generalized): Secondary | ICD-10-CM | POA: Diagnosis present

## 2021-07-30 DIAGNOSIS — R296 Repeated falls: Secondary | ICD-10-CM | POA: Diagnosis present

## 2021-07-30 DIAGNOSIS — R2681 Unsteadiness on feet: Secondary | ICD-10-CM | POA: Diagnosis present

## 2021-07-30 NOTE — Therapy (Signed)
?OUTPATIENT PHYSICAL THERAPY THORACOLUMBAR EVALUATION ? ? ?Patient Name: Ann Graham ?MRN: [id number] ?DOB:04/23/1955, 66 y.o., female ?Today's Date: 07/30/2021 ? ? PT End of Session - 07/30/21 1616   ? ? Visit Number 1   ? Number of Visits 17   ? Date for PT Re-Evaluation 09/24/21   ? Authorization Type MCR and Aetna   ? Authorization Time Period 07/27/21 to 09/24/21   ? Progress Note Due on Visit 10   ? PT Start Time 1534   ? PT Stop Time 5361   ? PT Time Calculation (min) 39 min   ? Activity Tolerance Patient tolerated treatment well   ? Behavior During Therapy [facility] for tasks assessed/performed   ? ?  ?  ? ?  ? ? ?Past Medical History:  ?Diagnosis Date  ? Anxiety   ? 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 Elevated cholesterol   ? Hx of cardiovascular stress test   ? Lexiscan Myoview 4/14:  No ischemia, EF 75%.  ? Hx of echocardiogram   ? Echo 3/14: EF 55-60%, Gr 1 DD, mild LAE  ? Hypertension   ? ?Past Surgical History:  ?Procedure Laterality Date  ? DILATION AND CURETTAGE OF UTERUS    ? ?Patient Active Problem List  ? Diagnosis Date Noted  ? Allergic rhinitis 05/10/2021  ? Edema 05/10/2021  ? Family history of malignant neoplasm of gastrointestinal tract 05/10/2021  ? Generalized anxiety disorder 05/10/2021  ? History of adenomatous polyp of colon 05/10/2021  ? History of diverticulitis 05/10/2021  ? 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[place]) 05/10/2021  ? Urinary incontinence 05/10/2021  ? UTI (urinary tract infection) 04/18/2021  ? OSA (obstructive sleep apnea) 04/18/2021  ? Chronic diastolic CHF (congestive heart failure) ([place]) 04/17/2021  ? Closed compression fracture of body of L1 vertebra ([place]) 04/17/2021  ? Compression fracture of L1 lumbar vertebra ([place]) 04/16/2021  ? Frequent falls 04/16/2021  ? Morbid obesity ([place]) 04/16/2021  ? Hypocalcemia 04/16/2021  ? 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  ? Pain in right hand 03/28/2021  ? Retention of urine 06/19/2020  ? Diastolic dysfunction 44/31/5400  ? Chest pain at  rest 06/13/2012  ? Hypokalemia 06/13/2012  ? Cardiomegaly 06/13/2012  ? Idiopathic intracranial hypertension 09/03/2011  ? 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  ? Empty sella ([place]) 02/20/2011  ? 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[place]) 02/10/2011  ? Papilledema 12/20/2010  ? Hypercholesterolemia 08/12/2008  ? OBESITY 08/12/2008  ? HYPERTENSION 08/12/2008  ? ? ?PCP: Jenny Reichmann  ? ?REFERRING PROVIDER: Consuella Lose  ? ?REFERRING DIAG: S32.010D (ICD-10-CM) - Wedge compression fracture of first lumbar vertebra, subsequent encounter for fracture with routine healing  ? ?THERAPY DIAG:  ?Muscle weakness (generalized) ? ?Repeated falls ? ?Unsteadiness on feet ? ?Other low back pain ? ?ONSET DATE: 07/15/2021  ? ?SUBJECTIVE:                                                                                                                                                                                          ? ?  SUBJECTIVE STATEMENT: ?I was trying to put up a shower curtain in January, I fell and the back of my back real bad; I ended up with a compression fracture, also went to rehab at [facility] for 3 weeks. I always break my bending/twisting/lifting precautions I don't know why. I'm too stubborn and I'm too independent.  ? ?PERTINENT HISTORY:  ?HTN, CHF, Chiari, L1 compression fracture, frequent falls ? ?PAIN:  ?Are you having pain? No  ? ? ?PRECAUTIONS: Back and Fall, HOH  ? ?WEIGHT BEARING RESTRICTIONS No ? ?FALLS:  ?Has patient fallen in last 6 months? Yes. Number of falls 3 "and I usually hit my head"  ? ?LIVING ENVIRONMENT: ?Lives with: lives with their family and son in law is there now  ?Lives in: House/apartment ?Stairs: Yes: External: 4 steps; can reach both ?Has following equipment at home: Single point cane and Walker - 2 wheeled ? ?OCCUPATION: retired  ? ?PLOF: Independent, Independent with basic ADLs, Independent with gait, and Independent with transfers ? ?PATIENT GOALS stop hurting  ? ? ?OBJECTIVE:  ? ?DIAGNOSTIC FINDINGS:   ?FINDINGS: ?Brain: No evidence of acute infarction, hemorrhage, extra-axial ?collection or mass lesion/mass effect. Stable borderline ?ventriculomegaly. ? ?IMPRESSION: ?1. Comminuted L1 compression fracture with 44% loss of vertebral ?body height. Minimal retropulsion with no associated spinal stenosis ?or other complicating features. ?  ?2. No other acute traumatic injury identified in the lumbar spine. ?  ?3. Progressed lumbar facet arthropathy since 2015 with new grade 1 ?anterolisthesis of L4 on L5. But mild if any associated degenerative ?lumbar spinal stenosis. Stable chronic L5 foraminal stenosis. ?  ?4. CT Abdomen and Pelvis today reported separately. ?  ? ?PATIENT SURVEYS:  ?Not in FOTO, deferred at eval  ? ?SCREENING FOR RED FLAGS: ?Bowel or bladder incontinence: No ?Spinal tumors: No ?Cauda equina syndrome: No ?Compression fracture: Yes: L1 from fall in January  ?Abdominal aneurysm: No ? ?COGNITION: ? Overall cognitive status:  poor safety awareness   poor awareness of back precautions, poor STM and processing   ?  ? ? ? ? ? ? ? ?LE MMT: ? ?MMT Right ?07/30/2021 Left ?07/30/2021  ?Hip flexion 3+/5 3/5  ?Hip extension    ?Hip abduction 3+/5 4+/5  ?Hip adduction    ?Hip internal rotation    ?Hip external rotation    ?Knee flexion 3/5 3+/5  ?Knee extension 4/5 4/5  ?Ankle dorsiflexion 5/5 5/5  ?Ankle plantarflexion    ?Ankle inversion    ?Ankle eversion    ? (Blank rows = not tested) ? ? ?FUNCTIONAL TESTS:  ?5 times sit to stand: 16.71 ?Berg Balance Scale: 37 ? ? ? ? ?TODAY'S TREATMENT  ?Nustep L3 x6 minutes BLEs only  ?Bridges 1x10 ?Seated marches 1x10 ?Seated clams 1x10 ? ?Education on POC, HEP, exam findings, general safety and concerns with hitting head during falls  ? ? ?PATIENT EDUCATION:  ?Education details: see above  ?Person educated: Patient ?Education method: Explanation ?Education comprehension: verbalized understanding, returned demonstration, and needs further education ? ? ?HOME EXERCISE  PROGRAM: ?[id number]- bridges, seated marches, seated clamshells, narrow BOS  ? ?ASSESSMENT: ? ?CLINICAL IMPRESSION: ?Patient is a 66 y.o. female  who was seen today for physical therapy evaluation and treatment for rehab post fall and lumbar compression fracture. Exam reveals impaired functional activity tolerance, safety awareness, balance, and functional strength. Will benefit from skilled PT services to address identified impairments, might benefit from speech therapy moving forward for cognition as well.  ? ? ?OBJECTIVE IMPAIRMENTS decreased activity  tolerance, decreased balance, decreased cognition, decreased coordination, decreased endurance, decreased knowledge of condition, decreased knowledge of use of DME, decreased mobility, decreased strength, decreased safety awareness, postural dysfunction, obesity, and pain.  ? ?ACTIVITY LIMITATIONS community activity, laundry, yard work, shopping, and yard work.  ? ?PERSONAL FACTORS Age, Behavior pattern, Fitness, Past/current experiences, and Time since onset of injury/illness/exacerbation are also affecting patient's functional outcome.  ? ? ?REHAB POTENTIAL: Good ? ?CLINICAL DECISION MAKING: Stable/uncomplicated ? ?EVALUATION COMPLEXITY: Low ? ? ?GOALS: ?Goals reviewed with patient? No ? ?SHORT TERM GOALS: Target date: 08/27/2021    (Remove Blue Hyperlink) ? ?Will be independent with HEP  ?Baseline: ?Goal status: INITIAL ? ?2.  Will be compliant with BLT precautions until released by MD  ?Baseline:  ?Goal status: INITIAL ? ?3.  Will require no more than 2 rest breaks during PT session to show improved functional activity tolerance  ?Baseline:  ?Goal status: INITIAL ? ?4.  Will be able to name 3 ways to reduce fall risk at home and in the community  ?Baseline:  ?Goal status: INITIAL ? ? ?LONG TERM GOALS: Target date: 09/24/2021  (Remove Blue Hyperlink) ? ?MMT to improve by 1 grade in all weak groups  ?Baseline:  ?Goal status: INITIAL ? ?2.  Merrilee Jansky to be at least 12 to  show reduced fall risk  ?Baseline:  ?Goal status: INITIAL ? ?3.  Will be able to complete floor to stand transfers safely with no more than S  ?Baseline:  ?Goal status: INITIAL ? ?4.  Pain in lumbar spine to be no

## 2021-07-31 ENCOUNTER — Other Ambulatory Visit: Payer: Self-pay | Admitting: *Deleted

## 2021-07-31 ENCOUNTER — Encounter: Payer: Self-pay | Admitting: *Deleted

## 2021-07-31 DIAGNOSIS — S32000A Wedge compression fracture of unspecified lumbar vertebra, initial encounter for closed fracture: Secondary | ICD-10-CM | POA: Insufficient documentation

## 2021-07-31 DIAGNOSIS — M810 Age-related osteoporosis without current pathological fracture: Secondary | ICD-10-CM | POA: Insufficient documentation

## 2021-07-31 NOTE — Patient Outreach (Signed)
[facility]) Care Management ?Telephonic RN Care Manager Note ? ? ?07/31/2021 ?Name:  Ann Graham MRN:  [id number] DOB:  1955/06/03 ? ?Summary: ?Follow up outreach started her outpatient therapy at [place] ?Reports her legs are still weak and has seen her spine provider ?She reports she did not have her scheduled 07/18/21 0800 with Secundino Ginger at [facility] as there was a conflict related to the living arrangements with her daughter and family staying with her ?She does report knowing that this external vendor CRN is accessible and she has the ability to outreach back to [place] to reschedule as needed ? ?Recommendations/Changes made from today's visit: ?Outreach to assess for connection with her external CRN services, care coordination needs and disease management needs ?Encouraged her to re schedule her missed appointment with external CRN vendor ?Discussed [facility] services concluded with her access now available to her external CRN vendor but extended the offer for her to outreach to RN CM prn  ? ? ?Subjective: ?Ann Graham is an 66 y.o. year old female who is a primary patient of Kristen Loader, FNP. The care management team was consulted for assistance with care management and/or care coordination needs.   ? ?Telephonic RN Care Manager completed Telephone Visit today.  ? ?Objective: ? ?Medications Reviewed Today   ? ? Reviewed by Hunt Oris, PT (Physical Therapist) on 07/30/21 at [place] List Status: <None>  ? ?Medication Order Taking? Sig Documenting Provider Last Dose Status Informant  ?acetaminophen (TYLENOL) 500 MG tablet [id number] No 1 tablet as needed [provider] Taking Active   ?acetaZOLAMIDE (DIAMOX) 250 MG tablet [id number]  Take by mouth. [provider]  Active   ?albuterol (VENTOLIN HFA) 108 (90 Base) MCG/ACT inhaler [id number] No Inhale 2 puffs into the lungs every 6 (six) hours as needed for wheezing or shortness of breath. Hunsucker, Bonna Gains, MD  Past Week Active Self  ?amoxicillin-clavulanate (AUGMENTIN) 875-125 MG tablet [id number]  amoxicillin 875 mg-potassium clavulanate 125 mg tablet ? TAKE 1 TABLET BY MOUTH EVERY 12 HOURS FOR 10 DAYS [provider]  Active   ?azithromycin (ZITHROMAX) 250 MG tablet [id number]  azithromycin 250 mg tablet ? TAKE 2 TABLETS BY MOUTH ON DAY 1, AND THEN TAKE 1 TABLET BY MOUTH ONCE A DAY ON DAY 2 THROUGH DAY 5 [provider]  Active   ?azithromycin (ZITHROMAX) 250 MG tablet [id number]  Take by mouth. [provider]  Active   ?budesonide-formoterol (SYMBICORT) 160-4.5 MCG/ACT inhaler [id number] No Inhale 2 puffs into the lungs in the morning and at bedtime.  ?Patient not taking: Reported on 04/16/2021  ? Hunsucker, Bonna Gains, MD Not Taking Active Self  ?cephALEXin (KEFLEX) 500 MG capsule [id number]  cephalexin 500 mg capsule ? TAKE 1 CAPSULE BY MOUTH TWICE DAILY FOR 10 DAYS [provider]  Active   ?cetirizine (ZYRTEC ALLERGY) 10 MG tablet [id number] No Take 1 tablet (10 mg total) by mouth daily. Alfredia Client, PA-C 04/15/2021 Expired 04/16/21 2359 Self  ?cetirizine (ZYRTEC) 10 MG tablet [id number]  1 tablet [provider]  Active   ?COVID-19 At Home Test [facility] COVID-19 AG HOME TEST VI) [id number]  [employer]axNOW COVID-19 Ag Self Test kit ? Use as Directed on the Package [provider]  Active   ?diclofenac (VOLTAREN) 75 MG EC tablet [id number] No Take 75 mg by mouth 2 (two) times daily. [provider] Past Month Active Self  ?diclofenac (VOLTAREN) 75 MG EC tablet [id number]  1 tablet as needed [provider]  Active   ?Famotidine (PEPCID PO) [id number] No Take 20 mg by mouth as needed (heartburn). [provider] 04/15/2021 Active Self  ?famotidine (PEPCID) 20 MG tablet [id number]  1 tablet at bedtime as needed [provider]  Active   ?fluconazole (DIFLUCAN) 150 MG tablet [id number]  fluconazole 150 mg tablet ? TAKE 1 TABLET BY MOUTH ONCE DAILY  FOR 1 DAY [provider]  Active   ?fluticasone (CUTIVATE) 0.05 % cream [id number]  fluticasone propionate 0.05 % topical cream ? APPLY TO FACE TWICE DAILY FOR IRRITATION [provider]  Active   ?furosemide (LASIX) 20 MG tablet [id number] No Take 20 mg by mouth daily. Oswald Hillock, MD Taking Active Self  ?         ?Med Note Tory Emerald Apr 16, 2021  4:27 PM)    ?nirmatrelvir & ritonavir (PAXLOVID, 300/100,) 20 x 150 MG & 10 x 100MG TBPK [id number]  Paxlovid 300 mg (150 mg x 2)-100 mg tablets in a dose pack (EUA)  TAKE AS DIRECTED TWICE DAILY FOR 5 DAYS [provider]  Active   ?oxyCODONE-acetaminophen (PERCOCET/ROXICET) 5-325 MG tablet [id number] No Take 1 tablet by mouth every 6 (six) hours as needed for moderate pain.  ?Patient not taking: Reported on 05/15/2021  ? Caren Griffins, MD Not Taking Active   ?Potassium Chloride ER 20 MEQ TBCR [id number] No Take 1 tablet by mouth daily. [provider] Past Week Active Self  ?rosuvastatin (CRESTOR) 10 MG tablet [id number] No Take 10 mg by mouth daily. [provider] Past Week Active Self  ? ?  ?  ? ?  ? ? ? ?SDOH:  (Social Determinants of Health) assessments and interventions performed:  ? ? ?Care Plan ? ?Review of patient past medical history, allergies, medications, health status, including review of consultants reports, laboratory and other test data, was performed as part of comprehensive evaluation for care management services.  ? ?Care Plan : RN Care Manager Plan of Care  ?Updates made by Barbaraann Faster, RN since 07/31/2021 12:00 AM  ?Completed 07/31/2021  ? ?Problem: Complex Care Coordination Needs and disease management in patient with compression fracture, HTN, CHF Resolved 07/31/2021  ?Priority: High  ?Onset Date: 05/10/2021  ?  ? ?Long-Range Goal: Establish Plan of Care for Management Complex SDOH Barriers, disease management and Care Coordination Needs in patient with compression fracture, HTN, CHF  Completed 07/31/2021  ?Start Date: 05/10/2021  ?This Visit's Progress: On track  ?Recent Progress: On track  ?Priority: High  ?Note:   ?Current Barriers:  ?Knowledge Deficits related to plan of care for management of CHF, HTN, and compression fracture  ?Care Coordination needs related to Limited education about home care for CHF, HTN, compression fracture* ?Barriers: knowledge ?Barriers: home construction for improvements for her, one of her daughters and her family & large dog have moved in with her, no covid vaccines ? 1/31-04/18/21 admission for closed compression fracture of body of L1 vertebra -[facility] skilled nursing 04/18/21- 05/08/21 stay ? ?RN CM Clinical Goal(s):  ?Patient will verbalize understanding of plan for management of CHF, HTN, and compression fracture as evidenced by decrease risk of re admission  through collaboration with RN Care manager, provider, and care team.  ?Goal met 07/31/21 Pt with education on CHF, HTN, Fracture- now attending outpatient therapies and has access to external vendor CRN  ? ?Interventions: ?Outreaches for care coordination, disease management, resources, home care/education needs ?  Inter-disciplinary care team collaboration (see longitudinal plan of care) ?Evaluation of current treatment plan related to  self management and patient's adherence to plan as established by provider ?05/15/21 Discussed the differences in home health in person and [facility] telephonic visits  ?Outreach to [employer] [phone]. Spoke with [facility] who confirms patient does not have another home visit scheduled until Thursday May 16 2021 with [facility] OT  ?Updated patient on response from [place] ?Reviewed covid precautions, encouraged following precautions - masks ?[facility] discussed - not preferred ?Reviewed [facility] progression-next outreach  ?06/13/21 Referral to external CM vendor via e-mail. Encouraged her to access her pcp portal for possible pharmacy/CRN external services and update RN CM ? ?Heart Failure  Interventions:  (Status:  Goal Met.) Long Term Goal 07/31/21 denies swelling Has scales, denies weight increases ?Assessed need for readable accurate scales in home ?Discussed importance of daily weight and adv

## 2021-08-15 ENCOUNTER — Ambulatory Visit: Payer: Medicare Other | Attending: Neurosurgery | Admitting: Physical Therapy

## 2021-08-15 DIAGNOSIS — R293 Abnormal posture: Secondary | ICD-10-CM | POA: Diagnosis present

## 2021-08-15 DIAGNOSIS — M6281 Muscle weakness (generalized): Secondary | ICD-10-CM | POA: Insufficient documentation

## 2021-08-15 DIAGNOSIS — R296 Repeated falls: Secondary | ICD-10-CM | POA: Diagnosis present

## 2021-08-15 DIAGNOSIS — M5459 Other low back pain: Secondary | ICD-10-CM | POA: Diagnosis present

## 2021-08-15 DIAGNOSIS — R2681 Unsteadiness on feet: Secondary | ICD-10-CM | POA: Insufficient documentation

## 2021-08-15 NOTE — Therapy (Signed)
OUTPATIENT PHYSICAL THERAPY THORACOLUMBAR EVALUATION   Patient Name: Ann Graham MRN: [id number] DOB:10-21-1955, 66 y.o., female Today's Date: 08/15/2021   PT End of Session - 08/15/21 1056     Visit Number 2    Number of Visits 17    Date for PT Re-Evaluation 09/24/21    Authorization Type MCR and Aetna    Authorization Time Period 07/27/21 to 09/24/21    PT Start Time 1100    PT Stop Time 1145    PT Time Calculation (min) 45 min             Past Medical History:  Diagnosis Date   Anxiety    Budd-Chiari syndrome (HCC)    Elevated cholesterol    Hx of cardiovascular stress test    Lexiscan Myoview 4/14:  No ischemia, EF 75%.   Hx of echocardiogram    Echo 3/14: EF 55-60%, Gr 1 DD, mild LAE   Hypertension    Past Surgical History:  Procedure Laterality Date   DILATION AND CURETTAGE OF UTERUS     Patient Active Problem List   Diagnosis Date Noted   Age-related osteoporosis without current pathological fracture 07/31/2021   Wedge compression fracture of unspecified lumbar vertebra, initial encounter for closed fracture ([place]) 07/31/2021   Allergic rhinitis 05/10/2021   Edema 05/10/2021   Family history of malignant neoplasm of gastrointestinal tract 05/10/2021   Generalized anxiety disorder 05/10/2021   History of adenomatous polyp of colon 05/10/2021   History of diverticulitis 05/10/2021   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[place]) 05/10/2021   Urinary incontinence 05/10/2021   UTI (urinary tract infection) 04/18/2021   OSA (obstructive sleep apnea) 04/18/2021   Chronic diastolic CHF (congestive heart failure) ([place]) 04/17/2021   Closed compression fracture of body of L1 vertebra ([place]) 04/17/2021   Compression fracture of L1 lumbar vertebra ([place]) 04/16/2021   Frequent falls 04/16/2021   Morbid obesity ([place]) 04/16/2021   Hypocalcemia 04/16/2021   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   Pain in right hand 03/28/2021   Retention of urine 80/16/5537   Diastolic  dysfunction 48/27/0786   Chest pain at rest 06/13/2012   Hypokalemia 06/13/2012   Cardiomegaly 06/13/2012   Idiopathic intracranial hypertension 09/03/2011   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   Empty sella ([place]) 02/20/2011   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 Very fwd flexed and shuffled gait attimes  PERTINENT HISTORY:  HTN, CHF, Chiari, L1 compression fracture, frequent falls  PAIN:  Are you having pain? No    PRECAUTIONS: Back and Fall, HOH   WEIGHT BEARING RESTRICTIONS No  FALLS:  Has patient fallen in last 6 months? Yes. Number of falls 3 "and I usually hit my head"    PLOF: Independent, Independent with basic ADLs, Independent with gait, and Independent with transfers  PATIENT GOALS stop hurting    OBJECTIVE:          LE MMT:  MMT Right 08/15/2021 Left 08/15/2021  Hip flexion 3+/5 3/5  Hip extension    Hip abduction 3+/5 4+/5  Hip adduction    Hip internal rotation    Hip external rotation    Knee flexion 3/5 3+/5  Knee extension 4/5 4/5  Ankle dorsiflexion 5/5 5/5  Ankle plantarflexion    Ankle inversion    Ankle eversion     (Blank rows = not tested)   FUNCTIONAL TESTS:  5 times sit to stand: 16.71 Berg Balance Scale: 37    08/15/21 UBE L 2 1.5  min fwd/1.5 min back Red tband standing shld ext and row 2 sets 10 Red tband hip ext and abd 10 each Nustep L 3 5 min UE/LE STS 10 x with wt ball chest press LAQ and hip flex 3# 2 sets 10 Ball squeeze 15x Green tband clams 15x Resited gait 20# 5 x fwd/back and 3 x each way Trunk ext seated green tband 15x 2# OH trunk ext 10 x Reviewed BLTS and BM    TODAY'S TREATMENT -EVAL Nustep L3 x6 minutes BLEs only  Bridges 1x10 Seated marches 1x10 Seated clams 1x10  Education on POC, HEP, exam findings, general safety and concerns with hitting head during falls    PATIENT EDUCATION:  Education details: BLT Person educated: Patient Education method: Explanation Education comprehension: verbalized understanding, returned demonstration, and needs further education   HOME EXERCISE PROGRAM: [id number]- bridges, seated marches, seated clamshells, narrow BOS   ASSESSMENT:  CLINICAL IMPRESSION: Tolerated initial ex progression well other than LE fatigue RT> Left. Needed postural cuing with ex as pt is very fwd flexed. Also tends to shuffle feet.  OBJECTIVE IMPAIRMENTS decreased activity tolerance, decreased balance, decreased cognition, decreased coordination, decreased endurance, decreased knowledge of condition, decreased knowledge of use of DME, decreased mobility, decreased strength, decreased safety awareness, postural dysfunction, obesity, and pain.   ACTIVITY LIMITATIONS community activity, laundry, yard work, shopping, and yard work.   PERSONAL FACTORS Age, Behavior pattern, Fitness, Past/current experiences, and Time since onset of injury/illness/exacerbation are also affecting patient's functional outcome.    REHAB POTENTIAL: Good  CLINICAL DECISION MAKING: Stable/uncomplicated  EVALUATION COMPLEXITY: Low   GOALS: Goals reviewed with patient? No  SHORT TERM GOALS: Target date: 08/27/2021    (Remove Blue Hyperlink)  Will be independent with HEP  Baseline: Goal status:  INITIAL  2.  Will be compliant with BLT precautions until released by MD  Baseline:  Goal status: INITIAL  3.  Will require no more than 2 rest breaks during PT session to show improved functional activity tolerance  Baseline:  Goal status: INITIAL  4.  Will be able to name 3 ways to reduce fall risk at home and in the community  Baseline:  Goal status: INITIAL   LONG TERM GOALS: Target date: 09/24/2021  (Remove Blue Hyperlink)  MMT to improve by 1 grade in all  weak groups  Baseline:  Goal status: INITIAL  2.  Berg to be at least 97 to show reduced fall risk  Baseline:  Goal status: INITIAL  3.  Will be able to complete floor to stand transfers safely with no more than S  Baseline:  Goal status: INITIAL  4.  Pain in lumbar spine to be no more than 1/10 at worst with functional task performance  Baseline:  Goal status: INITIAL    PLAN: PT FREQUENCY: 2x/week  PT DURATION: 8 weeks  PLANNED INTERVENTIONS: Therapeutic exercises, Therapeutic activity, Neuromuscular re-education, Balance training, Gait training, Patient/Family education, Joint mobilization, Stair training, DME instructions, Cognitive remediation, Cryotherapy, Moist heat, Ultrasound, Ionotophoresis '4mg'$ /ml Dexamethasone, Manual therapy, and Re-evaluation.  PLAN FOR NEXT SESSION: Strength, balance, functional activity tolerance, review BLT precautions INCREASE HEP   Levada Dy Xoie Kreuser PTA

## 2021-08-19 ENCOUNTER — Ambulatory Visit: Payer: Medicare Other | Admitting: Physical Therapy

## 2021-08-19 ENCOUNTER — Encounter: Payer: Self-pay | Admitting: Physical Therapy

## 2021-08-19 DIAGNOSIS — M6281 Muscle weakness (generalized): Secondary | ICD-10-CM | POA: Diagnosis not present

## 2021-08-19 DIAGNOSIS — R293 Abnormal posture: Secondary | ICD-10-CM

## 2021-08-19 DIAGNOSIS — M5459 Other low back pain: Secondary | ICD-10-CM

## 2021-08-19 DIAGNOSIS — R2681 Unsteadiness on feet: Secondary | ICD-10-CM

## 2021-08-19 NOTE — Therapy (Signed)
OUTPATIENT PHYSICAL THERAPY THORACOLUMBAR EVALUATION   Patient Name: Ann Graham MRN: [id number] DOB:07-Jun-1955, 66 y.o., female Today's Date: 08/19/2021   PT End of Session - 08/19/21 1001     Visit Number 3    Number of Visits 17    Date for PT Re-Evaluation 09/24/21    Authorization Type MCR and Aetna    Authorization Time Period 07/27/21 to 09/24/21    Progress Note Due on Visit 10    PT Start Time 0932    PT Stop Time 1011    PT Time Calculation (min) 39 min    Activity Tolerance Patient tolerated treatment well    Behavior During Therapy [facility] for tasks assessed/performed              Past Medical History:  Diagnosis Date   Anxiety    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   Hx of echocardiogram    Echo 3/14: EF 55-60%, Gr 1 DD, mild LAE   Hypertension    Past Surgical History:  Procedure Laterality Date   DILATION AND CURETTAGE OF UTERUS     Patient Active Problem List   Diagnosis Date Noted   Age-related osteoporosis without current pathological fracture 07/31/2021   Wedge compression fracture of unspecified lumbar vertebra, initial encounter for closed fracture ([place]) 07/31/2021   Allergic rhinitis 05/10/2021   Edema 05/10/2021   Family history of malignant neoplasm of gastrointestinal tract 05/10/2021   Generalized anxiety disorder 05/10/2021   History of adenomatous polyp of colon 05/10/2021   History of diverticulitis 05/10/2021   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[place]) 05/10/2021   Urinary incontinence 05/10/2021   UTI (urinary tract infection) 04/18/2021   OSA (obstructive sleep apnea) 04/18/2021   Chronic diastolic CHF (congestive heart failure) ([place]) 04/17/2021   Closed compression fracture of body of L1 vertebra ([place]) 04/17/2021   Compression fracture of L1 lumbar vertebra ([place]) 04/16/2021   Frequent falls 04/16/2021   Morbid obesity ([place])  04/16/2021   Hypocalcemia 04/16/2021   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   Pain in right hand 03/28/2021   Retention of urine 72/11/4707   Diastolic dysfunction 62/83/6629   Chest pain at rest 06/13/2012   Hypokalemia 06/13/2012   Cardiomegaly 06/13/2012   Idiopathic intracranial hypertension 09/03/2011   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   Empty sella ([place]) 02/20/2011   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[place]) 02/10/2011   Papilledema 12/20/2010   Hypercholesterolemia 08/12/2008   OBESITY 08/12/2008   HYPERTENSION 08/12/2008    PCP: Jenny Reichmann   REFERRING PROVIDER: Consuella Lose   REFERRING DIAG: S32.010D (ICD-10-CM) - Wedge compression fracture of first lumbar vertebra, subsequent encounter for fracture with routine healing   THERAPY DIAG:  Muscle weakness (generalized)  Unsteadiness on feet  Other low back pain  Abnormal posture  ONSET DATE: 07/15/2021   SUBJECTIVE:  SUBJECTIVE STATEMENT: I'm doing OK frustrated just dealing with whole process of rehab in the SNF and now family living with me. Sore after last session.   PERTINENT HISTORY:  HTN, CHF, Chiari, L1 compression fracture, frequent falls  PAIN:  Are you having pain? No    PRECAUTIONS: Back and Fall, HOH   WEIGHT BEARING RESTRICTIONS No  FALLS:  Has patient fallen in last 6 months? Yes. Number of falls 3 "and I usually hit my head"    PLOF: Independent, Independent with basic ADLs, Independent with gait, and Independent with transfers  PATIENT GOALS stop hurting    TREATMENT     08/15/21 UBE L 2 1.5 min fwd/1.5 min back Red tband standing shld ext and row 2 sets 10 Red tband hip ext and abd 10 each Nustep L 3 5 min UE/LE STS 10 x with wt ball chest press LAQ and hip flex 3# 2 sets 10 Ball squeeze 15x Green tband  clams 15x Resited gait 20# 5 x fwd/back and 3 x each way Trunk ext seated green tband 15x 2# OH trunk ext 10 x Reviewed BLTS and BM  08/19/21  Practiced bed mobility in and out of bed min-mod cues for BLT form   Supine bridges 1x15 Supine clams red TB 1x15  Supine bridge + clam red TB 1x15  Nustep L3 BLEs only   Forward step ups 4 inch box x10 B U UE support  Lateral step ups 4 inch box x10 B B UE support  Forward lunges on TM 1x10 B BUE support  Sit to stands x10 6# no UEs  Farmers carry with 6# x267f  Semi tandem stance 3x30 seconds B        PATIENT EDUCATION:  Education details: BLT Person educated: Patient Education method: Explanation Education comprehension: verbalized understanding, returned demonstration, and needs further education   HOME EXERCISE PROGRAM: [id number]- bridges, seated marches, seated clamshells, narrow BOS   ASSESSMENT:  CLINICAL IMPRESSION:  JDravenarrives today doing OK. Still needs cues for BLT precautions but recalled more and did better with them today. Continued to focus on general strength, balance, and functional activity tolerance. Slightly distractable today and needed some   OBJECTIVE IMPAIRMENTS decreased activity tolerance, decreased balance, decreased cognition, decreased coordination, decreased endurance, decreased knowledge of condition, decreased knowledge of use of DME, decreased mobility, decreased strength, decreased safety awareness, postural dysfunction, obesity, and pain.   ACTIVITY LIMITATIONS community activity, laundry, yard work, shopping, and yard work.   PERSONAL FACTORS Age, Behavior pattern, Fitness, Past/current experiences, and Time since onset of injury/illness/exacerbation are also affecting patient's functional outcome.    REHAB POTENTIAL: Good  CLINICAL DECISION MAKING: Stable/uncomplicated  EVALUATION COMPLEXITY: Low   GOALS: Goals reviewed with patient? No  SHORT TERM GOALS: Target date: 08/27/2021     (Remove Blue Hyperlink)  Will be independent with HEP  Baseline: Goal status: INITIAL  2.  Will be compliant with BLT precautions until released by MD  Baseline:  Goal status: INITIAL  3.  Will require no more than 2 rest breaks during PT session to show improved functional activity tolerance  Baseline:  Goal status: INITIAL  4.  Will be able to name 3 ways to reduce fall risk at home and in the community  Baseline:  Goal status: INITIAL   LONG TERM GOALS: Target date: 09/24/2021  (Remove Blue Hyperlink)  MMT to improve by 1 grade in all weak groups  Baseline:  Goal status: INITIAL  2.  Berg to  be at least 47 to show reduced fall risk  Baseline:  Goal status: INITIAL  3.  Will be able to complete floor to stand transfers safely with no more than S  Baseline:  Goal status: INITIAL  4.  Pain in lumbar spine to be no more than 1/10 at worst with functional task performance  Baseline:  Goal status: INITIAL    PLAN: PT FREQUENCY: 2x/week  PT DURATION: 8 weeks  PLANNED INTERVENTIONS: Therapeutic exercises, Therapeutic activity, Neuromuscular re-education, Balance training, Gait training, Patient/Family education, Joint mobilization, Stair training, DME instructions, Cognitive remediation, Cryotherapy, Moist heat, Ultrasound, Ionotophoresis '4mg'$ /ml Dexamethasone, Manual therapy, and Re-evaluation.  PLAN FOR NEXT SESSION: Strength, balance, functional activity tolerance, review BLT precautions INCREASE HEP    Ingeborg Fite U PT DPT PN2  08/19/2021, 10:12 AM

## 2021-08-22 ENCOUNTER — Ambulatory Visit: Payer: Medicare Other | Admitting: Physical Therapy

## 2021-08-22 DIAGNOSIS — M6281 Muscle weakness (generalized): Secondary | ICD-10-CM

## 2021-08-22 DIAGNOSIS — R2681 Unsteadiness on feet: Secondary | ICD-10-CM

## 2021-08-22 DIAGNOSIS — M5459 Other low back pain: Secondary | ICD-10-CM

## 2021-08-22 DIAGNOSIS — R293 Abnormal posture: Secondary | ICD-10-CM

## 2021-08-22 NOTE — Therapy (Signed)
OUTPATIENT PHYSICAL THERAPY THORACOLUMBAR EVALUATION   Patient Name: Ann Graham MRN: [id number] DOB:Jan 27, 1956, 65 y.o., female Today's Date: 08/22/2021   PT End of Session - 08/22/21 1513     Visit Number 4    Number of Visits 17    Date for PT Re-Evaluation 09/24/21    Authorization Type MCR and Aetna    Authorization Time Period 07/27/21 to 09/24/21    PT Start Time 1515    PT Stop Time 1600    PT Time Calculation (min) 45 min              Past Medical History:  Diagnosis Date   Anxiety    Budd-Chiari syndrome ([facility])    Elevated cholesterol    Hx of cardiovascular stress test    Lexiscan Myoview 4/14:  No ischemia, EF 75%.   Hx of echocardiogram    Echo 3/14: EF 55-60%, Gr 1 DD, mild LAE   Hypertension    Past Surgical History:  Procedure Laterality Date   DILATION AND CURETTAGE OF UTERUS     Patient Active Problem List   Diagnosis Date Noted   Age-related osteoporosis without current pathological fracture 07/31/2021   Wedge compression fracture of unspecified lumbar vertebra, initial encounter for closed fracture ([place]) 07/31/2021   Allergic rhinitis 05/10/2021   Edema 05/10/2021   Family history of malignant neoplasm of gastrointestinal tract 05/10/2021   Generalized anxiety disorder 05/10/2021   History of adenomatous polyp of colon 05/10/2021   History of diverticulitis 05/10/2021   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[place]) 05/10/2021   Urinary incontinence 05/10/2021   UTI (urinary tract infection) 04/18/2021   OSA (obstructive sleep apnea) 04/18/2021   Chronic diastolic CHF (congestive heart failure) ([place]) 04/17/2021   Closed compression fracture of body of L1 vertebra ([place]) 04/17/2021   Compression fracture of L1 lumbar vertebra ([place]) 04/16/2021   Frequent falls 04/16/2021   Morbid obesity ([place]) 04/16/2021   Hypocalcemia 04/16/2021   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   Pain in right hand 03/28/2021   Retention of urine 87/86/7672   Diastolic  dysfunction 09/47/0962   Chest pain at rest 06/13/2012   Hypokalemia 06/13/2012   Cardiomegaly 06/13/2012   Idiopathic intracranial hypertension 09/03/2011   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   Empty sella ([place]) 02/20/2011   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 Some issues with LE cramping PERTINENT HISTORY:  HTN, CHF, Chiari, L1 compression fracture, frequent falls  PAIN:  Are you having pain? no   PRECAUTIONS: Back and Fall, HOH   WEIGHT BEARING RESTRICTIONS No  FALLS:  Has patient fallen in last 6 months? Yes. Number of falls 3 "and I usually hit my head"    PLOF: Independent, Independent with basic ADLs, Independent with gait, and Independent with transfers  PATIENT GOALS stop hurting    TREATMENT     08/15/21 UBE L 2 1.5 min fwd/1.5 min back Red tband standing shld ext and row 2 sets 10 Red tband hip ext and abd 10 each Nustep L 3 5 min UE/LE STS 10 x with wt ball chest press LAQ and hip flex 3# 2 sets 10 Ball squeeze 15x Green tband clams 15x Resited gait 20# 5 x fwd/back and 3 x each way Trunk ext seated green tband 15x 2# OH trunk ext 10 x Reviewed BLTS and BM  08/19/21  Practiced bed mobility in  and out of bed min-mod cues for BLT form   Supine bridges 1x15 Supine clams red TB 1x15  Supine bridge + clam red TB 1x15  Nustep L3 BLEs only   Forward step ups 4 inch box x10 B U UE support  Lateral step ups 4 inch box x10 B B UE support  Forward lunges on TM 1x10 B BUE support  Sit to stands x10 6# no UEs  Farmers carry with 6# x285f  Semi tandem stance 3x30 seconds B   08/22/21 Nustep L 4 6 min Resisted gait 30# 5 x fwd ,5 x backward, 3x each side Black tband trunk ext 2 sets 10 Seated row 20 # and Lat Pull 20# 2 sets 10 Cable pulleys 10# 2 sets 10 STS with wt ball OH press 10 x 6# farmers carry 3# stick flex,chest press and ext 10 each       PATIENT EDUCATION:  Education details: BLT on going with tx and actvity Person educated: Patient Education method: Explanation Education comprehension: verbalized understanding, returned demonstration, and needs further education   HOME EXERCISE PROGRAM: [id number]- bridges, seated marches, seated clamshells, narrow BOS   ASSESSMENT:  CLINICAL IMPRESSION: progressed actvity today with focus on strength with upright posture- cuing needed  OBJECTIVE IMPAIRMENTS decreased activity tolerance, decreased balance, decreased cognition, decreased coordination, decreased endurance, decreased knowledge of condition, decreased knowledge of use of DME, decreased mobility, decreased strength, decreased safety awareness, postural dysfunction, obesity, and pain.   ACTIVITY LIMITATIONS community activity, laundry, yard work, shopping, and yard work.   PERSONAL FACTORS Age, Behavior pattern, Fitness, Past/current experiences, and Time since onset of injury/illness/exacerbation are also affecting patient's functional outcome.    REHAB POTENTIAL: Good  CLINICAL DECISION MAKING: Stable/uncomplicated    GOALS: Goals reviewed with patient? No  SHORT TERM GOALS: Target date: 08/27/2021    (Remove Blue Hyperlink)  Will be independent with  HEP  Baseline: Goal status: met  2.  Will be compliant with BLT precautions until released by MD  Baseline:  Goal status: progressing  3.  Will require no more than 2 rest breaks during PT session to show improved functional activity tolerance  Baseline:  Goal status: on going  4.  Will be able to name 3 ways to reduce fall risk at home and in the community  Baseline:  Goal status: partially met  LONG TERM GOALS: Target date: 09/24/2021  (Remove Blue Hyperlink)  MMT to improve by 1 grade in all weak groups  Baseline:  Goal status: INITIAL  2.  Berg to be at least 50 to show reduced fall risk  Baseline:  Goal status: INITIAL  3.  Will be able to complete floor to stand transfers safely with no more than S  Baseline:  Goal status: INITIAL  4.  Pain in lumbar spine to be no more than 1/10 at worst with functional task performance  Baseline:  Goal status: INITIAL    PLAN: PT FREQUENCY: 2x/week  PT DURATION: 8 weeks  PLANNED INTERVENTIONS: Therapeutic exercises, Therapeutic activity, Neuromuscular re-education, Balance training, Gait training, Patient/Family education, Joint mobilization, Stair training, DME instructions, Cognitive remediation, Cryotherapy, Moist heat, Ultrasound, Ionotophoresis 67m/ml Dexamethasone, Manual therapy, and Re-evaluation.  PLAN FOR NEXT SESSION: Strength, balance, functional activity tolerance, review BLT precautions INCREASE HEP    Deaja Rizo PTA 08/22/2021, 3:17 PM

## 2021-08-26 ENCOUNTER — Ambulatory Visit: Payer: Medicare Other | Admitting: Physical Therapy

## 2021-08-29 ENCOUNTER — Ambulatory Visit: Payer: Medicare Other | Admitting: Physical Therapy

## 2021-08-29 DIAGNOSIS — M6281 Muscle weakness (generalized): Secondary | ICD-10-CM | POA: Diagnosis not present

## 2021-08-29 DIAGNOSIS — R2681 Unsteadiness on feet: Secondary | ICD-10-CM

## 2021-08-29 DIAGNOSIS — M5459 Other low back pain: Secondary | ICD-10-CM

## 2021-08-29 NOTE — Therapy (Signed)
OUTPATIENT PHYSICAL THERAPY THORACOLUMBAR   Patient Name: Ann Graham MRN: [id number] DOB:[birth year], 66 y.o., female Today's Date: 08/29/2021   PT End of Session - 08/29/21 1523     Visit Number 5    Number of Visits 17    Date for PT Re-Evaluation 09/24/21    Authorization Type MCR and Aetna    Authorization Time Period 07/27/21 to 09/24/21    PT Start Time 1525    PT Stop Time 1610    PT Time Calculation (min) 45 min              Past Medical History:  Diagnosis Date   Anxiety    Budd-Chiari syndrome ([facility])    Elevated cholesterol    Hx of cardiovascular stress test    Lexiscan Myoview 4/14:  No ischemia, EF 75%.   Hx of echocardiogram    Echo 3/14: EF 55-60%, Gr 1 DD, mild LAE   Hypertension    Past Surgical History:  Procedure Laterality Date   DILATION AND CURETTAGE OF UTERUS     Patient Active Problem List   Diagnosis Date Noted   Age-related osteoporosis without current pathological fracture 07/31/2021   Wedge compression fracture of unspecified lumbar vertebra, initial encounter for closed fracture ([place]) 07/31/2021   Allergic rhinitis 05/10/2021   Edema 05/10/2021   Family history of malignant neoplasm of gastrointestinal tract 05/10/2021   Generalized anxiety disorder 05/10/2021   History of adenomatous polyp of colon 05/10/2021   History of diverticulitis 05/10/2021   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[place]) 05/10/2021   Urinary incontinence 05/10/2021   UTI (urinary tract infection) 04/18/2021   OSA (obstructive sleep apnea) 04/18/2021   Chronic diastolic CHF (congestive heart failure) ([place]) 04/17/2021   Closed compression fracture of body of L1 vertebra ([place]) 04/17/2021   Compression fracture of L1 lumbar vertebra ([place]) 04/16/2021   Frequent falls 04/16/2021   Morbid obesity ([place]) 04/16/2021   Hypocalcemia 04/16/2021   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   Pain in right hand 03/28/2021   Retention of urine 94/85/4627   Diastolic  dysfunction 03/50/0938   Chest pain at rest 06/13/2012   Hypokalemia 06/13/2012   Cardiomegaly 06/13/2012   Idiopathic intracranial hypertension 09/03/2011   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   Empty sella ([place]) 02/20/2011   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  ONSET DATE: 07/15/2021   SUBJECTIVE:  SUBJECTIVE STATEMENT: sore and pain, still stressful at home. Trying to remember BLTs with doing stuff at home  PERTINENT HISTORY:  HTN, CHF, Chiari, L1 compression fracture, frequent falls  PAIN:  Are you having pain? no   PRECAUTIONS: Back and Fall, HOH   WEIGHT BEARING RESTRICTIONS No  FALLS:  Has patient fallen in last 6 months? Yes. Number of falls 3 "and I usually hit my head"    PLOF: Independent, Independent with basic ADLs, Independent with gait, and Independent with transfers  PATIENT GOALS stop hurting    TREATMENT  08/29/21 Nustep L 6 min STS 30 sec without UE 8x TUG without AD  12 sec Tandem gait fwd and back on foam beam in // bars with UE  3x Side stepping on foam beam in // bars with minimal UE  3 X STS on airex with wt ball press 2 sets 5 Resisted gait 20# 5 x fwd, 5 x back, 3 x each side- cued to increase stride and keep upright posture Cable pulley row and shld ext 15 x each  Wt ball trunk ext 10 x Knee ext 5# 2 sets 10 HS curl 10# 2 sets 5      08/15/21 UBE L 2 1.5 min fwd/1.5 min back Red tband standing shld  ext and row 2 sets 10 Red tband hip ext and abd 10 each Nustep L 3 5 min UE/LE STS 10 x with wt ball chest press LAQ and hip flex 3# 2 sets 10 Ball squeeze 15x Green tband clams 15x Resited gait 20# 5 x fwd/back and 3 x each way Trunk ext seated green tband 15x 2# OH trunk ext 10 x Reviewed BLTS and BM  08/19/21  Practiced bed mobility in and out of bed min-mod cues for BLT form   Supine bridges 1x15 Supine clams red TB 1x15  Supine bridge + clam red TB 1x15  Nustep L3 BLEs only   Forward step ups 4 inch box x10 B U UE support  Lateral step ups 4 inch box x10 B B UE support  Forward lunges on TM 1x10 B BUE support  Sit to stands x10 6# no UEs  Farmers carry with 6# x266f  Semi tandem stance 3x30 seconds B   08/22/21 Nustep L 4 6 min Resisted gait 30# 5 x fwd ,5 x backward, 3x each side Black tband trunk ext 2 sets 10 Seated row 20 # and Lat Pull 20# 2 sets 10 Cable pulleys 10# 2 sets 10 STS with wt ball OH press 10 x 6# farmers carry 3# stick flex,chest press and ext 10 each       PATIENT EDUCATION:  Education details: BLT on going with tx and actvity Person educated: Patient Education method: Explanation Education comprehension: verbalized understanding, returned demonstration, and needs further education   HOME EXERCISE PROGRAM: [id number]- bridges, seated marches, seated clamshells, narrow BOS   ASSESSMENT:  CLINICAL IMPRESSION:   progressed ex for strength and balance. Pt does need postural cues to stand upright as she tends to lean fwd in fwd flex and some pain moving into trunk ext.Pt fearful with dynamic balance ex. Progressing with goals  OBJECTIVE IMPAIRMENTS decreased activity tolerance, decreased balance, decreased cognition, decreased coordination, decreased endurance, decreased knowledge of condition, decreased knowledge of use of DME, decreased mobility, decreased strength, decreased safety awareness, postural dysfunction, obesity, and pain.    ACTIVITY LIMITATIONS community activity, laundry, yard work, shopping, and yard work.   PERSONAL FACTORS Age, Behavior pattern, Fitness,  Past/current experiences, and Time since onset of injury/illness/exacerbation are also affecting patient's functional outcome.    REHAB POTENTIAL: Good  CLINICAL DECISION MAKING: Stable/uncomplicated    GOALS: Goals reviewed with patient? No  SHORT TERM GOALS: Target date: 08/27/2021    (Remove Blue Hyperlink)  Will be independent with HEP  Baseline: Goal status: met  2.  Will be compliant with BLT precautions until released by MD  Baseline:  Goal status: met 3.  Will require no more than 2 rest breaks during PT session to show improved functional activity tolerance  Baseline:  Goal status: on going  4.  Will be able to name 3 ways to reduce fall risk at home and in the community  Baseline:  Goal status: met  LONG TERM GOALS: Target date: 09/24/2021  (Remove Blue Hyperlink)  MMT to improve by 1 grade in all weak groups  Baseline:  Goal status: INITIAL  2.  Berg to be at least 35 to show reduced fall risk  Baseline:  Goal status: INITIAL  3.  Will be able to complete floor to stand transfers safely with no more than S  Baseline:  Goal status: INITIAL  4.  Pain in lumbar spine to be no more than 1/10 at worst with functional task performance  Baseline:  Goal status: INITIAL    PLAN: PT FREQUENCY: 2x/week  PT DURATION: 8 weeks  PLANNED INTERVENTIONS: Therapeutic exercises, Therapeutic activity, Neuromuscular re-education, Balance training, Gait training, Patient/Family education, Joint mobilization, Stair training, DME instructions, Cognitive remediation, Cryotherapy, Moist heat, Ultrasound, Ionotophoresis 65m/ml Dexamethasone, Manual therapy, and Re-evaluation.  PLAN FOR NEXT SESSION: Strength, balance, functional activity tolerance, review BLT precautions INCREASE HEP    Bellamy Judson PTA 08/29/2021, 3:24 PM

## 2021-09-02 ENCOUNTER — Ambulatory Visit: Payer: Medicare Other | Admitting: Physical Therapy

## 2021-09-02 ENCOUNTER — Encounter: Payer: Self-pay | Admitting: Physical Therapy

## 2021-09-02 DIAGNOSIS — M6281 Muscle weakness (generalized): Secondary | ICD-10-CM | POA: Diagnosis not present

## 2021-09-02 DIAGNOSIS — R293 Abnormal posture: Secondary | ICD-10-CM

## 2021-09-02 DIAGNOSIS — R2681 Unsteadiness on feet: Secondary | ICD-10-CM

## 2021-09-02 DIAGNOSIS — M5459 Other low back pain: Secondary | ICD-10-CM

## 2021-09-02 DIAGNOSIS — R296 Repeated falls: Secondary | ICD-10-CM

## 2021-09-02 NOTE — Therapy (Signed)
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   Hx of echocardiogram    Echo 3/14: EF 55-60%, Gr 1 DD, mild LAE   Hypertension    Past Surgical History:  Procedure Laterality Date   DILATION AND CURETTAGE OF UTERUS     Patient Active Problem List   Diagnosis Date Noted   Age-related osteoporosis without current pathological fracture 07/31/2021   Wedge compression fracture of unspecified lumbar vertebra, initial encounter for closed fracture ([place]) 07/31/2021   Allergic rhinitis 05/10/2021   Edema 05/10/2021   Family history of malignant neoplasm of gastrointestinal tract 05/10/2021   Generalized anxiety disorder 05/10/2021   History of adenomatous polyp of colon 05/10/2021   History of diverticulitis 05/10/2021   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[place]) 05/10/2021   Urinary incontinence 05/10/2021   UTI (urinary tract infection) 04/18/2021   OSA (obstructive sleep apnea) 04/18/2021   Chronic diastolic CHF (congestive heart failure) ([place]) 04/17/2021   Closed compression fracture of body of L1 vertebra ([place]) 04/17/2021   Compression fracture of L1 lumbar vertebra ([place]) 04/16/2021   Frequent falls 04/16/2021   Morbid obesity ([place]) 04/16/2021   Hypocalcemia 04/16/2021   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   Pain in right hand 03/28/2021   Retention of urine 40/98/1191   Diastolic dysfunction 47/82/9562   Chest pain at rest 06/13/2012   Hypokalemia 06/13/2012   Cardiomegaly 06/13/2012   Idiopathic intracranial hypertension 09/03/2011   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   Empty sella ([place]) 02/20/2011   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  ONSET DATE: 07/15/2021   SUBJECTIVE:                                                                                                                                                                                           SUBJECTIVE STATEMENT:  Patient reports that her back is a bit sore. She carried a tray at [employer], which bothered her back.   PERTINENT HISTORY:  HTN, CHF,  Chiari, L1 compression fracture, frequent falls  PAIN:  Are you having pain? yes   PRECAUTIONS: Back and Fall, HOH   WEIGHT BEARING RESTRICTIONS No  FALLS:  Has patient fallen in last 6 months? Yes. Number of falls 3 "and I usually hit my head"    PLOF: Independent, Independent with basic ADLs, Independent with gait, and Independent with transfers  PATIENT GOALS stop hurting    TREATMENT   09/02/21 Nu-Step L4 x 6 minutes B side step against red Tband resistance- patient reported back pain, so removed resistance. 3 x 6 steps each direction. Heel raises 10 reps. Tandem stance with weight shifts in parallel bars, narrow BOS in parallel bars, SLS in parallel bars, unable to stand on LLE. Education for updated HEP, performing each activity in session.  08/29/21 Nustep L 6 min STS 30 sec without UE 8x TUG without AD  12 sec Tandem gait fwd and back on foam beam in // bars with UE  3x Side stepping on foam beam in // bars with minimal UE  3 X STS on airex with wt ball press 2 sets 5 Resisted gait 20# 5 x fwd, 5 x back, 3 x each side- cued to increase stride and keep upright posture Cable pulley row and shld ext 15 x each  Wt ball trunk ext 10 x Knee ext 5# 2 sets  10 HS curl 10# 2 sets 5      08/15/21 UBE L 2 1.5 min fwd/1.5 min back Red tband standing shld ext and row 2 sets 10 Red tband hip ext and abd 10 each Nustep L 3 5 min UE/LE STS 10 x with wt ball chest press LAQ and hip flex 3# 2 sets 10 Ball squeeze 15x Green tband clams 15x Resited gait 20# 5 x fwd/back and 3 x each way Trunk ext seated green tband 15x 2# OH trunk ext 10 x Reviewed BLTS and BM  08/19/21  Practiced bed mobility in and out of bed min-mod cues for BLT form   Supine bridges 1x15 Supine clams red TB 1x15  Supine bridge + clam red TB 1x15  Nustep L3 BLEs only   Forward step ups 4 inch box x10 B U UE support  Lateral step ups 4 inch box x10 B B UE support  Forward lunges on TM 1x10 B BUE support  Sit to stands x10 6# no UEs  Farmers carry with 6# [phone]  Semi tandem stance 3x30 seconds B   08/22/21 Nustep L 4 6 min Resisted gait 30# 5 x fwd ,5 x backward, 3x each side Black tband trunk ext 2 sets 10 Seated row 20 # and Lat Pull 20# 2 sets 10 Cable pulleys 10# 2 sets 10 STS with wt ball OH press 10 x 6# farmers carry 3# stick flex,chest press and ext 10 each       PATIENT EDUCATION:  Education details: BLT on going with tx and actvity Person educated: Patient Education method: Explanation Education comprehension: verbalized understanding, returned demonstration, and needs further education   HOME EXERCISE PROGRAM: [id number]- bridges, seated marches, seated clamshells, narrow BOS   ASSESSMENT:  CLINICAL IMPRESSION: Patient reports mild back pain today. Very fearful to attempt activities. Emphasized trunk stability as well as lateral hip stability, then balance training to increase confidence and decresae fall risk.  OBJECTIVE IMPAIRMENTS decreased activity tolerance, decreased balance, decreased cognition, decreased coordination, decreased endurance, decreased knowledge of condition, decreased knowledge of use of DME, decreased mobility,  decreased strength,  decreased safety awareness, postural dysfunction, obesity, and pain.   ACTIVITY LIMITATIONS community activity, laundry, yard work, shopping, and yard work.   PERSONAL FACTORS Age, Behavior pattern, Fitness, Past/current experiences, and Time since onset of injury/illness/exacerbation are also affecting patient's functional outcome.    REHAB POTENTIAL: Good  CLINICAL DECISION MAKING: Stable/uncomplicated    GOALS: Goals reviewed with patient? No  SHORT TERM GOALS: Target date: 08/27/2021    (Remove Blue Hyperlink)  Will be independent with HEP  Baseline: Goal status: met  2.  Will be compliant with BLT precautions until released by MD  Baseline:  Goal status: met 3.  Will require no more than 2 rest breaks during PT session to show improved functional activity tolerance  Baseline:  Goal status: 5  4.  Will be able to name 3 ways to reduce fall risk at home and in the community  Baseline:  Goal status: met  LONG TERM GOALS: Target date: 09/24/2021  (Remove Blue Hyperlink)  MMT to improve by 1 grade in all weak groups  Baseline:  Goal status: 0ongoing 2.  Berg to be at least 47 to show reduced fall risk  Baseline:  Goal status: INITIAL  3.  Will be able to complete floor to stand transfers safely with no more than S  Baseline:  Goal status: INITIAL  4.  Pain in lumbar spine to be no more than 1/10 at worst with functional task performance  Baseline: Fluctuates Goal status: ongoing    PLAN: PT FREQUENCY: 2x/week  PT DURATION: 8 weeks  PLANNED INTERVENTIONS: Therapeutic exercises, Therapeutic activity, Neuromuscular re-education, Balance training, Gait training, Patient/Family education, Joint mobilization, Stair training, DME instructions, Cognitive remediation, Cryotherapy, Moist heat, Ultrasound, Ionotophoresis 68m/ml Dexamethasone, Manual therapy, and Re-evaluation.  PLAN FOR NEXT SESSION: Strength, balance, functional activity tolerance,  review BLT precautions     SEthel RanaDPT 09/02/21 11:48 AM  09/02/2021, 11:48 AM

## 2021-09-06 ENCOUNTER — Ambulatory Visit: Payer: Medicare Other | Admitting: Physical Therapy

## 2021-09-06 DIAGNOSIS — M6281 Muscle weakness (generalized): Secondary | ICD-10-CM

## 2021-09-10 ENCOUNTER — Encounter: Payer: Self-pay | Admitting: Physical Therapy

## 2021-09-10 ENCOUNTER — Ambulatory Visit: Payer: Medicare Other | Admitting: Physical Therapy

## 2021-09-10 DIAGNOSIS — M6281 Muscle weakness (generalized): Secondary | ICD-10-CM

## 2021-09-10 DIAGNOSIS — R293 Abnormal posture: Secondary | ICD-10-CM

## 2021-09-10 DIAGNOSIS — M5459 Other low back pain: Secondary | ICD-10-CM

## 2021-09-10 DIAGNOSIS — R296 Repeated falls: Secondary | ICD-10-CM

## 2021-09-10 DIAGNOSIS — R2681 Unsteadiness on feet: Secondary | ICD-10-CM

## 2021-09-12 ENCOUNTER — Ambulatory Visit: Payer: Medicare Other | Admitting: Physical Therapy

## 2021-09-19 ENCOUNTER — Encounter: Payer: Self-pay | Admitting: Physical Therapy

## 2021-09-19 ENCOUNTER — Ambulatory Visit: Payer: Medicare Other | Attending: Neurosurgery | Admitting: Physical Therapy

## 2021-09-19 DIAGNOSIS — R293 Abnormal posture: Secondary | ICD-10-CM | POA: Insufficient documentation

## 2021-09-19 DIAGNOSIS — R296 Repeated falls: Secondary | ICD-10-CM | POA: Insufficient documentation

## 2021-09-19 DIAGNOSIS — M6281 Muscle weakness (generalized): Secondary | ICD-10-CM | POA: Insufficient documentation

## 2021-09-19 DIAGNOSIS — R2681 Unsteadiness on feet: Secondary | ICD-10-CM | POA: Insufficient documentation

## 2021-09-19 DIAGNOSIS — M5459 Other low back pain: Secondary | ICD-10-CM | POA: Diagnosis present

## 2021-09-19 NOTE — Therapy (Signed)
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   Hx of echocardiogram    Echo 3/14: EF 55-60%, Gr 1 DD, mild LAE   Hypertension    Past Surgical History:  Procedure Laterality Date   DILATION AND CURETTAGE OF UTERUS     Patient Active Problem List   Diagnosis Date Noted   Age-related osteoporosis without current pathological fracture 07/31/2021   Wedge compression fracture of unspecified lumbar vertebra, initial encounter for closed fracture ([place]) 07/31/2021   Allergic rhinitis 05/10/2021   Edema 05/10/2021   Family history of malignant neoplasm of gastrointestinal tract 05/10/2021   Generalized anxiety disorder 05/10/2021   History of adenomatous polyp of colon 05/10/2021   History of diverticulitis 05/10/2021   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[place]) 05/10/2021   Urinary incontinence 05/10/2021   UTI (urinary tract infection) 04/18/2021   OSA (obstructive sleep apnea) 04/18/2021   Chronic diastolic CHF (congestive heart failure) ([place]) 04/17/2021   Closed compression fracture of body of L1 vertebra ([place]) 04/17/2021   Compression fracture of L1 lumbar vertebra ([place]) 04/16/2021   Frequent falls 04/16/2021   Morbid obesity ([place]) 04/16/2021   Hypocalcemia 04/16/2021   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   Pain in right hand 03/28/2021   Retention of urine  79/98/7215   Diastolic dysfunction 87/27/6184   Chest pain at rest 06/13/2012   Hypokalemia 06/13/2012   Cardiomegaly 06/13/2012   Idiopathic intracranial hypertension 09/03/2011   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   Empty sella ([place]) 02/20/2011   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    PERTINENT HISTORY:  HTN, CHF, Chiari, L1 compression fracture, frequent falls  PAIN:  Are you having pain? No   PRECAUTIONS: Back and Fall, HOH   WEIGHT BEARING RESTRICTIONS No  FALLS:  Has patient fallen in last 6 months? Yes. Number of falls 3 "and I usually hit my head"    PLOF: Independent, Independent with basic ADLs, Independent with gait, and Independent with transfers  PATIENT GOALS stop hurting    TREATMENT 09/19/21 NuStep L5 x 6 min 6" step ups B with HHA 2x10 6" lateral step ups with HHA 1x10 B Balance on airex with HHA x 30 secs x2 Step ups from airex pad with HHA 1x5 B, increased fear of falling STS with 5# 1x10 STS on airex pad 1x10, with hands on thighs Resisted gait 20# forward and sideways x4 each, had to d/c due to left sided lower back pain Left hamstring stretch 2x15"  09/10/21 Nustep L4 x 6 mins B hamstring & piriformis stretch 3x10" each, tightness  L>R Ambulate 1 lap Supine exercises performed with MH applied: Clams with yellow TB 2x10 Hip flex with yellow TB 2x10 Ball squeeze 2x10 KTC with red ball 2x10 Isometric TA with red ball 2x10  09/06/21 UBE L3 x 6 mins, 3 min forward, backwards Resisted gait 20# in all directions x 5 each side, x 3 forward/backward Steps ups on 4" box x 10 B with HHA S2S without UE's x10, attempted S2S on airex but discontinued due to pt fear of falling Balance on airex with HHA x 30 secs x2 Shoulder rows with red TB 2x10 Shoulder extension with red TB x10  09/02/21 Nu-Step L4 x 6 minutes B side step against red Tband resistance- patient reported back pain, so removed resistance. 3 x 6 steps each direction. Heel raises 10 reps. Tandem stance with weight shifts in parallel bars, narrow BOS in parallel bars, SLS in parallel bars, unable to stand on LLE. Education for updated HEP, performing each activity in session.  08/29/21 Nustep L 6 min STS 30 sec without UE 8x TUG without AD  12 sec Tandem gait fwd and back on foam beam in // bars with UE  3x Side stepping on foam beam in // bars with minimal UE  3 X STS on airex with wt ball press 2 sets 5 Resisted gait 20# 5 x fwd, 5 x back, 3 x each side- cued to increase stride and keep upright posture Cable pulley row and shld ext 15 x each  Wt ball trunk ext 10 x Knee ext 5# 2 sets 10 HS curl 10# 2 sets 5  08/15/21 UBE L 2 1.5 min fwd/1.5 min back Red tband standing shld ext and row 2 sets 10 Red tband hip ext and abd 10 each Nustep L 3 5 min UE/LE STS 10 x with wt ball chest press LAQ and hip flex 3# 2 sets 10 Ball squeeze 15x Green tband clams 15x Resited gait 20# 5 x fwd/back and 3 x each way Trunk ext seated green tband 15x 2# OH trunk ext 10 x Reviewed BLTS and BM  08/19/21  Practiced bed mobility in and out of bed min-mod cues for BLT form   Supine bridges 1x15 Supine clams red TB 1x15  Supine bridge + clam red TB 1x15  Nustep L3 BLEs  only   Forward step ups 4 inch box x10 B U UE  support  Lateral step ups 4 inch box x10 B B UE support  Forward lunges on TM 1x10 B BUE support  Sit to stands x10 6# no UEs  Farmers carry with 6# [id number]  Semi tandem stance 3x30 seconds B   08/22/21 Nustep L 4 6 min Resisted gait 30# 5 x fwd ,5 x backward, 3x each side Black tband trunk ext 2 sets 10 Seated row 20 # and Lat Pull 20# 2 sets 10 Cable pulleys 10# 2 sets 10 STS with wt ball OH press 10 x 6# farmers carry 3# stick flex,chest press and ext 10 each       PATIENT EDUCATION:  Education details: BLT on going with tx and actvity Person educated: Patient Education method: Explanation Education comprehension: verbalized understanding, returned demonstration, and needs further education   HOME EXERCISE PROGRAM: [id number]- bridges, seated marches, seated clamshells, narrow BOS   ASSESSMENT:  CLINICAL IMPRESSION: Pt enters today stating she is feeling a lot better than last time. She denies any pain. She also states she stopped taking her prescribed Lasix and noticed her back pain was relieved, pt was advised to contact doctor before d/cing any medications. Pt still finds most difficulty with balance exercises, with a significant fear of falling impacting her performance. Pt requires occasional HHA with activities due to LOB and fear, but has shown improvement with balance tolerance with STS on airex pad requiring SBA only. Pt complaining of lower back pain when doing resisted gait, so we ended exercises and did some stretching, which she states relieved the pain. Pt would benefit from additional strength and balance training.   OBJECTIVE IMPAIRMENTS decreased activity tolerance, decreased balance, decreased cognition, decreased coordination, decreased endurance, decreased knowledge of condition, decreased knowledge of use of DME, decreased mobility, decreased strength, decreased safety awareness, postural dysfunction, obesity, and  pain.   ACTIVITY LIMITATIONS community activity, laundry, yard work, shopping, and yard work.   PERSONAL FACTORS Age, Behavior pattern, Fitness, Past/current experiences, and Time since onset of injury/illness/exacerbation are also affecting patient's functional outcome.    REHAB POTENTIAL: Good  CLINICAL DECISION MAKING: Stable/uncomplicated    GOALS: Goals reviewed with patient? No  SHORT TERM GOALS: Target date: 08/27/2021    (Remove Blue Hyperlink)  Will be independent with HEP  Baseline: Goal status: met  2.  Will be compliant with BLT precautions until released by MD  Baseline:  Goal status: met  3.  Will require no more than 2 rest breaks during PT session to show improved functional activity tolerance  Baseline:  Goal status:partial met- still requires rest breaks  4.  Will be able to name 3 ways to reduce fall risk at home and in the community  Baseline:  Goal status: met  LONG TERM GOALS: Target date: 09/24/2021  (Remove Blue Hyperlink)  MMT to improve by 1 grade in all weak groups  Baseline:  Goal status: Progressing  2.  Berg to be at least 47 to show reduced fall risk  Baseline:  Goal status: partial met  3.  Will be able to complete floor to stand transfers safely with no more than S  Baseline:  Goal status: INITIAL  4.  Pain in lumbar spine to be no more than 1/10 at worst with functional task performance  Baseline: Fluctuates Goal status: 09/19/21 - Progressing, still having flare ups but was 0/10 today    PLAN: PT FREQUENCY: 2x/week  PT DURATION: 8 weeks  PLANNED INTERVENTIONS: Therapeutic exercises, Therapeutic activity, Neuromuscular re-education, Balance  training, Gait training, Patient/Family education, Joint mobilization, Stair training, DME instructions, Cognitive remediation, Cryotherapy, Moist heat, Ultrasound, Ionotophoresis 71m/ml Dexamethasone, Manual therapy, and Re-evaluation.  PLAN FOR NEXT SESSION: Renew for additional PT  sessions.   AClearance Coots SPTA Student was fully supervised during tx. ALevada DyPayseur PTA 09/19/21 3:47 PM  09/19/2021, 3:47 PM

## 2021-09-24 ENCOUNTER — Encounter: Payer: Self-pay | Admitting: Physical Therapy

## 2021-09-24 ENCOUNTER — Ambulatory Visit: Payer: Medicare Other | Admitting: Physical Therapy

## 2021-09-24 DIAGNOSIS — R2681 Unsteadiness on feet: Secondary | ICD-10-CM

## 2021-09-24 DIAGNOSIS — R293 Abnormal posture: Secondary | ICD-10-CM

## 2021-09-24 DIAGNOSIS — M5459 Other low back pain: Secondary | ICD-10-CM

## 2021-09-24 DIAGNOSIS — M6281 Muscle weakness (generalized): Secondary | ICD-10-CM

## 2021-09-24 DIAGNOSIS — R296 Repeated falls: Secondary | ICD-10-CM

## 2021-09-24 NOTE — Therapy (Signed)
OUTPATIENT PHYSICAL THERAPY THORACOLUMBAR  Progress Note Reporting Period 07/30/21 to 09/24/21  See note below for Objective Data and Assessment of Progress/Goals.     Patient Name: Ann Graham MRN: [id number] DOB:18-Apr-1955, 66 y.o., female Today's Date: 09/24/2021   PT End of Session - 09/24/21 1348     Visit Number 10    Date for PT Re-Evaluation 09/24/21    Authorization Time Period 07/27/21 to 09/24/21    PT Start Time 1349    PT Stop Time 1430    PT Time Calculation (min) 41 min    Activity Tolerance Patient tolerated treatment well    Behavior During Therapy [facility] for tasks assessed/performed               Past Medical History:  Diagnosis Date   Anxiety    Budd-Chiari syndrome (HCC)    Elevated cholesterol    Hx of cardiovascular stress test    Lexiscan Myoview 4/14:  No ischemia, EF 75%.   Hx of echocardiogram    Echo 3/14: EF 55-60%, Gr 1 DD, mild LAE   Hypertension    Past Surgical History:  Procedure Laterality Date   DILATION AND CURETTAGE OF UTERUS     Patient Active Problem List   Diagnosis Date Noted   Age-related osteoporosis without current pathological fracture 07/31/2021   Wedge compression fracture of unspecified lumbar vertebra, initial encounter for closed fracture ([place]) 07/31/2021   Allergic rhinitis 05/10/2021   Edema 05/10/2021   Family history of malignant neoplasm of gastrointestinal tract 05/10/2021   Generalized anxiety disorder 05/10/2021   History of adenomatous polyp of colon 05/10/2021   History of diverticulitis 05/10/2021   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[place]) 05/10/2021   Urinary incontinence 05/10/2021   UTI (urinary tract infection) 04/18/2021   OSA (obstructive sleep apnea) 04/18/2021   Chronic diastolic CHF (congestive heart failure) ([place]) 04/17/2021   Closed compression fracture of body of L1 vertebra ([place]) 04/17/2021   Compression fracture of L1 lumbar vertebra ([place]) 04/16/2021   Frequent falls 04/16/2021    Morbid obesity ([place]) 04/16/2021   Hypocalcemia 04/16/2021   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   Pain in right hand 03/28/2021   Retention of urine 82/50/5397   Diastolic dysfunction 67/34/1937   Chest pain at rest 06/13/2012   Hypokalemia 06/13/2012   Cardiomegaly 06/13/2012   Idiopathic intracranial hypertension 09/03/2011   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   Empty sella ([place]) 02/20/2011   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[place]) 02/10/2011   Papilledema 12/20/2010   Hypercholesterolemia 08/12/2008   OBESITY 08/12/2008   HYPERTENSION 08/12/2008    PCP: Jenny Reichmann   REFERRING PROVIDER: Consuella Lose   REFERRING DIAG: S32.010D (ICD-10-CM) - Wedge compression fracture of first lumbar vertebra, subsequent encounter for fracture with routine healing   THERAPY DIAG:  Muscle weakness (generalized)  Unsteadiness on feet  Other low back pain  Abnormal posture  Repeated falls  ONSET DATE: 07/15/2021   SUBJECTIVE:  SUBJECTIVE STATEMENT:  "Im doing good" Can feel things  are improving    PERTINENT HISTORY:  HTN, CHF, Chiari, L1 compression fracture, frequent falls  PAIN:  Are you having pain? No   PRECAUTIONS: Back and Fall, HOH   WEIGHT BEARING RESTRICTIONS No  FALLS:  Has patient fallen in last 6 months? Yes. Number of falls 3 "and I usually hit my head"    PLOF: Independent, Independent with basic ADLs, Independent with gait, and Independent with transfers  PATIENT GOALS stop hurting    TREATMENT  09/24/21 Recumbent bike L1 x 6 min  MMT hip Flex R 4/10 L 4+/5, abd R 4/5 L4/5, add R3+/5  L3+/5, Knee Flex R4/5  L4+/5 Ext R 4-/5 L4-/5 S2S holding yellow ball 2x10 Step ups 6in x10 each HHA x1 Hamstring Curls 20lb 2x10 Leg Extensions 5lb 2x10  Ball squeezes 2x10   09/19/21 NuStep  L5 x 6 min 6" step ups B with HHA 2x10 6" lateral step ups with HHA 1x10 B Balance on airex with HHA x 30 secs x2 Step ups from airex pad with HHA 1x5 B, increased fear of falling STS with 5# 1x10 STS on airex pad 1x10, with hands on thighs Resisted gait 20# forward and sideways x4 each, had to d/c due to left sided lower back pain Left hamstring stretch 2x15"  09/10/21 Nustep L4 x 6 mins B hamstring & piriformis stretch 3x10" each, tightness L>R Ambulate 1 lap Supine exercises performed with MH applied: Clams with yellow TB 2x10 Hip flex with yellow TB 2x10 Ball squeeze 2x10 KTC with red ball 2x10 Isometric TA with red ball 2x10  09/06/21 UBE L3 x 6 mins, 3 min forward, backwards Resisted gait 20# in all directions x 5 each side, x 3 forward/backward Steps ups on 4" box x 10 B with HHA S2S without UE's x10, attempted S2S on airex but discontinued due to pt fear of falling Balance on airex with HHA x 30 secs x2 Shoulder rows with red TB 2x10 Shoulder extension with red TB x10    PATIENT EDUCATION:  Education details: BLT on going with tx and actvity Person educated: Patient Education method: Explanation Education comprehension: verbalized understanding, returned demonstration, and needs further education   HOME EXERCISE PROGRAM: [id number]- bridges, seated marches, seated clamshells, narrow BOS   ASSESSMENT:  CLINICAL IMPRESSION: Pt enters today stating she is feeling a lot better. She has progressed increasing her LE strength in some muscle groups. Some hips weakness and fatigue present with step ups. Added machine level LE strengthening without issues. Pt reports that she does have difficulty picking things up and vacuuming.     OBJECTIVE IMPAIRMENTS decreased activity tolerance, decreased balance, decreased cognition, decreased coordination, decreased endurance, decreased knowledge of condition, decreased knowledge of use of DME, decreased mobility, decreased strength,  decreased safety awareness, postural dysfunction, obesity, and pain.   ACTIVITY LIMITATIONS community activity, laundry, yard work, shopping, and yard work.   PERSONAL FACTORS Age, Behavior pattern, Fitness, Past/current experiences, and Time since onset of injury/illness/exacerbation are also affecting patient's functional outcome.    REHAB POTENTIAL: Good  CLINICAL DECISION MAKING: Stable/uncomplicated    GOALS: Goals reviewed with patient? No  SHORT TERM GOALS: Target date: 08/27/2021    (Remove Blue Hyperlink)  Will be independent with HEP  Baseline: Goal status: met  2.  Will be compliant with BLT precautions until released by MD  Baseline:  Goal status: met  3.  Will require no more than 2 rest breaks  during PT session to show improved functional activity tolerance  Baseline:  Goal status:partial met- still requires rest breaks  4.  Will be able to name 3 ways to reduce fall risk at home and in the community  Baseline:  Goal status: met  LONG TERM GOALS: Target date: 09/24/2021  (Remove Blue Hyperlink)  MMT to improve by 1 grade in all weak groups  Baseline:  Goal status: Progressing  2.  Berg to be at least 47 to show reduced fall risk  Baseline:  Goal status: partial met  3.  Will be able to complete floor to stand transfers safely with no more than S  Baseline:  Goal status: ongoing   4.  Pain in lumbar spine to be no more than 1/10 at worst with functional task performance  Baseline: Fluctuates Goal status: 09/19/21 - Progressing, still having flare ups but was 0/10 today    PLAN: PT FREQUENCY: 2x/week  PT DURATION: 8 weeks  PLANNED INTERVENTIONS: Therapeutic exercises, Therapeutic activity, Neuromuscular re-education, Balance training, Gait training, Patient/Family education, Joint mobilization, Stair training, DME instructions, Cognitive remediation, Cryotherapy, Moist heat, Ultrasound, Ionotophoresis 36m/ml Dexamethasone, Manual therapy, and  Re-evaluation.  PLAN FOR NEXT SESSION: Renew for additional PT sessions.   RCheri FowlerPTA  09/24/21 1:49 PM  09/24/2021, 1:49 PM

## 2021-09-26 ENCOUNTER — Ambulatory Visit: Payer: Medicare Other | Admitting: Physical Therapy

## 2021-09-26 ENCOUNTER — Encounter: Payer: Self-pay | Admitting: Physical Therapy

## 2021-09-26 DIAGNOSIS — M5459 Other low back pain: Secondary | ICD-10-CM

## 2021-09-26 DIAGNOSIS — R2681 Unsteadiness on feet: Secondary | ICD-10-CM

## 2021-09-26 DIAGNOSIS — R293 Abnormal posture: Secondary | ICD-10-CM

## 2021-09-26 DIAGNOSIS — R296 Repeated falls: Secondary | ICD-10-CM

## 2021-09-26 DIAGNOSIS — M6281 Muscle weakness (generalized): Secondary | ICD-10-CM | POA: Diagnosis not present

## 2021-09-26 NOTE — Therapy (Signed)
OUTPATIENT PHYSICAL THERAPY THORACOLUMBAR      Patient Name: Ann Graham MRN: [id number] DOB:03-Sep-1955, 66 y.o., female Today's Date: 09/26/2021   PT End of Session - 09/26/21 1558     Visit Number 11    Date for PT Re-Evaluation 10/25/21    PT Start Time 1600    PT Stop Time 1645    PT Time Calculation (min) 45 min    Activity Tolerance Patient tolerated treatment well    Behavior During Therapy [facility] for tasks assessed/performed               Past Medical History:  Diagnosis Date   Anxiety    Budd-Chiari syndrome ([place])    Elevated cholesterol    Hx of cardiovascular stress test    Lexiscan Myoview 4/14:  No ischemia, EF 75%.   Hx of echocardiogram    Echo 3/14: EF 55-60%, Gr 1 DD, mild LAE   Hypertension    Past Surgical History:  Procedure Laterality Date   DILATION AND CURETTAGE OF UTERUS     Patient Active Problem List   Diagnosis Date Noted   Age-related osteoporosis without current pathological fracture 07/31/2021   Wedge compression fracture of unspecified lumbar vertebra, initial encounter for closed fracture ([place]) 07/31/2021   Allergic rhinitis 05/10/2021   Edema 05/10/2021   Family history of malignant neoplasm of gastrointestinal tract 05/10/2021   Generalized anxiety disorder 05/10/2021   History of adenomatous polyp of colon 05/10/2021   History of diverticulitis 05/10/2021   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[place]) 05/10/2021   Urinary incontinence 05/10/2021   UTI (urinary tract infection) 04/18/2021   OSA (obstructive sleep apnea) 04/18/2021   Chronic diastolic CHF (congestive heart failure) ([place]) 04/17/2021   Closed compression fracture of body of L1 vertebra ([place]) 04/17/2021   Compression fracture of L1 lumbar vertebra ([place]) 04/16/2021   Frequent falls 04/16/2021   Morbid obesity ([place]) 04/16/2021   Hypocalcemia 04/16/2021   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   Pain in right hand 03/28/2021   Retention of urine 69/45/0388    Diastolic dysfunction 82/80/0349   Chest pain at rest 06/13/2012   Hypokalemia 06/13/2012   Cardiomegaly 06/13/2012   Idiopathic intracranial hypertension 09/03/2011   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   Empty sella ([place]) 02/20/2011   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   Papilledema 12/20/2010   Hypercholesterolemia 08/12/2008   OBESITY 08/12/2008   HYPERTENSION 08/12/2008    PCP: Jenny Reichmann   REFERRING PROVIDER: Consuella Lose   REFERRING DIAG: S32.010D (ICD-10-CM) - Wedge compression fracture of first lumbar vertebra, subsequent encounter for fracture with routine healing   THERAPY DIAG:  Muscle weakness (generalized)  Unsteadiness on feet  Other low back pain  Abnormal posture  Repeated falls  ONSET DATE: 07/15/2021   SUBJECTIVE:  SUBJECTIVE STATEMENT:  "I did too much walking yesterday" "Tired, my back hurts, but it is to be expected"  PERTINENT HISTORY:  HTN, CHF, Chiari, L1 compression fracture, frequent falls  PAIN:  Are you having pain? 6/10 lower back   PRECAUTIONS: Back and Fall, HOH   WEIGHT BEARING RESTRICTIONS No  FALLS:  Has patient fallen in last 6 months? Yes. Number of falls 3 "and I usually hit my head"    PLOF: Independent, Independent with basic ADLs, Independent with gait, and Independent with transfers  PATIENT GOALS stop hurting    TREATMENT  09/26/21 NuStep L5 X6 min Resisted side steps 20lb x3 each  Step ups 6in x10 each HHA x1 Lateral Step ups 6in x5 each Step ups 6in x5 each S2S holding yellow ball 2x10 Ball Squeezes 2x10 Hamstring Curls 20lb 3x10 Leg Extensions 5lb 2x10  Ball squeezes 2x10 Supine bridges x10 Hamstring stretches     09/24/21 Recumbent bike L1 x 6 min  MMT hip Flex R 4/10 L 4+/5, abd R 4/5 L4/5, add R3+/5   L3+/5, Knee Flex R4/5  L4+/5 Ext R 4-/5 L4-/5 S2S holding yellow ball 2x10 Step ups 6in x10 each HHA x1 Hamstring Curls 20lb 2x10 Leg Extensions 5lb 2x10  Ball squeezes 2x10    09/19/21 NuStep L5 x 6 min 6" step ups B with HHA 2x10 6" lateral step ups with HHA 1x10 B Balance on airex with HHA x 30 secs x2 Step ups from airex pad with HHA 1x5 B, increased fear of falling STS with 5# 1x10 STS on airex pad 1x10, with hands on thighs Resisted gait 20# forward and sideways x4 each, had to d/c due to left sided lower back pain Left hamstring stretch 2x15"  09/10/21 Nustep L4 x 6 mins B hamstring & piriformis stretch 3x10" each, tightness L>R Ambulate 1 lap Supine exercises performed with MH applied: Clams with yellow TB 2x10 Hip flex with yellow TB 2x10 Ball squeeze 2x10 KTC with red ball 2x10 Isometric TA with red ball 2x10  09/06/21 UBE L3 x 6 mins, 3 min forward, backwards Resisted gait 20# in all directions x 5 each side, x 3 forward/backward Steps ups on 4" box x 10 B with HHA S2S without UE's x10, attempted S2S on airex but discontinued due to pt fear of falling Balance on airex with HHA x 30 secs x2 Shoulder rows with red TB 2x10 Shoulder extension with red TB x10    PATIENT EDUCATION:  Education details: BLT on going with tx and actvity Person educated: Patient Education method: Explanation Education comprehension: verbalized understanding, returned demonstration, and needs further education   HOME EXERCISE PROGRAM: [id number]- bridges, seated marches, seated clamshells, narrow BOS   ASSESSMENT:  CLINICAL IMPRESSION: Pt enters today reporting increase fatigue from doing a lot of walking and stairs yesterday. Some of the functional intervention were limited due to pt subjective reports. Increase pain reported with supine bridges. Cue for full ROM needed with leg curls and extensions. Cue needed to hold contraction with ball squeezes.  OBJECTIVE IMPAIRMENTS decreased  activity tolerance, decreased balance, decreased cognition, decreased coordination, decreased endurance, decreased knowledge of condition, decreased knowledge of use of DME, decreased mobility, decreased strength, decreased safety awareness, postural dysfunction, obesity, and pain.   ACTIVITY LIMITATIONS community activity, laundry, yard work, shopping, and yard work.   PERSONAL FACTORS Age, Behavior pattern, Fitness, Past/current experiences, and Time since onset of injury/illness/exacerbation are also affecting patient's functional outcome.    REHAB POTENTIAL: Good  CLINICAL  DECISION MAKING: Stable/uncomplicated    GOALS: Goals reviewed with patient? No  SHORT TERM GOALS: Target date: 08/27/2021    (Remove Blue Hyperlink)  Will be independent with HEP  Baseline: Goal status: met  2.  Will be compliant with BLT precautions until released by MD  Baseline:  Goal status: met  3.  Will require no more than 2 rest breaks during PT session to show improved functional activity tolerance  Baseline:  Goal status:partial met- still requires rest breaks  4.  Will be able to name 3 ways to reduce fall risk at home and in the community  Baseline:  Goal status: met  LONG TERM GOALS: Target date: 09/24/2021  (Remove Blue Hyperlink)  MMT to improve by 1 grade in all weak groups  Baseline:  Goal status: Progressing  2.  Berg to be at least 47 to show reduced fall risk  Baseline:  Goal status: partial met  3.  Will be able to complete floor to stand transfers safely with no more than S  Baseline:  Goal status: ongoing   4.  Pain in lumbar spine to be no more than 1/10 at worst with functional task performance  Baseline: Fluctuates Goal status: 09/19/21 - Progressing, still having flare ups but was 0/10 today    PLAN: PT FREQUENCY: 2x/week  PT DURATION: 8 weeks  PLANNED INTERVENTIONS: Therapeutic exercises, Therapeutic activity, Neuromuscular re-education, Balance training, Gait  training, Patient/Family education, Joint mobilization, Stair training, DME instructions, Cognitive remediation, Cryotherapy, Moist heat, Ultrasound, Ionotophoresis 52m/ml Dexamethasone, Manual therapy, and Re-evaluation.  PLAN FOR NEXT SESSION: Renew for additional PT sessions.   RCheri FowlerPTA  09/26/21 4:00 PM  09/26/2021, 4:00 PM

## 2021-09-30 ENCOUNTER — Encounter: Payer: Self-pay | Admitting: Physical Therapy

## 2021-09-30 ENCOUNTER — Ambulatory Visit: Payer: Medicare Other | Admitting: Physical Therapy

## 2021-09-30 DIAGNOSIS — M5459 Other low back pain: Secondary | ICD-10-CM

## 2021-09-30 DIAGNOSIS — R296 Repeated falls: Secondary | ICD-10-CM

## 2021-09-30 DIAGNOSIS — M6281 Muscle weakness (generalized): Secondary | ICD-10-CM | POA: Diagnosis not present

## 2021-09-30 DIAGNOSIS — R293 Abnormal posture: Secondary | ICD-10-CM

## 2021-09-30 DIAGNOSIS — R2681 Unsteadiness on feet: Secondary | ICD-10-CM

## 2021-09-30 NOTE — Therapy (Signed)
OUTPATIENT PHYSICAL THERAPY THORACOLUMBAR      Patient Name: Ann Graham MRN: [id number] DOB:1955-03-18, 66 y.o., female Today's Date: 09/30/2021   PT End of Session - 09/30/21 1516     Visit Number 12    Number of Visits 17    Date for PT Re-Evaluation 10/25/21    Authorization Type M[employer] and [employer]    Authorization Time Period 07/27/21 to 09/24/21    Progress Note Due on Visit 10    PT Start Time 1516    PT Stop Time 1600    PT Time Calculation (min) 44 min    Activity Tolerance Patient tolerated treatment well    Behavior During Therapy [facility] for tasks assessed/performed               Past Medical History:  Diagnosis Date   Anxiety    Budd-Chiari syndrome (HCC)    Elevated cholesterol    Hx of cardiovascular stress test    Lexiscan Myoview 4/14:  No ischemia, EF 75%.   Hx of echocardiogram    Echo 3/14: EF 55-60%, Gr 1 DD, mild LAE   Hypertension    Past Surgical History:  Procedure Laterality Date   DILATION AND CURETTAGE OF UTERUS     Patient Active Problem List   Diagnosis Date Noted   Age-related osteoporosis without current pathological fracture 07/31/2021   Wedge compression fracture of unspecified lumbar vertebra, initial encounter for closed fracture ([place]) 07/31/2021   Allergic rhinitis 05/10/2021   Edema 05/10/2021   Family history of malignant neoplasm of gastrointestinal tract 05/10/2021   Generalized anxiety disorder 05/10/2021   History of adenomatous polyp of colon 05/10/2021   History of diverticulitis 05/10/2021   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[place]) 05/10/2021   Urinary incontinence 05/10/2021   UTI (urinary tract infection) 04/18/2021   OSA (obstructive sleep apnea) 04/18/2021   Chronic diastolic CHF (congestive heart failure) ([place]) 04/17/2021   Closed compression fracture of body of L1 vertebra ([place]) 04/17/2021   Compression fracture of L1 lumbar vertebra ([place]) 04/16/2021   Frequent falls 04/16/2021   Morbid obesity ([place])  04/16/2021   Hypocalcemia 04/16/2021   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   Pain in right hand 03/28/2021   Retention of urine 40/98/1191   Diastolic dysfunction 47/82/9562   Chest pain at rest 06/13/2012   Hypokalemia 06/13/2012   Cardiomegaly 06/13/2012   Idiopathic intracranial hypertension 09/03/2011   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   Empty sella ([place]) 02/20/2011   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   PERTINENT HISTORY:  HTN, CHF, Chiari, L1 compression fracture, frequent falls  PAIN:  Are you having pain? 3/10 lower back   PRECAUTIONS: Back and Fall, HOH   WEIGHT BEARING RESTRICTIONS No  FALLS:  Has patient fallen in last 6 months? Yes. Number of falls 3 "and I usually hit my head"    PLOF: Independent, Independent with basic ADLs, Independent with gait, and Independent with transfers  PATIENT GOALS stop hurting    TREATMENT  09/30/21 NuStep L5 x 6 min 6" step ups with HHA 1x10 B Standing hip abduction/flexion/extension 1# 1x10 each, verbal cues to keep hips neutral and upright posture 4 way resisted gait 20# 2x4 each, cues to keep hips out of ER STS with yellow ball 1x10 STS on airex 1x10 Leg extension 5# 2x10 Hamstring  curls 20# 2x10   09/26/21 NuStep L5 X6 min Resisted side steps 20lb x3 each  Step ups 6in x10 each HHA x1 Lateral Step ups 6in x5 each Step ups 6in x5 each S2S holding yellow ball 2x10 Ball Squeezes 2x10 Hamstring Curls 20lb 3x10 Leg Extensions 5lb 2x10  Ball squeezes 2x10 Supine bridges x10 Hamstring stretches     09/24/21 Recumbent bike L1 x 6 min  MMT hip Flex R 4/10 L 4+/5, abd R 4/5 L4/5, add R3+/5  L3+/5, Knee Flex R4/5  L4+/5 Ext R 4-/5 L4-/5 S2S holding yellow ball 2x10 Step ups 6in x10 each HHA x1 Hamstring Curls 20lb 2x10 Leg Extensions 5lb 2x10  Ball squeezes 2x10    09/19/21 NuStep L5 x 6 min 6" step ups B with HHA 2x10 6" lateral step ups with HHA 1x10 B Balance on airex with HHA x 30 secs x2 Step ups from airex pad with HHA 1x5 B, increased fear of falling STS with 5# 1x10 STS on airex pad 1x10, with hands on thighs Resisted gait 20# forward and sideways x4 each, had to d/c due to left sided lower back pain Left hamstring stretch 2x15"  09/10/21 Nustep L4 x 6 mins B hamstring & piriformis stretch 3x10" each, tightness L>R Ambulate 1 lap Supine exercises performed with MH applied: Clams with yellow TB 2x10 Hip flex with yellow TB 2x10 Ball squeeze 2x10 KTC with red ball 2x10 Isometric TA with red ball 2x10  09/06/21 UBE L3 x 6 mins, 3 min forward, backwards Resisted gait 20# in all directions x 5 each side, x 3 forward/backward Steps ups on 4" box x 10 B with HHA S2S without UE's x10, attempted S2S on airex but discontinued due to pt fear of falling Balance on airex with HHA x 30 secs x2 Shoulder rows with red TB 2x10 Shoulder extension with red TB x10    PATIENT EDUCATION:  Education details: BLT on going with tx and actvity Person educated: Patient Education method: Explanation Education comprehension: verbalized understanding, returned demonstration, and needs further education   HOME EXERCISE PROGRAM: [id number]- bridges, seated marches,  seated clamshells, narrow BOS   ASSESSMENT:  CLINICAL IMPRESSION: Pt enters today doing well. We did some lower extremity strengthening and balance training. Requires constant verbal cues to keep hips from ER and upright posture during standing exercises. Still fatigues quickly, requiring increased rest breaks. Would benefit from additional LE strengthening and balance training to reduce fall risk.   OBJECTIVE IMPAIRMENTS decreased activity tolerance, decreased balance, decreased cognition, decreased coordination, decreased endurance, decreased knowledge of condition, decreased knowledge of use of DME, decreased  mobility, decreased strength, decreased safety awareness, postural dysfunction, obesity, and pain.   ACTIVITY LIMITATIONS community activity, laundry, yard work, shopping, and yard work.   PERSONAL FACTORS Age, Behavior pattern, Fitness, Past/current experiences, and Time since onset of injury/illness/exacerbation are also affecting patient's functional outcome.    REHAB POTENTIAL: Good  CLINICAL DECISION MAKING: Stable/uncomplicated    GOALS: Goals reviewed with patient? No  SHORT TERM GOALS: Target date: 08/27/2021    (Remove Blue Hyperlink)  Will be independent with HEP  Baseline: Goal status: met  2.  Will be compliant with BLT precautions until released by MD  Baseline:  Goal status: met  3.  Will require no more than 2 rest breaks during PT session to show improved functional activity tolerance  Baseline:  Goal status:partial met- still requires rest breaks  4.  Will be able to name 3 ways to reduce fall risk at home and in the community  Baseline:  Goal status: met  LONG TERM GOALS: Target date: 09/24/2021  (Remove Blue Hyperlink)  MMT to improve by 1 grade in all weak groups  Baseline:  Goal status: Progressing  2.  Berg to be at least 47 to show reduced fall risk  Baseline:  Goal status: partial met  3.  Will be able to complete floor to stand  transfers safely with no more than S  Baseline:  Goal status: ongoing   4.  Pain in lumbar spine to be no more than 1/10 at worst with functional task performance  Baseline: Fluctuates Goal status: 09/19/21 - Progressing, still having flare ups but was 0/10 today    PLAN: PT FREQUENCY: 2x/week  PT DURATION: 8 weeks  PLANNED INTERVENTIONS: Therapeutic exercises, Therapeutic activity, Neuromuscular re-education, Balance training, Gait training, Patient/Family education, Joint mobilization, Stair training, DME instructions, Cognitive remediation, Cryotherapy, Moist heat, Ultrasound, Ionotophoresis 90m/ml Dexamethasone, Manual therapy, and Re-evaluation.  PLAN FOR NEXT SESSION: Continue strengthening and balance.   AMarinette SPTA  09/30/21 3:19 PM  09/30/2021, 3:19 PM

## 2021-10-02 ENCOUNTER — Ambulatory Visit: Payer: Medicare Other

## 2021-10-02 DIAGNOSIS — M6281 Muscle weakness (generalized): Secondary | ICD-10-CM

## 2021-10-02 DIAGNOSIS — M5459 Other low back pain: Secondary | ICD-10-CM

## 2021-10-02 DIAGNOSIS — R2681 Unsteadiness on feet: Secondary | ICD-10-CM

## 2021-10-02 DIAGNOSIS — R296 Repeated falls: Secondary | ICD-10-CM

## 2021-10-02 DIAGNOSIS — R293 Abnormal posture: Secondary | ICD-10-CM

## 2021-10-02 NOTE — Therapy (Signed)
OUTPATIENT PHYSICAL THERAPY THORACOLUMBAR      Patient Name: Ann Graham MRN: [id number] DOB:23-Feb-1956, 66 y.o., female Today's Date: 10/02/2021   PT End of Session - 10/02/21 1534     Visit Number 13    Number of Visits 17    Date for PT Re-Evaluation 10/25/21    Authorization Type MCR and [employer]    Authorization Time Period 07/27/21 to 09/24/21    Progress Note Due on Visit 10    PT Start Time 1532    PT Stop Time 1615    PT Time Calculation (min) 43 min    Activity Tolerance Patient tolerated treatment well    Behavior During Therapy [facility] for tasks assessed/performed               Past Medical History:  Diagnosis Date   Anxiety    Budd-Chiari syndrome (HCC)    Elevated cholesterol    Hx of cardiovascular stress test    Lexiscan Myoview 4/14:  No ischemia, EF 75%.   Hx of echocardiogram    Echo 3/14: EF 55-60%, Gr 1 DD, mild LAE   Hypertension    Past Surgical History:  Procedure Laterality Date   DILATION AND CURETTAGE OF UTERUS     Patient Active Problem List   Diagnosis Date Noted   Age-related osteoporosis without current pathological fracture 07/31/2021   Wedge compression fracture of unspecified lumbar vertebra, initial encounter for closed fracture ([place]) 07/31/2021   Allergic rhinitis 05/10/2021   Edema 05/10/2021   Family history of malignant neoplasm of gastrointestinal tract 05/10/2021   Generalized anxiety disorder 05/10/2021   History of adenomatous polyp of colon 05/10/2021   History of diverticulitis 05/10/2021   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[place]) 05/10/2021   Urinary incontinence 05/10/2021   UTI (urinary tract infection) 04/18/2021   OSA (obstructive sleep apnea) 04/18/2021   Chronic diastolic CHF (congestive heart failure) ([place]) 04/17/2021   Closed compression fracture of body of L1 vertebra ([place]) 04/17/2021   Compression fracture of L1 lumbar vertebra ([place]) 04/16/2021   Frequent falls 04/16/2021   Morbid obesity ([place])  04/16/2021   Hypocalcemia 04/16/2021   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   Pain in right hand 03/28/2021   Retention of urine 79/15/0413   Diastolic dysfunction 64/38/3779   Chest pain at rest 06/13/2012   Hypokalemia 06/13/2012   Cardiomegaly 06/13/2012   Idiopathic intracranial hypertension 09/03/2011   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   Empty sella ([place]) 02/20/2011   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[place]) 02/10/2011   Papilledema 12/20/2010   Hypercholesterolemia 08/12/2008   OBESITY 08/12/2008   HYPERTENSION 08/12/2008    PCP: Jenny Reichmann   REFERRING PROVIDER: Consuella Lose   REFERRING DIAG: S32.010D (ICD-10-CM) - Wedge compression fracture of first lumbar vertebra, subsequent encounter for fracture with routine healing   THERAPY DIAG:  Muscle weakness (generalized)  Unsteadiness on feet  Other low back pain  Abnormal posture  Repeated falls  ONSET DATE: 07/15/2021   S/UBJEC/TIVE:  SUBJECTIVE STATEMENT:  I fell this morning in the shower and rolled my ankle but I'm not hurting now.   PERTINENT HISTORY:  HTN, CHF, Chiari, L1 compression fracture, frequent falls  PAIN:  Are you having pain? 0/10   PRECAUTIONS: Back and Fall, HOH   WEIGHT BEARING RESTRICTIONS No  FALLS:  Has patient fallen in last 6 months? Yes, 4, last one on 10/02/21   PLOF: Independent, Independent with basic ADLs, Independent with gait, and Independent with transfers  PATIENT GOALS stop hurting    TREATMENT  10/02/21 NuStep L5 x 6 min 4 way resisted gait 20# x4 each Attempted step ups on 4" and on airex and d/c due to pain in right hip Ball squeeze 2x10 Clams with red TB 2x10 Isometric abdominals 2x10 Attempted SLR and feet on ball with KTC but d/c due to pain.  09/30/21 NuStep L5 x 6 min 6" step ups  with HHA 1x10 B Standing hip abduction/flexion/extension 1# 1x10 each, verbal cues to keep hips neutral and upright posture 4 way resisted gait 20# 2x4 each, cues to keep hips out of ER STS with yellow ball 1x10 STS on airex 1x10 Leg extension 5# 2x10 Hamstring curls 20# 2x10  09/26/21 NuStep L5 X6 min Resisted side steps 20lb x3 each  Step ups 6in x10 each HHA x1 Lateral Step ups 6in x5 each Step ups 6in x5 each S2S holding yellow ball 2x10 Ball Squeezes 2x10 Hamstring Curls 20lb 3x10 Leg Extensions 5lb 2x10  Ball squeezes 2x10 Supine bridges x10 Hamstring stretches     09/24/21 Recumbent bike L1 x 6 min  MMT hip Flex R 4/10 L 4+/5, abd R 4/5 L4/5, add R3+/5  L3+/5, Knee Flex R4/5  L4+/5 Ext R 4-/5 L4-/5 S2S holding yellow ball 2x10 Step ups 6in x10 each HHA x1 Hamstring Curls 20lb 2x10 Leg Extensions 5lb 2x10  Ball squeezes 2x10    09/19/21 NuStep L5 x 6 min 6" step ups B with HHA 2x10 6" lateral step ups with HHA 1x10 B Balance on airex with HHA x 30 secs x2 Step ups from airex pad with HHA 1x5 B, increased fear of falling STS with 5# 1x10 STS on airex pad 1x10, with hands on thighs Resisted gait 20# forward and sideways x4 each, had to d/c due to left sided lower back pain Left hamstring stretch 2x15"  09/10/21 Nustep L4 x 6 mins B hamstring & piriformis stretch 3x10" each, tightness L>R Ambulate 1 lap Supine exercises performed with MH applied: Clams with yellow TB 2x10 Hip flex with yellow TB 2x10 Ball squeeze 2x10 KTC with red ball 2x10 Isometric TA with red ball 2x10  09/06/21 UBE L3 x 6 mins, 3 min forward, backwards Resisted gait 20# in all directions x 5 each side, x 3 forward/backward Steps ups on 4" box x 10 B with HHA S2S without UE's x10, attempted S2S on airex but discontinued due to pt fear of falling Balance on airex with HHA x 30 secs x2 Shoulder rows with red TB 2x10 Shoulder extension with red TB x10    PATIENT EDUCATION:  Education  details: BLT on going with tx and actvity Person educated: Patient Education method: Explanation Education comprehension: verbalized understanding, returned demonstration, and needs further education   HOME EXERCISE PROGRAM: [id number]- bridges, seated marches, seated clamshells, narrow BOS   ASSESSMENT:  CLINICAL IMPRESSION: Pt enters today stating she fell this morning when getting into the shower. States she rolled her R ankle but it isn't hurting  right now, she took Advil. Unknown how she fell but no LOC and didn't hit her head. No complaints of pain at beginning of session, however pt began to complain of pain to right hip and thigh pain after resisted gait. Pain seems to be muscular in nature. Pt unable to tolerate standing exercises as well as several supine exercises due to the pain. Several rest breaks due to fatiguing easily. Pt expressed fear of falling with attempted balance training. Finished session with lower level exercises on mat table to alleviate pain. Will assess pt pain next session. Pt states she contacted her orthopaedic this morning and notified them of her fall.   OBJECTIVE IMPAIRMENTS decreased activity tolerance, decreased balance, decreased cognition, decreased coordination, decreased endurance, decreased knowledge of condition, decreased knowledge of use of DME, decreased mobility, decreased strength, decreased safety awareness, postural dysfunction, obesity, and pain.   ACTIVITY LIMITATIONS community activity, laundry, yard work, shopping, and yard work.   PERSONAL FACTORS Age, Behavior pattern, Fitness, Past/current experiences, and Time since onset of injury/illness/exacerbation are also affecting patient's functional outcome.    REHAB POTENTIAL: Good  CLINICAL DECISION MAKING: Stable/uncomplicated    GOALS: Goals reviewed with patient? No  SHORT TERM GOALS: Target date: 08/27/2021    (Remove Blue Hyperlink)  Will be independent with HEP  Baseline: Goal  status: met  2.  Will be compliant with BLT precautions until released by MD  Baseline:  Goal status: met  3.  Will require no more than 2 rest breaks during PT session to show improved functional activity tolerance  Baseline:  Goal status:partial met- still requires rest breaks  4.  Will be able to name 3 ways to reduce fall risk at home and in the community  Baseline:  Goal status: met  LONG TERM GOALS: Target date: 09/24/2021  (Remove Blue Hyperlink)  MMT to improve by 1 grade in all weak groups  Baseline:  Goal status: Progressing  2.  Berg to be at least 47 to show reduced fall risk  Baseline:  Goal status: partial met  3.  Will be able to complete floor to stand transfers safely with no more than S  Baseline:  Goal status: ongoing   4.  Pain in lumbar spine to be no more than 1/10 at worst with functional task performance  Baseline: Fluctuates Goal status: 09/19/21 - Progressing, still having flare ups but was 0/10 today    PLAN: PT FREQUENCY: 2x/week  PT DURATION: 8 weeks  PLANNED INTERVENTIONS: Therapeutic exercises, Therapeutic activity, Neuromuscular re-education, Balance training, Gait training, Patient/Family education, Joint mobilization, Stair training, DME instructions, Cognitive remediation, Cryotherapy, Moist heat, Ultrasound, Ionotophoresis 30m/ml Dexamethasone, Manual therapy, and Re-evaluation.  PLAN FOR NEXT SESSION: Assess pain and continue strengthening and balance.   Crisol Muecke, SPTA  10/02/21 3:35 PM  10/02/2021, 3:35 PM

## 2021-10-07 ENCOUNTER — Other Ambulatory Visit: Payer: Self-pay | Admitting: Family Medicine

## 2021-10-07 ENCOUNTER — Ambulatory Visit: Payer: Medicare Other | Admitting: Physical Therapy

## 2021-10-07 DIAGNOSIS — Z1231 Encounter for screening mammogram for malignant neoplasm of breast: Secondary | ICD-10-CM

## 2021-10-09 ENCOUNTER — Encounter: Payer: Self-pay | Admitting: Physical Therapy

## 2021-10-09 ENCOUNTER — Ambulatory Visit: Payer: Medicare Other | Admitting: Physical Therapy

## 2021-10-09 DIAGNOSIS — R2681 Unsteadiness on feet: Secondary | ICD-10-CM

## 2021-10-09 DIAGNOSIS — M5459 Other low back pain: Secondary | ICD-10-CM

## 2021-10-09 DIAGNOSIS — M6281 Muscle weakness (generalized): Secondary | ICD-10-CM | POA: Diagnosis not present

## 2021-10-09 DIAGNOSIS — R296 Repeated falls: Secondary | ICD-10-CM

## 2021-10-09 DIAGNOSIS — R293 Abnormal posture: Secondary | ICD-10-CM

## 2021-10-09 NOTE — Therapy (Signed)
OUTPATIENT PHYSICAL THERAPY THORACOLUMBAR      Patient Name: Ann Graham MRN: [id number] DOB:09-12-1955, 66 y.o., female Today's Date: 10/09/2021   PT End of Session - 10/09/21 1302     Visit Number 14    Date for PT Re-Evaluation 10/25/21    PT Start Time 1300    PT Stop Time 1345    PT Time Calculation (min) 45 min    Activity Tolerance Patient tolerated treatment well    Behavior During Therapy [facility] for tasks assessed/performed               Past Medical History:  Diagnosis Date   Anxiety    Budd-Chiari syndrome ([place])    Elevated cholesterol    Hx of cardiovascular stress test    Lexiscan Myoview 4/14:  No ischemia, EF 75%.   Hx of echocardiogram    Echo 3/14: EF 55-60%, Gr 1 DD, mild LAE   Hypertension    Past Surgical History:  Procedure Laterality Date   DILATION AND CURETTAGE OF UTERUS     Patient Active Problem List   Diagnosis Date Noted   Age-related osteoporosis without current pathological fracture 07/31/2021   Wedge compression fracture of unspecified lumbar vertebra, initial encounter for closed fracture ([place]) 07/31/2021   Allergic rhinitis 05/10/2021   Edema 05/10/2021   Family history of malignant neoplasm of gastrointestinal tract 05/10/2021   Generalized anxiety disorder 05/10/2021   History of adenomatous polyp of colon 05/10/2021   History of diverticulitis 05/10/2021   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[place]) 05/10/2021   Urinary incontinence 05/10/2021   UTI (urinary tract infection) 04/18/2021   OSA (obstructive sleep apnea) 04/18/2021   Chronic diastolic CHF (congestive heart failure) ([place]) 04/17/2021   Closed compression fracture of body of L1 vertebra ([place]) 04/17/2021   Compression fracture of L1 lumbar vertebra ([place]) 04/16/2021   Frequent falls 04/16/2021   Morbid obesity ([place]) 04/16/2021   Hypocalcemia 04/16/2021   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   Pain in right hand 03/28/2021   Retention of urine 40/33/5331    Diastolic dysfunction 74/11/9276   Chest pain at rest 06/13/2012   Hypokalemia 06/13/2012   Cardiomegaly 06/13/2012   Idiopathic intracranial hypertension 09/03/2011   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   Empty sella ([place]) 02/20/2011   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   Papilledema 12/20/2010   Hypercholesterolemia 08/12/2008   OBESITY 08/12/2008   HYPERTENSION 08/12/2008    PCP: Jenny Reichmann   REFERRING PROVIDER: Consuella Lose   REFERRING DIAG: S32.010D (ICD-10-CM) - Wedge compression fracture of first lumbar vertebra, subsequent encounter for fracture with routine healing   THERAPY DIAG:  Muscle weakness (generalized)  Unsteadiness on feet  Other low back pain  Abnormal posture  Repeated falls  ONSET DATE: 07/15/2021   S/UBJEC/TIVE:  SUBJECTIVE STATEMENT:  "Ok"  PERTINENT HISTORY:  HTN, CHF, Chiari, L1 compression fracture, frequent falls  PAIN:  Are you having pain? 0/10   PRECAUTIONS: Back and Fall, HOH   WEIGHT BEARING RESTRICTIONS No  FALLS:  Has patient fallen in last 6 months? Yes, 4, last one on 10/02/21   PLOF: Independent, Independent with basic ADLs, Independent with gait, and Independent with transfers  PATIENT GOALS stop hurting    TREATMENT 10/09/21 NuStep L4 x 6 min Step ups 4in x 5 then 6in x5 HHA x1  Hamstring curls 25lb 2x12 Leg Extensions 5lb 2x12 S2S holding 6lb 2x12 Ball squeezes 2x10 Supine bridges x10 x5  Hooklying march 2x10 SLR x 12 each Isometric abs w/ Pball 2x10    10/02/21 NuStep L5 x 6 min 4 way resisted gait 20# x4 each Attempted step ups on 4" and on airex and d/c due to pain in right hip Ball squeeze 2x10 Clams with red TB 2x10 Isometric abdominals 2x10 Attempted SLR and feet on ball with KTC but d/c due to  pain.  09/30/21 NuStep L5 x 6 min 6" step ups with HHA 1x10 B Standing hip abduction/flexion/extension 1# 1x10 each, verbal cues to keep hips neutral and upright posture 4 way resisted gait 20# 2x4 each, cues to keep hips out of ER STS with yellow ball 1x10 STS on airex 1x10 Leg extension 5# 2x10 Hamstring curls 20# 2x10  09/26/21 NuStep L5 X6 min Resisted side steps 20lb x3 each  Step ups 6in x10 each HHA x1 Lateral Step ups 6in x5 each Step ups 6in x5 each S2S holding yellow ball 2x10 Ball Squeezes 2x10 Hamstring Curls 20lb 3x10 Leg Extensions 5lb 2x10  Ball squeezes 2x10 Supine bridges x10 Hamstring stretches     09/24/21 Recumbent bike L1 x 6 min  MMT hip Flex R 4/10 L 4+/5, abd R 4/5 L4/5, add R3+/5  L3+/5, Knee Flex R4/5  L4+/5 Ext R 4-/5 L4-/5 S2S holding yellow ball 2x10 Step ups 6in x10 each HHA x1 Hamstring Curls 20lb 2x10 Leg Extensions 5lb 2x10  Ball squeezes 2x10    09/19/21 NuStep L5 x 6 min 6" step ups B with HHA 2x10 6" lateral step ups with HHA 1x10 B Balance on airex with HHA x 30 secs x2 Step ups from airex pad with HHA 1x5 B, increased fear of falling STS with 5# 1x10 STS on airex pad 1x10, with hands on thighs Resisted gait 20# forward and sideways x4 each, had to d/c due to left sided lower back pain Left hamstring stretch 2x15"   PATIENT EDUCATION:  Education details: BLT on going with tx and actvity Person educated: Patient Education method: Explanation Education comprehension: verbalized understanding, returned demonstration, and needs further education   HOME EXERCISE PROGRAM: [id number]- bridges, seated marches, seated clamshells, narrow BOS   ASSESSMENT:  CLINICAL IMPRESSION: Pt enters today feeling well. She was able to progress to step ups with little UE use. 6 in step ups did appear to be more challenging. Cue to complete seated leg curls and extensions with full ROM. Some fatigue present with sit to stands. Pt had limited  elevation with supine bridges.  OBJECTIVE IMPAIRMENTS decreased activity tolerance, decreased balance, decreased cognition, decreased coordination, decreased endurance, decreased knowledge of condition, decreased knowledge of use of DME, decreased mobility, decreased strength, decreased safety awareness, postural dysfunction, obesity, and pain.   ACTIVITY LIMITATIONS community activity, laundry, yard work, shopping, and yard work.   PERSONAL FACTORS Age, Behavior pattern, Fitness, Past/current  experiences, and Time since onset of injury/illness/exacerbation are also affecting patient's functional outcome.    REHAB POTENTIAL: Good  CLINICAL DECISION MAKING: Stable/uncomplicated    GOALS: Goals reviewed with patient? No  SHORT TERM GOALS: Target date: 08/27/2021    (Remove Blue Hyperlink)  Will be independent with HEP  Baseline: Goal status: met  2.  Will be compliant with BLT precautions until released by MD  Baseline:  Goal status: met  3.  Will require no more than 2 rest breaks during PT session to show improved functional activity tolerance  Baseline:  Goal status:partial met- still requires rest breaks  4.  Will be able to name 3 ways to reduce fall risk at home and in the community  Baseline:  Goal status: met  LONG TERM GOALS: Target date: 09/24/2021  (Remove Blue Hyperlink)  MMT to improve by 1 grade in all weak groups  Baseline:  Goal status: Progressing  2.  Berg to be at least 47 to show reduced fall risk  Baseline:  Goal status: partial met  3.  Will be able to complete floor to stand transfers safely with no more than S  Baseline:  Goal status: ongoing   4.  Pain in lumbar spine to be no more than 1/10 at worst with functional task performance  Baseline: Fluctuates Goal status: 09/19/21 - Progressing, still having flare ups but was 0/10 today    PLAN: PT FREQUENCY: 2x/week  PT DURATION: 8 weeks  PLANNED INTERVENTIONS: Therapeutic exercises,  Therapeutic activity, Neuromuscular re-education, Balance training, Gait training, Patient/Family education, Joint mobilization, Stair training, DME instructions, Cognitive remediation, Cryotherapy, Moist heat, Ultrasound, Ionotophoresis 25m/ml Dexamethasone, Manual therapy, and Re-evaluation.  PLAN FOR NEXT SESSION: Assess pain and continue strengthening and balance.   RCheri Fowler PTA  10/09/21 1:03 PM  10/09/2021, 1:03 PM

## 2021-10-11 ENCOUNTER — Ambulatory Visit: Payer: Medicare Other | Admitting: Physical Therapy

## 2021-10-11 ENCOUNTER — Encounter: Payer: Self-pay | Admitting: Physical Therapy

## 2021-10-11 DIAGNOSIS — M5459 Other low back pain: Secondary | ICD-10-CM

## 2021-10-11 DIAGNOSIS — R296 Repeated falls: Secondary | ICD-10-CM

## 2021-10-11 DIAGNOSIS — M6281 Muscle weakness (generalized): Secondary | ICD-10-CM | POA: Diagnosis not present

## 2021-10-11 DIAGNOSIS — R2681 Unsteadiness on feet: Secondary | ICD-10-CM

## 2021-10-11 DIAGNOSIS — R293 Abnormal posture: Secondary | ICD-10-CM

## 2021-10-11 NOTE — Therapy (Signed)
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   Hx of echocardiogram    Echo 3/14: EF 55-60%, Gr 1 DD, mild LAE   Hypertension    Past Surgical History:  Procedure Laterality Date   DILATION AND CURETTAGE OF UTERUS     Patient Active Problem List   Diagnosis Date Noted   Age-related osteoporosis without current pathological fracture 07/31/2021   Wedge compression fracture of unspecified lumbar vertebra, initial encounter for closed fracture ([place]) 07/31/2021   Allergic rhinitis 05/10/2021   Edema 05/10/2021   Family history of malignant neoplasm of gastrointestinal tract 05/10/2021   Generalized anxiety disorder 05/10/2021   History of adenomatous polyp of colon 05/10/2021   History of diverticulitis 05/10/2021   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[place]) 05/10/2021   Urinary incontinence 05/10/2021   UTI (urinary tract infection) 04/18/2021   OSA (obstructive sleep apnea) 04/18/2021   Chronic diastolic CHF (congestive heart failure) ([place]) 04/17/2021   Closed compression fracture of body of L1 vertebra ([place]) 04/17/2021   Compression fracture of L1 lumbar vertebra ([place]) 04/16/2021   Frequent falls 04/16/2021   Morbid obesity ([place]) 04/16/2021   Hypocalcemia 04/16/2021   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   Pain in right hand 03/28/2021   Retention of urine 91/50/5697    Diastolic dysfunction 94/80/1655   Chest pain at rest 06/13/2012   Hypokalemia 06/13/2012   Cardiomegaly 06/13/2012   Idiopathic intracranial hypertension 09/03/2011   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   Empty sella ([place]) 02/20/2011   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  PERTINENT HISTORY:  HTN, CHF, Chiari, L1 compression fracture, frequent falls  PAIN:  Are you having pain? 0/10   PRECAUTIONS: Back and Fall, HOH   WEIGHT BEARING RESTRICTIONS No  FALLS:  Has patient fallen in last 6 months? Yes, 4, last one on 10/02/21   PLOF: Independent, Independent with basic ADLs, Independent with gait, and Independent with transfers  PATIENT GOALS stop hurting    TREATMENT 10/11/21 NuStep L3 x 6 minutes Hamstring curls 25# 2 x 12 reps Lef Extension 5# 2 x 12 reps Supine passive stretch to HS, SKTC, piriformis-30 sec each leg B side step on airex beam 5 x each direction B side step up onto and off 4" step 10 reps each side. STS with overhead press holding 6# ball, 2 x 10 reps  10/09/21 NuStep L4 x 6 min Step ups 4in x 5 then 6in x5 HHA x1  Hamstring curls 25lb 2x12 Leg Extensions 5lb 2x12 S2S holding 6lb 2x12 Ball squeezes 2x10 Supine bridges x10 x5   Hooklying march 2x10 SLR x 12 each Isometric abs w/ Pball 2x10  10/02/21 NuStep L5 x 6 min 4 way resisted gait 20# x4 each Attempted step ups on 4" and on airex and d/c due to pain in right hip Ball squeeze 2x10 Clams with red TB 2x10 Isometric abdominals 2x10 Attempted SLR and feet on ball with KTC but d/c due to pain.  09/30/21 NuStep L5 x 6 min 6" step ups with HHA 1x10 B Standing hip abduction/flexion/extension 1# 1x10 each, verbal cues to keep hips neutral and upright posture 4 way resisted gait 20# 2x4 each, cues to keep hips out of ER STS with yellow ball 1x10 STS on airex 1x10 Leg extension 5# 2x10 Hamstring curls 20# 2x10  09/26/21 NuStep L5 X6 min Resisted side steps 20lb x3 each  Step ups 6in x10 each HHA x1 Lateral Step ups 6in x5 each Step ups 6in x5 each S2S holding yellow ball 2x10 Ball Squeezes 2x10 Hamstring Curls 20lb 3x10 Leg Extensions 5lb 2x10  Ball squeezes 2x10 Supine bridges x10 Hamstring stretches   09/24/21 Recumbent bike L1 x 6 min  MMT hip Flex R 4/10 L 4+/5, abd R 4/5 L4/5, add R3+/5  L3+/5, Knee Flex R4/5  L4+/5 Ext R 4-/5 L4-/5 S2S holding yellow ball 2x10 Step ups 6in x10 each HHA x1 Hamstring Curls 20lb 2x10 Leg Extensions 5lb 2x10  Ball squeezes 2x10  09/19/21 NuStep L5 x 6 min 6" step ups B with HHA 2x10 6" lateral step ups with HHA 1x10 B Balance on airex with HHA x 30 secs x2 Step ups from airex pad with HHA 1x5 B, increased fear of falling STS with 5# 1x10 STS on airex pad 1x10, with hands on thighs Resisted gait 20# forward and sideways x4 each, had to d/c due to left sided lower back pain Left hamstring stretch 2x15"   PATIENT EDUCATION:  Education details: BLT on going with tx and actvity Person educated: Patient Education method: Explanation Education comprehension: verbalized understanding, returned demonstration, and needs further education   HOME EXERCISE PROGRAM: [id number]- bridges, seated marches, seated  clamshells, narrow BOS   ASSESSMENT:  CLINICAL IMPRESSION: Pt reports no new issues. Treatment focused on trunk and LE stabilization, progressed with increased challenge, also performed some passive stretch to hips for pain relief.  OBJECTIVE IMPAIRMENTS decreased activity tolerance, decreased balance, decreased cognition, decreased coordination, decreased endurance, decreased knowledge of condition, decreased knowledge of  use of DME, decreased mobility, decreased strength, decreased safety awareness, postural dysfunction, obesity, and pain.   ACTIVITY LIMITATIONS community activity, laundry, yard work, shopping, and yard work.   PERSONAL FACTORS Age, Behavior pattern, Fitness, Past/current experiences, and Time since onset of injury/illness/exacerbation are also affecting patient's functional outcome.    REHAB POTENTIAL: Good  CLINICAL DECISION MAKING: Stable/uncomplicated    GOALS: Goals reviewed with patient? No  SHORT TERM GOALS: Target date: 08/27/2021    (Remove Blue Hyperlink)  Will be independent with HEP  Baseline: Goal status: met  2.  Will be compliant with BLT precautions until released by MD  Baseline:  Goal status: met  3.  Will require no more than 2 rest breaks during PT session to show improved functional activity tolerance  Baseline:  Goal status:partial met- still requires rest breaks  4.  Will be able to name 3 ways to reduce fall risk at home and in the community  Baseline:  Goal status: met  LONG TERM GOALS: Target date: 09/24/2021  (Remove Blue Hyperlink)  MMT to improve by 1 grade in all weak groups  Baseline:  Goal status: Progressing  2.  Berg to be at least 47 to show reduced fall risk  Baseline:  Goal status: partial met  3.  Will be able to complete floor to stand transfers safely with no more than S  Baseline:  Goal status: ongoing   4.  Pain in lumbar spine to be no more than 1/10 at worst with functional task performance  Baseline:  Fluctuates Goal status: 09/19/21 - Progressing, still having flare ups but was 0/10 today    PLAN: PT FREQUENCY: 2x/week  PT DURATION: 8 weeks  PLANNED INTERVENTIONS: Therapeutic exercises, Therapeutic activity, Neuromuscular re-education, Balance training, Gait training, Patient/Family education, Joint mobilization, Stair training, DME instructions, Cognitive remediation, Cryotherapy, Moist heat, Ultrasound, Ionotophoresis 44m/ml Dexamethasone, Manual therapy, and Re-evaluation.  PLAN FOR NEXT SESSION: Assess pain and continue strengthening and balance.    SEthel RanaDPT 10/11/2021, 11:40 AM

## 2021-10-14 ENCOUNTER — Encounter: Payer: Self-pay | Admitting: Physical Therapy

## 2021-10-14 ENCOUNTER — Ambulatory Visit: Payer: Medicare Other | Admitting: Physical Therapy

## 2021-10-14 DIAGNOSIS — M6281 Muscle weakness (generalized): Secondary | ICD-10-CM

## 2021-10-14 DIAGNOSIS — R296 Repeated falls: Secondary | ICD-10-CM

## 2021-10-14 DIAGNOSIS — M5459 Other low back pain: Secondary | ICD-10-CM

## 2021-10-14 DIAGNOSIS — R2681 Unsteadiness on feet: Secondary | ICD-10-CM

## 2021-10-14 NOTE — Therapy (Signed)
OUTPATIENT PHYSICAL THERAPY THORACOLUMBAR      Patient Name: Ann Graham MRN: [id number] DOB:Mar 14, 1956, 66 y.o., female Today's Date: 10/14/2021   PT End of Session - 10/14/21 1345     Visit Number 16    Date for PT Re-Evaluation 10/25/21    PT Start Time 1345    PT Stop Time 1430    PT Time Calculation (min) 45 min    Activity Tolerance Patient tolerated treatment well    Behavior During Therapy [facility] for tasks assessed/performed                Past Medical History:  Diagnosis Date   Anxiety    Budd-Chiari syndrome ([facility])    Elevated cholesterol    Hx of cardiovascular stress test    Lexiscan Myoview 4/14:  No ischemia, EF 75%.   Hx of echocardiogram    Echo 3/14: EF 55-60%, Gr 1 DD, mild LAE   Hypertension    Past Surgical History:  Procedure Laterality Date   DILATION AND CURETTAGE OF UTERUS     Patient Active Problem List   Diagnosis Date Noted   Age-related osteoporosis without current pathological fracture 07/31/2021   Wedge compression fracture of unspecified lumbar vertebra, initial encounter for closed fracture ([place]) 07/31/2021   Allergic rhinitis 05/10/2021   Edema 05/10/2021   Family history of malignant neoplasm of gastrointestinal tract 05/10/2021   Generalized anxiety disorder 05/10/2021   History of adenomatous polyp of colon 05/10/2021   History of diverticulitis 05/10/2021   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[place]) 05/10/2021   Urinary incontinence 05/10/2021   UTI (urinary tract infection) 04/18/2021   OSA (obstructive sleep apnea) 04/18/2021   Chronic diastolic CHF (congestive heart failure) ([place]) 04/17/2021   Closed compression fracture of body of L1 vertebra ([place]) 04/17/2021   Compression fracture of L1 lumbar vertebra ([place]) 04/16/2021   Frequent falls 04/16/2021   Morbid obesity ([place]) 04/16/2021   Hypocalcemia 04/16/2021   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   Pain in right hand 03/28/2021   Retention of urine 81/19/1478    Diastolic dysfunction 29/56/2130   Chest pain at rest 06/13/2012   Hypokalemia 06/13/2012   Cardiomegaly 06/13/2012   Idiopathic intracranial hypertension 09/03/2011   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   Empty sella ([place]) 02/20/2011   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   Papilledema 12/20/2010   Hypercholesterolemia 08/12/2008   OBESITY 08/12/2008   HYPERTENSION 08/12/2008    PCP: Jenny Reichmann   REFERRING PROVIDER: Consuella Lose   REFERRING DIAG: S32.010D (ICD-10-CM) - Wedge compression fracture of first lumbar vertebra, subsequent encounter for fracture with routine healing   THERAPY DIAG:  Muscle weakness (generalized)  Unsteadiness on feet  Other low back pain  Repeated falls  ONSET DATE: 07/15/2021   S/UBJEC/TIVE:  SUBJECTIVE STATEMENT:  "Im doing ok" Sore from picking ups tree limbs  PERTINENT HISTORY:  HTN, CHF, Chiari, L1 compression fracture, frequent falls  PAIN:  Are you having pain? 0/10   PRECAUTIONS: Back and Fall, HOH   WEIGHT BEARING RESTRICTIONS No  FALLS:  Has patient fallen in last 6 months? Yes, 4, last one on 10/02/21   PLOF: Independent, Independent with basic ADLs, Independent with gait, and Independent with transfers  PATIENT GOALS stop hurting    TREATMENT 10/14/21 NuStep L3 x6 min Lateral step ups 4in x10 each some UE assist Hamstring curls 25# 2 x 12 reps Lef Extension 5# 2 x 12 reps STS with overhead press holding 6# ball, 2 x 10 reps    10/11/21 NuStep L3 x 6 minutes Hamstring curls 25# 2 x 12 reps Lef Extension 5# 2 x 12 reps Supine passive stretch to HS, SKTC, piriformis-30 sec each leg B side step on airex beam 5 x each direction B side step up onto and off 4" step 10 reps each side. STS with overhead press holding 4# ball, 2 x  10 reps  10/09/21 NuStep L4 x 6 min Step ups 4in x 5 then 6in x5 HHA x1  Hamstring curls 25lb 2x12 Leg Extensions 5lb 2x12 S2S holding 6lb 2x12 Ball squeezes 2x10 Supine bridges x10 x5  Hooklying march 2x10 SLR x 12 each Isometric abs w/ Pball 2x10  10/02/21 NuStep L5 x 6 min 4 way resisted gait 20# x4 each Attempted step ups on 4" and on airex and d/c due to pain in right hip Ball squeeze 2x10 Clams with red TB 2x10 Isometric abdominals 2x10 Attempted SLR and feet on ball with KTC but d/c due to pain.  09/30/21 NuStep L5 x 6 min 6" step ups with HHA 1x10 B Standing hip abduction/flexion/extension 1# 1x10 each, verbal cues to keep hips neutral and upright posture 4 way resisted gait 20# 2x4 each, cues to keep hips out of ER STS with yellow ball 1x10 STS on airex 1x10 Leg extension 5# 2x10 Hamstring curls 20# 2x10  09/26/21 NuStep L5 X6 min Resisted side steps 20lb x3 each  Step ups 6in x10 each HHA x1 Lateral Step ups 6in x5 each Step ups 6in x5 each S2S holding yellow ball 2x10 Ball Squeezes 2x10 Hamstring Curls 20lb 3x10 Leg Extensions 5lb 2x10  Ball squeezes 2x10 Supine bridges x10 Hamstring stretches   09/24/21 Recumbent bike L1 x 6 min  MMT hip Flex R 4/10 L 4+/5, abd R 4/5 L4/5, add R3+/5  L3+/5, Knee Flex R4/5  L4+/5 Ext R 4-/5 L4-/5 S2S holding yellow ball 2x10 Step ups 6in x10 each HHA x1 Hamstring Curls 20lb 2x10 Leg Extensions 5lb 2x10  Ball squeezes 2x10    PATIENT EDUCATION:  Education details: BLT on going with tx and actvity Person educated: Patient Education method: Explanation Education comprehension: verbalized understanding, returned demonstration, and needs further education   HOME EXERCISE PROGRAM: [id number]- bridges, seated marches, seated clamshells, narrow BOS   ASSESSMENT:  CLINICAL IMPRESSION: Pt reports no new issues. Treatment focused on trunk and LE stabilization, progressed with increased challenge. Tactile cues to prevent  trunk flexion with shoulder Ext. Increase fatigue with sit to stands.  OBJECTIVE IMPAIRMENTS decreased activity tolerance, decreased balance, decreased cognition, decreased coordination, decreased endurance, decreased knowledge of condition, decreased knowledge of use of DME, decreased mobility, decreased strength, decreased safety awareness, postural dysfunction, obesity, and pain.   ACTIVITY LIMITATIONS community activity, laundry, yard work, shopping, and yard  work.   PERSONAL FACTORS Age, Behavior pattern, Fitness, Past/current experiences, and Time since onset of injury/illness/exacerbation are also affecting patient's functional outcome.    REHAB POTENTIAL: Good  CLINICAL DECISION MAKING: Stable/uncomplicated    GOALS: Goals reviewed with patient? No  SHORT TERM GOALS: Target date: 08/27/2021    (Remove Blue Hyperlink)  Will be independent with HEP  Baseline: Goal status: met  2.  Will be compliant with BLT precautions until released by MD  Baseline:  Goal status: met  3.  Will require no more than 2 rest breaks during PT session to show improved functional activity tolerance  Baseline:  Goal status:partial met- still requires rest breaks  4.  Will be able to name 3 ways to reduce fall risk at home and in the community  Baseline:  Goal status: met  LONG TERM GOALS: Target date: 09/24/2021  (Remove Blue Hyperlink)  MMT to improve by 1 grade in all weak groups  Baseline:  Goal status: Progressing  2.  Berg to be at least 47 to show reduced fall risk  Baseline:  Goal status: partial met  3.  Will be able to complete floor to stand transfers safely with no more than S  Baseline:  Goal status: ongoing   4.  Pain in lumbar spine to be no more than 1/10 at worst with functional task performance  Baseline: Fluctuates Goal status: 09/19/21 - Progressing, still having flare ups but was 0/10 today    PLAN: PT FREQUENCY: 2x/week  PT DURATION: 8 weeks  PLANNED  INTERVENTIONS: Therapeutic exercises, Therapeutic activity, Neuromuscular re-education, Balance training, Gait training, Patient/Family education, Joint mobilization, Stair training, DME instructions, Cognitive remediation, Cryotherapy, Moist heat, Ultrasound, Ionotophoresis 82m/ml Dexamethasone, Manual therapy, and Re-evaluation.  PLAN FOR NEXT SESSION: Assess pain and continue strengthening and balance.    SEthel RanaDPT 10/14/2021, 1:48 PM

## 2021-10-16 ENCOUNTER — Ambulatory Visit: Payer: Medicare Other | Attending: Neurosurgery | Admitting: Physical Therapy

## 2021-10-16 ENCOUNTER — Encounter: Payer: Self-pay | Admitting: Physical Therapy

## 2021-10-16 DIAGNOSIS — R2681 Unsteadiness on feet: Secondary | ICD-10-CM | POA: Diagnosis present

## 2021-10-16 DIAGNOSIS — M5459 Other low back pain: Secondary | ICD-10-CM | POA: Diagnosis present

## 2021-10-16 DIAGNOSIS — R293 Abnormal posture: Secondary | ICD-10-CM | POA: Diagnosis present

## 2021-10-16 DIAGNOSIS — R296 Repeated falls: Secondary | ICD-10-CM | POA: Diagnosis present

## 2021-10-16 DIAGNOSIS — M6281 Muscle weakness (generalized): Secondary | ICD-10-CM | POA: Diagnosis present

## 2021-10-16 NOTE — Therapy (Signed)
OUTPATIENT PHYSICAL THERAPY THORACOLUMBAR      Patient Name: Ann Graham MRN: [id number] DOB:1955/09/27, 66 y.o., female Today's Date: 10/16/2021   PT End of Session - 10/16/21 1342     Visit Number 17    Date for PT Re-Evaluation 10/25/21    PT Start Time 1345    PT Stop Time 1430    PT Time Calculation (min) 45 min    Activity Tolerance Patient tolerated treatment well    Behavior During Therapy [facility] for tasks assessed/performed                Past Medical History:  Diagnosis Date   Anxiety    Budd-Chiari syndrome (HCC)    Elevated cholesterol    Hx of cardiovascular stress test    Lexiscan Myoview 4/14:  No ischemia, EF 75%.   Hx of echocardiogram    Echo 3/14: EF 55-60%, Gr 1 DD, mild LAE   Hypertension    Past Surgical History:  Procedure Laterality Date   DILATION AND CURETTAGE OF UTERUS     Patient Active Problem List   Diagnosis Date Noted   Age-related osteoporosis without current pathological fracture 07/31/2021   Wedge compression fracture of unspecified lumbar vertebra, initial encounter for closed fracture ([place]) 07/31/2021   Allergic rhinitis 05/10/2021   Edema 05/10/2021   Family history of malignant neoplasm of gastrointestinal tract 05/10/2021   Generalized anxiety disorder 05/10/2021   History of adenomatous polyp of colon 05/10/2021   History of diverticulitis 05/10/2021   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[place]) 05/10/2021   Urinary incontinence 05/10/2021   UTI (urinary tract infection) 04/18/2021   OSA (obstructive sleep apnea) 04/18/2021   Chronic diastolic CHF (congestive heart failure) ([place]) 04/17/2021   Closed compression fracture of body of L1 vertebra ([place]) 04/17/2021   Compression fracture of L1 lumbar vertebra ([place]) 04/16/2021   Frequent falls 04/16/2021   Morbid obesity ([place]) 04/16/2021   Hypocalcemia 04/16/2021   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   Pain in right hand 03/28/2021   Retention of urine 40/98/1191    Diastolic dysfunction 47/82/9562   Chest pain at rest 06/13/2012   Hypokalemia 06/13/2012   Cardiomegaly 06/13/2012   Idiopathic intracranial hypertension 09/03/2011   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   Empty sella ([place]) 02/20/2011   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 0/10   PRECAUTIONS: Back and Fall, HOH   WEIGHT BEARING RESTRICTIONS No  FALLS:  Has patient fallen in last 6 months? Yes, 4, last one on 10/02/21   PLOF: Independent, Independent with basic ADLs, Independent with gait, and Independent with transfers  PATIENT GOALS stop hurting    TREATMENT 10/16/21 NuStep L5 x 6 min Hamstring curls 25# 2 x 12 reps Lef Extension 5# 2 x 12 reps Side step over WaTE bar 2x5  S2S OHP yellow ball 2x10 Shoulder Row blue 2x10      10/14/21 NuStep L3 x6 min Lateral step ups 4in x10 each some UE assist Hamstring curls 25# 2 x 12 reps Lef Extension 5# 2 x 12 reps STS with overhead press holding 6# ball, 2 x 10 reps   10/11/21 NuStep L3 x 6 minutes Hamstring curls 25# 2 x 12 reps Lef Extension 5# 2 x 12 reps Supine passive stretch to HS, SKTC, piriformis-30 sec each leg B side step  on airex beam 5 x each direction B side step up onto and off 4" step 10 reps each side. STS with overhead press holding 4# ball, 2 x 10 reps  10/09/21 NuStep L4 x 6 min Step ups 4in x 5 then 6in x5 HHA x1  Hamstring curls 25lb 2x12 Leg Extensions 5lb 2x12 S2S holding 6lb 2x12 Ball squeezes 2x10 Supine bridges x10 x5  Hooklying march 2x10 SLR x 12 each Isometric abs w/ Pball 2x10  10/02/21 NuStep L5 x 6 min 4 way resisted gait 20# x4 each Attempted step ups on 4" and on airex and d/c due to pain in right hip Ball squeeze 2x10 Clams with red TB 2x10 Isometric abdominals 2x10 Attempted SLR and feet on ball with KTC but d/c due to pain.  09/30/21 NuStep L5 x 6 min 6" step ups with HHA 1x10 B Standing hip abduction/flexion/extension 1# 1x10 each, verbal cues to keep hips neutral and upright posture 4 way resisted gait 20# 2x4 each, cues to keep hips out of ER STS with yellow ball 1x10 STS on airex 1x10 Leg extension 5# 2x10 Hamstring curls 20# 2x10  09/26/21 NuStep L5 X6 min Resisted side steps 20lb x3 each  Step ups 6in x10 each HHA x1 Lateral Step ups 6in x5 each Step ups 6in x5 each S2S holding yellow ball 2x10 Ball Squeezes 2x10 Hamstring Curls 20lb 3x10 Leg Extensions 5lb 2x10  Ball squeezes 2x10 Supine bridges x10 Hamstring stretches    PATIENT EDUCATION:  Education details: BLT on going with tx and actvity Person educated: Patient Education method: Explanation Education comprehension: verbalized understanding, returned demonstration, and needs further education   HOME EXERCISE PROGRAM: [id number]- bridges, seated marches, seated clamshells, narrow BOS   ASSESSMENT:  CLINICAL IMPRESSION: Pt reports no new issues.Pt very hesitant with side step over WaTE bar. Cue to increase step length with side ep over bar to have space for the following LE. No reports of pain during session Cue needed to redirect and stay on task   OBJECTIVE IMPAIRMENTS decreased activity  tolerance, decreased balance, decreased cognition, decreased coordination, decreased endurance, decreased knowledge of condition, decreased knowledge of use of DME, decreased mobility, decreased strength, decreased safety awareness, postural dysfunction, obesity, and pain.   ACTIVITY LIMITATIONS community activity, laundry, yard work, shopping, and yard work.   PERSONAL FACTORS Age, Behavior pattern, Fitness, Past/current experiences, and  Time since onset of injury/illness/exacerbation are also affecting patient's functional outcome.    REHAB POTENTIAL: Good  CLINICAL DECISION MAKING: Stable/uncomplicated    GOALS: Goals reviewed with patient? No  SHORT TERM GOALS: Target date: 08/27/2021    (Remove Blue Hyperlink)  Will be independent with HEP  Baseline: Goal status: met  2.  Will be compliant with BLT precautions until released by MD  Baseline:  Goal status: met  3.  Will require no more than 2 rest breaks during PT session to show improved functional activity tolerance  Baseline:  Goal status:partial met- still requires rest breaks  4.  Will be able to name 3 ways to reduce fall risk at home and in the community  Baseline:  Goal status: met  LONG TERM GOALS: Target date: 09/24/2021  (Remove Blue Hyperlink)  MMT to improve by 1 grade in all weak groups  Baseline:  Goal status: Progressing  2.  Berg to be at least 47 to show reduced fall risk  Baseline:  Goal status: partial met  3.  Will be able to complete floor to stand transfers safely with no more than S  Baseline:  Goal status: ongoing   4.  Pain in lumbar spine to be no more than 1/10 at worst with functional task performance  Baseline: Fluctuates Goal status: 09/19/21 - Progressing, still having flare ups but was 0/10 today    PLAN: PT FREQUENCY: 2x/week  PT DURATION: 8 weeks  PLANNED INTERVENTIONS: Therapeutic exercises, Therapeutic activity, Neuromuscular re-education, Balance training, Gait training,  Patient/Family education, Joint mobilization, Stair training, DME instructions, Cognitive remediation, Cryotherapy, Moist heat, Ultrasound, Ionotophoresis 50m/ml Dexamethasone, Manual therapy, and Re-evaluation.  PLAN FOR NEXT SESSION: Assess pain and continue strengthening and balance.    SEthel RanaDPT 10/16/2021, 1:43 PM

## 2021-10-21 ENCOUNTER — Encounter: Payer: Self-pay | Admitting: Physical Therapy

## 2021-10-21 ENCOUNTER — Ambulatory Visit: Payer: Medicare Other | Admitting: Physical Therapy

## 2021-10-21 DIAGNOSIS — R296 Repeated falls: Secondary | ICD-10-CM

## 2021-10-21 DIAGNOSIS — M6281 Muscle weakness (generalized): Secondary | ICD-10-CM | POA: Diagnosis not present

## 2021-10-21 DIAGNOSIS — R2681 Unsteadiness on feet: Secondary | ICD-10-CM

## 2021-10-21 DIAGNOSIS — M5459 Other low back pain: Secondary | ICD-10-CM

## 2021-10-21 NOTE — Therapy (Signed)
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   Hx of echocardiogram    Echo 3/14: EF 55-60%, Gr 1 DD, mild LAE   Hypertension    Past Surgical History:  Procedure Laterality Date   DILATION AND CURETTAGE OF UTERUS     Patient Active Problem List   Diagnosis Date Noted   Age-related osteoporosis without current pathological fracture 07/31/2021   Wedge compression fracture of unspecified lumbar vertebra, initial encounter for closed fracture ([place]) 07/31/2021   Allergic rhinitis 05/10/2021   Edema 05/10/2021   Family history of malignant neoplasm of gastrointestinal tract 05/10/2021   Generalized anxiety disorder 05/10/2021   History of adenomatous polyp of colon 05/10/2021   History of diverticulitis 05/10/2021   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[place]) 05/10/2021   Urinary incontinence 05/10/2021   UTI (urinary tract infection) 04/18/2021   OSA (obstructive sleep apnea) 04/18/2021   Chronic diastolic CHF (congestive heart failure) ([place]) 04/17/2021   Closed compression fracture of body of L1 vertebra ([place]) 04/17/2021   Compression fracture of L1 lumbar vertebra ([place]) 04/16/2021   Frequent falls 04/16/2021   Morbid obesity ([place]) 04/16/2021   Hypocalcemia 04/16/2021   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   Pain in right hand 03/28/2021   Retention of urine 30/13/1438    Diastolic dysfunction 88/75/7972   Chest pain at rest 06/13/2012   Hypokalemia 06/13/2012   Cardiomegaly 06/13/2012   Idiopathic intracranial hypertension 09/03/2011   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   Empty sella ([place]) 02/20/2011   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[place]) 02/10/2011   Papilledema 12/20/2010   Hypercholesterolemia 08/12/2008   OBESITY 08/12/2008   HYPERTENSION 08/12/2008    PCP: Jenny Reichmann   REFERRING PROVIDER: Consuella Lose   REFERRING DIAG: S32.010D (ICD-10-CM) - Wedge compression fracture of first lumbar vertebra, subsequent encounter for fracture with routine healing   THERAPY DIAG:  Muscle weakness (generalized)  Unsteadiness on feet  Other low back pain  Repeated falls  ONSET DATE: 07/15/2021   S/UBJEC/TIVE:  SUBJECTIVE STATEMENT:  "Im ok"  PERTINENT HISTORY:  HTN, CHF, Chiari, L1 compression fracture, frequent falls  PAIN:  Are you having pain? 0/10   PRECAUTIONS: Back and Fall, HOH    FALLS:  Has patient fallen in last 6 months? Yes, 4, last one on 10/02/21   PLOF: Independent, Independent with basic ADLs, Independent with gait, and Independent with transfers  PATIENT GOALS stop hurting    TREATMENT 10/21/21 NuStep L5 x 6 min Gait outside around front back island  Hamstring curls 20# 2 x 15 reps Leg Extension 5# 2 x 12 reps Shoulder Ext 5lb 2x10 S2S OHP yellow ball 2x10 Side step on BOSU x3 difficult for PT   10/16/21 NuStep L5 x 6 min Hamstring curls 25# 2 x 12 reps Lef Extension 5# 2 x 12 reps Side step over WaTE bar 2x5  S2S OHP yellow ball 2x10 Shoulder Row blue 2x10  10/14/21 NuStep L3 x6 min Lateral step ups 4in x10 each some UE assist Hamstring curls 25# 2 x 12 reps Lef Extension 5# 2 x 12 reps STS with overhead press holding 6# ball, 2 x  10 reps   10/11/21 NuStep L3 x 6 minutes Hamstring curls 25# 2 x 12 reps Lef Extension 5# 2 x 12 reps Supine passive stretch to HS, SKTC, piriformis-30 sec each leg B side step on airex beam 5 x each direction B side step up onto and off 4" step 10 reps each side. STS with overhead press holding 4# ball, 2 x 10 reps  10/09/21 NuStep L4 x 6 min Step ups 4in x 5 then 6in x5 HHA x1  Hamstring curls 25lb 2x12 Leg Extensions 5lb 2x12 S2S holding 6lb 2x12 Ball squeezes 2x10 Supine bridges x10 x5  Hooklying march 2x10 SLR x 12 each Isometric abs w/ Pball 2x10  10/02/21 NuStep L5 x 6 min 4 way resisted gait 20# x4 each Attempted step ups on 4" and on airex and d/c due to pain in right hip Ball squeeze 2x10 Clams with red TB 2x10 Isometric abdominals 2x10 Attempted SLR and feet on ball with KTC but d/c due to pain.  09/30/21 NuStep L5 x 6 min 6" step ups with HHA 1x10 B Standing hip abduction/flexion/extension 1# 1x10 each, verbal cues to keep hips neutral and upright posture 4 way resisted gait 20# 2x4 each, cues to keep hips out of ER STS with yellow ball 1x10 STS on airex 1x10 Leg extension 5# 2x10 Hamstring curls 20# 2x10   PATIENT EDUCATION:  Education details: BLT on going with tx and actvity Person educated: Patient Education method: Explanation Education comprehension: verbalized understanding, returned demonstration, and needs further education   HOME EXERCISE PROGRAM: [id number]- bridges, seated marches, seated clamshells, narrow BOS   ASSESSMENT:  CLINICAL IMPRESSION: Pt reports no new issues. Increase fatigue with outdoor ambulation and sit to stands Cue for full ROM needed with leg curls and extensions. Some postural fatigue noted with shoulder extensions and rows.  OBJECTIVE IMPAIRMENTS decreased activity tolerance, decreased balance, decreased cognition, decreased coordination, decreased endurance, decreased knowledge of condition, decreased knowledge of use  of DME, decreased mobility, decreased strength, decreased safety awareness, postural dysfunction, obesity, and pain.   ACTIVITY LIMITATIONS community activity, laundry, yard work, shopping, and yard work.   PERSONAL FACTORS Age, Behavior pattern, Fitness, Past/current experiences, and Time since onset of injury/illness/exacerbation are also affecting patient's functional outcome.    REHAB POTENTIAL: Good  CLINICAL DECISION MAKING: Stable/uncomplicated    GOALS: Goals reviewed with patient?  No  SHORT TERM GOALS: Target date: 08/27/2021    (Remove Blue Hyperlink)  Will be independent with HEP  Baseline: Goal status: met  2.  Will be compliant with BLT precautions until released by MD  Baseline:  Goal status: met  3.  Will require no more than 2 rest breaks during PT session to show improved functional activity tolerance  Baseline:  Goal status:partial met- still requires rest breaks  4.  Will be able to name 3 ways to reduce fall risk at home and in the community  Baseline:  Goal status: met  LONG TERM GOALS: Target date: 09/24/2021  (Remove Blue Hyperlink)  MMT to improve by 1 grade in all weak groups  Baseline:  Goal status: Progressing  2.  Berg to be at least 47 to show reduced fall risk  Baseline:  Goal status: partial met  3.  Will be able to complete floor to stand transfers safely with no more than S  Baseline:  Goal status: ongoing   4.  Pain in lumbar spine to be no more than 1/10 at worst with functional task performance  Baseline: Fluctuates Goal status: 09/19/21 - Progressing, still having flare ups but was 0/10 today    PLAN: PT FREQUENCY: 2x/week  PT DURATION: 8 weeks  PLANNED INTERVENTIONS: Therapeutic exercises, Therapeutic activity, Neuromuscular re-education, Balance training, Gait training, Patient/Family education, Joint mobilization, Stair training, DME instructions, Cognitive remediation, Cryotherapy, Moist heat, Ultrasound, Ionotophoresis  77m/ml Dexamethasone, Manual therapy, and Re-evaluation.  PLAN FOR NEXT SESSION: Assess pain and continue strengthening and balance.    SEthel RanaDPT 10/21/2021, 1:44 PM

## 2021-10-23 ENCOUNTER — Encounter: Payer: Self-pay | Admitting: Physical Therapy

## 2021-10-23 ENCOUNTER — Ambulatory Visit: Payer: Medicare Other | Admitting: Physical Therapy

## 2021-10-23 DIAGNOSIS — M6281 Muscle weakness (generalized): Secondary | ICD-10-CM

## 2021-10-23 DIAGNOSIS — M5459 Other low back pain: Secondary | ICD-10-CM

## 2021-10-23 DIAGNOSIS — R293 Abnormal posture: Secondary | ICD-10-CM

## 2021-10-23 DIAGNOSIS — R296 Repeated falls: Secondary | ICD-10-CM

## 2021-10-23 DIAGNOSIS — R2681 Unsteadiness on feet: Secondary | ICD-10-CM

## 2021-10-23 NOTE — Therapy (Signed)
OUTPATIENT PHYSICAL THERAPY THORACOLUMBAR      Patient Name: Lesta Limbert MRN: [id number] DOB:[birth year], 66 y.o., female Today's Date: 10/23/2021   PT End of Session - 10/23/21 1344     Visit Number 18    Date for PT Re-Evaluation 10/25/21    Authorization Type M[employer] and [employer]    PT Start Time 1345    PT Stop Time 1430    PT Time Calculation (min) 45 min    Activity Tolerance Patient tolerated treatment well    Behavior During Therapy [facility] for tasks assessed/performed                Past Medical History:  Diagnosis Date   Anxiety    Budd-Chiari syndrome ([facility])    Elevated cholesterol    Hx of cardiovascular stress test    Lexiscan Myoview 4/14:  No ischemia, EF 75%.   Hx of echocardiogram    Echo 3/14: EF 55-60%, Gr 1 DD, mild LAE   Hypertension    Past Surgical History:  Procedure Laterality Date   DILATION AND CURETTAGE OF UTERUS     Patient Active Problem List   Diagnosis Date Noted   Age-related osteoporosis without current pathological fracture 07/31/2021   Wedge compression fracture of unspecified lumbar vertebra, initial encounter for closed fracture ([place]) 07/31/2021   Allergic rhinitis 05/10/2021   Edema 05/10/2021   Family history of malignant neoplasm of gastrointestinal tract 05/10/2021   Generalized anxiety disorder 05/10/2021   History of adenomatous polyp of colon 05/10/2021   History of diverticulitis 05/10/2021   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[place]) 05/10/2021   Urinary incontinence 05/10/2021   UTI (urinary tract infection) 04/18/2021   OSA (obstructive sleep apnea) 04/18/2021   Chronic diastolic CHF (congestive heart failure) ([place]) 04/17/2021   Closed compression fracture of body of L1 vertebra ([place]) 04/17/2021   Compression fracture of L1 lumbar vertebra ([place]) 04/16/2021   Frequent falls 04/16/2021   Morbid obesity ([place]) 04/16/2021   Hypocalcemia 04/16/2021   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   Pain in right hand 03/28/2021    Retention of urine 35/67/0141   Diastolic dysfunction 05/15/3141   Chest pain at rest 06/13/2012   Hypokalemia 06/13/2012   Cardiomegaly 06/13/2012   Idiopathic intracranial hypertension 09/03/2011   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   Empty sella ([place]) 02/20/2011   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   Papilledema 12/20/2010   Hypercholesterolemia 08/12/2008   OBESITY 08/12/2008   HYPERTENSION 08/12/2008    PCP: Jenny Reichmann   REFERRING PROVIDER: Consuella Lose   REFERRING DIAG: S32.010D (ICD-10-CM) - Wedge compression fracture of first lumbar vertebra, subsequent encounter for fracture with routine healing   THERAPY DIAG:  Unsteadiness on feet  Muscle weakness (generalized)  Other low back pain  Repeated falls  Abnormal posture  ONSET DATE: 07/15/2021   S/UBJEC/TIVE:  SUBJECTIVE STATEMENT:  "good"  PERTINENT HISTORY:  HTN, CHF, Chiari, L1 compression fracture, frequent falls  PAIN:  Are you having pain? 0/10   PRECAUTIONS: Back and Fall, HOH    FALLS:  Has patient fallen in last 6 months? Yes, 4, last one on 10/02/21   PLOF: Independent, Independent with basic ADLs, Independent with gait, and Independent with transfers  PATIENT GOALS stop hurting    TREATMENT 10/23/21 NuStep L3 x 6 min Hamstring curls 20# 2 x 15 reps Leg Extension 5# 2 x 12 reps Resisted gait 30lb 4 way x 3 each S2S OHP yellow ball 2x10   10/21/21 NuStep L5 x 6 min Gait outside around front back island  Hamstring curls 20# 2 x 15 reps Leg Extension 5# 2 x 12 reps Shoulder Ext 5lb 2x10 S2S OHP yellow ball 2x10 Side step on BOSU x3 difficult for PT   10/16/21 NuStep L5 x 6 min Hamstring curls 25# 2 x 12 reps Lef Extension 5# 2 x 12 reps Side step over WaTE bar 2x5  S2S OHP yellow ball  2x10 Shoulder Row blue 2x10  10/14/21 NuStep L3 x6 min Lateral step ups 4in x10 each some UE assist Hamstring curls 25# 2 x 12 reps Lef Extension 5# 2 x 12 reps STS with overhead press holding 6# ball, 2 x 10 reps   10/11/21 NuStep L3 x 6 minutes Hamstring curls 25# 2 x 12 reps Lef Extension 5# 2 x 12 reps Supine passive stretch to HS, SKTC, piriformis-30 sec each leg B side step on airex beam 5 x each direction B side step up onto and off 4" step 10 reps each side. STS with overhead press holding 4# ball, 2 x 10 reps  10/09/21 NuStep L4 x 6 min Step ups 4in x 5 then 6in x5 HHA x1  Hamstring curls 25lb 2x12 Leg Extensions 5lb 2x12 S2S holding 6lb 2x12 Ball squeezes 2x10 Supine bridges x10 x5  Hooklying march 2x10 SLR x 12 each Isometric abs w/ Pball 2x10  10/02/21 NuStep L5 x 6 min 4 way resisted gait 20# x4 each Attempted step ups on 4" and on airex and d/c due to pain in right hip Ball squeeze 2x10 Clams with red TB 2x10 Isometric abdominals 2x10 Attempted SLR and feet on ball with KTC but d/c due to pain.  PATIENT EDUCATION:  Education details: BLT on going with tx and actvity Person educated: Patient Education method: Explanation Education comprehension: verbalized understanding, returned demonstration, and needs further education   HOME EXERCISE PROGRAM: [id number]- bridges, seated marches, seated clamshells, narrow BOS   ASSESSMENT:  CLINICAL IMPRESSION: Pt reports no new issues. Al interventions completed well. Some LE burnign reported with resisted side steps. Pt reports being pleased with her current functional status, and reports that she will be going to the gym.  OBJECTIVE IMPAIRMENTS decreased activity tolerance, decreased balance, decreased cognition, decreased coordination, decreased endurance, decreased knowledge of condition, decreased knowledge of use of DME, decreased mobility, decreased strength, decreased safety awareness, postural dysfunction,  obesity, and pain.   ACTIVITY LIMITATIONS community activity, laundry, yard work, shopping, and yard work.   PERSONAL FACTORS Age, Behavior pattern, Fitness, Past/current experiences, and Time since onset of injury/illness/exacerbation are also affecting patient's functional outcome.    REHAB POTENTIAL: Good  CLINICAL DECISION MAKING: Stable/uncomplicated    GOALS: Goals reviewed with patient? No  SHORT TERM GOALS: Target date: 08/27/2021    (Remove Blue Hyperlink)  Will be independent with HEP  Baseline: Goal status:  met  2.  Will be compliant with BLT precautions until released by MD  Baseline:  Goal status: met  3.  Will require no more than 2 rest breaks during PT session to show improved functional activity tolerance  Baseline:  Goal status:partial met- still requires rest breaks  4.  Will be able to name 3 ways to reduce fall risk at home and in the community  Baseline:  Goal status: met  LONG TERM GOALS: Target date: 09/24/2021  (Remove Blue Hyperlink)  MMT to improve by 1 grade in all weak groups  Baseline:  Goal status: met  2.  Berg to be at least 82 to show reduced fall risk  Baseline:  Goal status: partial met  3.  Will be able to complete floor to stand transfers safely with no more than S  Baseline:  Goal status: not met   4.  Pain in lumbar spine to be no more than 1/10 at worst with functional task performance  Baseline: Fluctuates Goal status: met    PLAN: PT FREQUENCY: 2x/week  PT DURATION: 8 weeks  PLANNED INTERVENTIONS: Therapeutic exercises, Therapeutic activity, Neuromuscular re-education, Balance training, Gait training, Patient/Family education, Joint mobilization, Stair training, DME instructions, Cognitive remediation, Cryotherapy, Moist heat, Ultrasound, Ionotophoresis 67m/ml Dexamethasone, Manual therapy, and Re-evaluation.  PLAN FOR NEXT SESSION: D/C PT.  PHYSICAL THERAPY DISCHARGE SUMMARY  Visits from Start of Care:  18 Patient agrees to discharge. Patient goals were partially met. Patient is being discharged due to being pleased with the current functional level.     RCheri FowlerPTA 10/23/2021, 1:45 PM

## 2021-11-04 ENCOUNTER — Ambulatory Visit
Admission: RE | Admit: 2021-11-04 | Discharge: 2021-11-04 | Disposition: A | Discharge status: Home / Self Care | Payer: Medicare Other | Source: Ambulatory Visit | Attending: Family Medicine | Admitting: Family Medicine

## 2021-11-04 DIAGNOSIS — Z1231 Encounter for screening mammogram for malignant neoplasm of breast: Secondary | ICD-10-CM

## 2022-01-29 ENCOUNTER — Other Ambulatory Visit: Payer: Self-pay | Admitting: Family Medicine

## 2022-01-29 DIAGNOSIS — M810 Age-related osteoporosis without current pathological fracture: Secondary | ICD-10-CM

## 2022-01-30 ENCOUNTER — Encounter: Payer: Self-pay | Admitting: Family Medicine

## 2022-07-17 ENCOUNTER — Other Ambulatory Visit: Payer: Medicare Other

## 2022-10-10 ENCOUNTER — Other Ambulatory Visit: Payer: Self-pay | Admitting: Family Medicine

## 2022-10-10 DIAGNOSIS — Z1231 Encounter for screening mammogram for malignant neoplasm of breast: Secondary | ICD-10-CM

## 2022-11-07 ENCOUNTER — Ambulatory Visit
Admission: RE | Admit: 2022-11-07 | Discharge: 2022-11-07 | Disposition: A | Discharge status: Home / Self Care | Payer: Medicare Other | Source: Ambulatory Visit | Attending: Family Medicine | Admitting: Family Medicine

## 2022-11-07 DIAGNOSIS — Z1231 Encounter for screening mammogram for malignant neoplasm of breast: Secondary | ICD-10-CM

## 2023-01-12 ENCOUNTER — Ambulatory Visit
Admission: RE | Admit: 2023-01-12 | Discharge: 2023-01-12 | Disposition: A | Discharge status: Home / Self Care | Payer: Medicare Other | Source: Ambulatory Visit | Attending: Family Medicine | Admitting: Family Medicine

## 2023-01-12 DIAGNOSIS — M810 Age-related osteoporosis without current pathological fracture: Secondary | ICD-10-CM

## 2023-05-26 DIAGNOSIS — H3581 Retinal edema: Secondary | ICD-10-CM | POA: Insufficient documentation

## 2023-07-09 ENCOUNTER — Ambulatory Visit (INDEPENDENT_AMBULATORY_CARE_PROVIDER_SITE_OTHER)

## 2023-07-09 ENCOUNTER — Ambulatory Visit
Admission: EM | Admit: 2023-07-09 | Discharge: 2023-07-09 | Disposition: A | Discharge status: Home / Self Care | Attending: Family Medicine | Admitting: Family Medicine

## 2023-07-09 ENCOUNTER — Encounter: Payer: Self-pay | Admitting: Emergency Medicine

## 2023-07-09 DIAGNOSIS — S96912A Strain of unspecified muscle and tendon at ankle and foot level, left foot, initial encounter: Secondary | ICD-10-CM | POA: Diagnosis not present

## 2023-07-09 MED ORDER — PREDNISONE 20 MG PO TABS: 20.0000 mg | ORAL_TABLET | Freq: Every day | ORAL | 0 refills | Status: AC

## 2023-07-09 NOTE — ED Provider Notes (Signed)
 [facility]    CSN: [id number] Arrival date & time: 07/09/23  0841      History   Chief Complaint Chief Complaint  Patient presents with   Fall   Ankle Injury    HPI Ann Graham is a 68 y.o. female.  Patient here today for evaluation of a right ankle injury after she was walking and subsequently twisted her right ankle and falling this morning when ambulating in her home.  Patient was able to drive here to the clinic however as she has been here at the clinic she has developed worsening pain with weightbearing.  She is concerned for possible fracture.  She has not taken any medication for pain.  Note patient has severe lower extremity swelling and has a history of heart failure patient reports she has not been consistently taking Lasix  as it makes her urinate.  Did advise that blood pressure is also significantly elevated and she has not taken her medications today.  Denies any symptoms of shortness of breath, chest pain, weakness or headache. Past Medical History:  Diagnosis Date   Anxiety    Budd-Chiari syndrome (HCC)    Elevated cholesterol    Hx of cardiovascular stress test    Lexiscan  Myoview 4/14:  No ischemia, EF 75%.   Hx of echocardiogram    Echo 3/14: EF 55-60%, Gr 1 DD, mild LAE   Hypertension     Patient Active Problem List   Diagnosis Date Noted   Macular edema of left eye 05/26/2023   Age-related osteoporosis without current pathological fracture 07/31/2021   Wedge compression fracture of unspecified lumbar vertebra, initial encounter for closed fracture (HCC) 07/31/2021   Allergic rhinitis 05/10/2021   Edema 05/10/2021   Family history of malignant neoplasm of gastrointestinal tract 05/10/2021   Generalized anxiety disorder 05/10/2021   History of adenomatous polyp of colon 05/10/2021   History of diverticulitis 05/10/2021   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HCC) 05/10/2021   Urinary incontinence 05/10/2021   UTI (urinary tract  infection) 04/18/2021   OSA (obstructive sleep apnea) 04/18/2021   Chronic diastolic CHF (congestive heart failure) (HCC) 04/17/2021   Closed compression fracture of body of L1 vertebra (HCC) 04/17/2021   Compression fracture of L1 lumbar vertebra (HCC) 04/16/2021   Frequent falls 04/16/2021   Morbid obesity (HCC) 04/16/2021   Hypocalcemia 04/16/2021   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      Home Medications    Prior to Admission medications   Medication Sig Start Date End Date Taking? Authorizing Provider  alendronate (FOSAMAX) 70 MG tablet Take 70 mg by mouth. 01/16/23  Yes [provider]  atorvastatin  (LIPITOR) 20 MG tablet Take 20 mg by mouth. 06/21/20  Yes [provider]  carvedilol (COREG) 3.125 MG tablet Take 3.125 mg by mouth. 11/09/22  Yes [provider]  predniSONE  (DELTASONE ) 20 MG tablet Take 1  tablet (20 mg total) by mouth daily with breakfast for 5 days. 07/09/23 07/14/23 Yes Buena Carmine, NP  acetaminophen  (TYLENOL ) 500 MG tablet 1 tablet as needed    [provider]  acetaZOLAMIDE (DIAMOX) 250 MG tablet Take by mouth.    [provider]  albuterol  (VENTOLIN  HFA) 108 (90 Base) MCG/ACT inhaler Inhale 2 puffs into the lungs every 6 (six) hours as needed for wheezing or shortness of breath. 07/16/20   Hunsucker, Archer Kobs, MD   amoxicillin-clavulanate (AUGMENTIN) 875-125 MG tablet amoxicillin 875 mg-potassium clavulanate 125 mg tablet  TAKE 1 TABLET BY MOUTH EVERY 12 HOURS FOR 10 DAYS    [provider]  azithromycin (ZITHROMAX) 250 MG tablet azithromycin 250 mg tablet  TAKE 2 TABLETS BY MOUTH ON DAY 1, AND THEN TAKE 1 TABLET BY MOUTH ONCE A DAY ON DAY 2 THROUGH DAY 5    [provider]  azithromycin (ZITHROMAX) 250 MG tablet Take by mouth. 02/25/21   [provider]  budesonide -formoterol  (SYMBICORT ) 160-4.5 MCG/ACT inhaler Inhale 2 puffs into the lungs in the morning and at bedtime. Patient not taking: Reported on 04/16/2021 07/16/20   Hunsucker, Archer Kobs, MD  cephALEXin  (KEFLEX ) 500 MG capsule cephalexin  500 mg capsule  TAKE 1 CAPSULE BY MOUTH TWICE DAILY FOR 10 DAYS    [provider]  cetirizine  (ZYRTEC  ALLERGY) 10 MG tablet Take 1 tablet (10 mg total) by mouth daily. 06/23/20 04/16/21  Patel, Shalyn, PA-C  cetirizine  (ZYRTEC ) 10 MG tablet 1 tablet    [provider]  COVID-19 At Home Test [facility] COVID-19 AG HOME TEST VI) BinaxNOW COVID-19 Ag Self Test kit  Use as Directed on the Package    [provider]  diclofenac (VOLTAREN) 75 MG EC tablet Take 75 mg by mouth 2 (two) times daily. 03/27/21   [provider]  escitalopram (LEXAPRO) 20 MG tablet Take 20 mg by mouth.    [provider]  Famotidine (PEPCID PO) Take 20 mg by mouth as needed (heartburn).    [provider]  famotidine (PEPCID) 20 MG tablet 1 tablet at bedtime as needed    [provider]  fluconazole (DIFLUCAN) 150 MG tablet fluconazole 150 mg tablet  TAKE 1 TABLET BY MOUTH ONCE DAILY FOR 1 DAY    [provider]  fluticasone  (CUTIVATE ) 0.05 % cream fluticasone  propionate 0.05 % topical cream  APPLY TO FACE TWICE DAILY FOR IRRITATION    [provider]  furosemide  (LASIX ) 20 MG tablet Take 20 mg by mouth daily. 06/14/12   Ozell Blunt, MD   gabapentin (NEURONTIN) 300 MG capsule Take 300 mg by mouth.    [provider]  nirmatrelvir & ritonavir (PAXLOVID, 300/100,) 20 x 150 MG & 10 x 100MG  TBPK Paxlovid 300 mg (150 mg x 2)-100 mg tablets in a dose pack (EUA)  TAKE AS DIRECTED TWICE DAILY FOR 5 DAYS    [provider]  oxyCODONE -acetaminophen  (PERCOCET/ROXICET) 5-325 MG tablet Take 1 tablet by mouth every 6 (six) hours as needed for moderate pain. Patient not taking: Reported on 05/15/2021 04/18/21   Gherghe, Costin M, MD  Potassium Chloride  ER 20 MEQ TBCR Take 1 tablet by mouth daily. 03/12/21   [provider]  rosuvastatin (CRESTOR) 10 MG tablet Take 10 mg by mouth daily. 02/28/21   [provider]    Family History Family History  Problem Relation Age of Onset   Heart disease Mother    Heart disease Father  Colon cancer Father    Heart disease Brother    Breast cancer Neg Hx     Social History Social History   Tobacco Use   Smoking status: Never   Smokeless tobacco: Never  Substance Use Topics   Alcohol use: Yes    Comment: wine occasional   Drug use: No     Allergies   Hydrocodone-acetaminophen , Amoxicillin, Codeine, Hydrocodone, Lisinopril , Naproxen sodium, Other, and Naproxen   Review of Systems Review of Systems   Physical Exam Triage Vital Signs ED Triage Vitals  Encounter Vitals Group     BP 07/09/23 0916 (!) 177/79     Systolic BP Percentile --      Diastolic BP Percentile --      Pulse Rate 07/09/23 0916 (!) 57     Resp 07/09/23 0916 18     Temp 07/09/23 0916 97.9 F (36.6 C)     Temp Source 07/09/23 0916 Oral     SpO2 07/09/23 0916 98 %     Weight 07/09/23 0916 255 lb (115.7 kg)     Height --      Head Circumference --      Peak Flow --      Pain Score 07/09/23 0915 10     Pain Loc --      Pain Education --      Exclude from Growth Chart --    No data found.  Updated Vital Signs BP (!) 177/79 (BP Location: Right Arm)   Pulse (!) 57   Temp  97.9 F (36.6 C) (Oral)   Resp 18   Wt 255 lb (115.7 kg)   SpO2 98%   BMI 45.17 kg/m   Visual Acuity Right Eye Distance:   Left Eye Distance:   Bilateral Distance:    Right Eye Near:   Left Eye Near:    Bilateral Near:     Physical Exam Vitals reviewed.  Constitutional:      Appearance: Normal appearance.  HENT:     Head: Normocephalic and atraumatic.  Eyes:     Extraocular Movements: Extraocular movements intact.     Pupils: Pupils are equal, round, and reactive to light.  Cardiovascular:     Rate and Rhythm: Normal rate and regular rhythm.  Pulmonary:     Effort: Pulmonary effort is normal.     Breath sounds: Normal breath sounds.  Musculoskeletal:     Cervical back: Normal range of motion.  Skin:    General: Skin is warm and dry.     Capillary Refill: Capillary refill takes less than 2 seconds.  Neurological:     General: No focal deficit present.     Mental Status: She is alert and oriented to person, place, and time.      UC Treatments / Results  Labs (all labs ordered are listed, but only abnormal results are displayed) Labs Reviewed - No data to display  EKG   Radiology DG Ankle Complete Right Result Date: 07/09/2023 CLINICAL DATA:  Injury.  Fall EXAM: RIGHT ANKLE - COMPLETE 3+ VIEW COMPARISON:  None Available. FINDINGS: There is no evidence of fracture, dislocation, or joint effusion. There is no evidence of arthropathy or other focal bone abnormality. Soft tissues are unremarkable. Diffuse soft tissue edema IMPRESSION: Negative. Electronically Signed   By: Fredrich Jefferson M.D.   On: 07/09/2023 10:25    Procedures Procedures (including critical care time)  Medications Ordered in UC Medications - No data to display  Initial Impression / Assessment  and Plan / UC Course  I have reviewed the triage vital signs and the nursing notes.  Pertinent labs & imaging results that were available during my care of the patient were reviewed by me and considered  in my medical decision making (see chart for details).    Ankle Strain, Left, imaging negative for acute fracture.  Treatment with prednisone  20 mg once daily for 5 days..  Patient has significant lower extremity edema encourage patient to resume taking prescribed furosemide  given history of heart failure and elevated blood pressure.  Patient placed in a ankle lace up ASO to improve stability prevent reinjury of left ankle.  Patient encouraged to follow-up with sports medicine provider if symptoms worsen or do not improve with prescribed treatment.  Patient verbalized understanding and agreement with plan. Final Clinical Impressions(s) / UC Diagnoses   Final diagnoses:  Ankle strain, left, initial encounter     Discharge Instructions      You did not fracture your ankle your x-ray is negative for a break or fracture.  Suspect you likely sprained your ankle by twisting it.  Recommend wearing ankle lace up stabilizer with walking until swelling and pain subsides.  I am placing you on prednisone  20 mg once daily for 5 days to reduce the inflammation however highly suggest resuming Lasix  as the swelling in your lower extremities is likely contributing worsening the severity of the pain involving your ankle.     ED Prescriptions     Medication Sig Dispense Auth. Provider   predniSONE  (DELTASONE ) 20 MG tablet Take 1 tablet (20 mg total) by mouth daily with breakfast for 5 days. 5 tablet Buena Carmine, NP      PDMP not reviewed this encounter.   Buena Carmine, NP 07/09/23 1209

## 2023-07-09 NOTE — ED Triage Notes (Signed)
 Pt had a fall this morning and now has ankle pain on right ankle. Pt states she cannot stand on the ankle.

## 2023-07-09 NOTE — Discharge Instructions (Addendum)
 You did not fracture your ankle your x-ray is negative for a break or fracture.  Suspect you likely sprained your ankle by twisting it.  Recommend wearing ankle lace up stabilizer with walking until swelling and pain subsides.  I am placing you on prednisone  20 mg once daily for 5 days to reduce the inflammation however highly suggest resuming Lasix  as the swelling in your lower extremities is likely contributing worsening the severity of the pain involving your ankle.

## 2023-08-03 ENCOUNTER — Ambulatory Visit: Admitting: Orthopedic Surgery

## 2023-08-03 ENCOUNTER — Other Ambulatory Visit (INDEPENDENT_AMBULATORY_CARE_PROVIDER_SITE_OTHER): Payer: Self-pay

## 2023-08-03 DIAGNOSIS — M67442 Ganglion, left hand: Secondary | ICD-10-CM

## 2023-08-03 DIAGNOSIS — M79642 Pain in left hand: Secondary | ICD-10-CM | POA: Diagnosis not present

## 2023-08-03 NOTE — Progress Notes (Signed)
 Ann Graham - 68 y.o. female MRN [id number]  Date of birth: February 24, 1956  Office Visit Note: Visit Date: 08/03/2023 PCP: Alejandro Hurt, FNP Referred by: Alejandro Hurt, FNP  Subjective: No chief complaint on file.  HPI: Ann Graham is a pleasant 68 y.o. female who presents today for evaluation of the left index finger mass over the radial border of the DIP joint that has been present now for multiple months.  She states that it occasionally feels larger and smaller periodically.  She has ongoing pain and hypersensitivity in that region.  She is overall healthy and active at baseline.  Pertinent ROS were reviewed with the patient and found to be negative unless otherwise specified above in HPI.   Visit Reason: left index DIP mucoid cyst Duration of symptoms: 7 months Hand dominance: left Occupation:retired Diabetic: No Smoking: No Heart/Lung History: high blood pressure Blood Thinners:  none  Prior Testing/EMG: none Injections (Date): none Treatments: none Prior Surgery: none  Assessment & Plan: Visit Diagnoses:  1. Mucous cyst of digit of left hand   2. Left hand pain     Plan: Extensive discussion was had with the patient today regarding her mucous cyst of the left index finger.  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  We discussed the underlying etiology and pathophysiology of this condition as well as treatment modalities ranging from conservative to surgical.  From a conservative standpoint, we discussed ongoing observation, Coban wrapping, activity modification and anti-inflammatory medication both topical and oral.  From a surgical standpoint, we discussed cyst excision and DIP joint debridement.  I did mention that the underlying arthritis does persist in these cases, there is a possibility for recurrence of the cyst after excision despite DIP joint  debridement.  From a definitive standpoint, we discussed the possibility of DIP fusion in the future should the mucous cyst become recurrent.   Risks and benefits of the procedure were discussed, risks including but not limited to infection, bleeding, scarring, stiffness, nerve injury, tendon injury, vascular injury, recurrence of symptoms, need for skin advancement flap and need for subsequent operation.  We also discussed the appropriate postoperative protocol and timeframe for return to activities and function.  Patient expressed understanding.     Will move forward with surgical scheduling of left index finger DIP mucous cyst excision at the next available surgical date per patient preference.  She would like surgery done under IV sedation.  Follow-up: No follow-ups on file.   Meds & Orders: No orders of the defined types were placed in this encounter.   Orders Placed This Encounter  Procedures   XR Finger Index Left     Procedures: No procedures performed      Clinical History: No specialty comments available.  She reports that she has never smoked. She has never used smokeless tobacco. No results for input(s): "HGBA1C", "LABURIC" in the last 8760 hours.  Objective:   Vital Signs: There were no vitals taken for this visit.  Physical Exam  Gen: Well-appearing, in no acute distress; non-toxic CV: Regular Rate. Well-perfused. Warm.  Resp: Breathing unlabored on room air; no wheezing. Psych: Fluid speech in conversation; appropriate affect; normal thought process  Ortho Exam Left index finger: - 1.5 x 1.5 cm cystic structure over the radial border of the DIP joint, there is an associated underlying nail plate  deformity, eponychial fold is intact - Range of motion at the DIP 0-25 - Capillary refill is appropriate, sensation is intact distally  Imaging: XR Finger Index Left Result Date: 08/03/2023 Index finger DIP joint with notable degenerative changes, joint space narrowing  and large dorsal osteophyte formation   Past Medical/Family/Surgical/Social History: Medications & Allergies reviewed per EMR, new medications updated. Patient Active Problem List   Diagnosis Date Noted   Macular edema of left eye 05/26/2023   Age-related osteoporosis without current pathological fracture 07/31/2021   Wedge compression fracture of unspecified lumbar vertebra, initial encounter for closed fracture (HCC) 07/31/2021   Allergic rhinitis 05/10/2021   Edema 05/10/2021   Family history of malignant neoplasm of gastrointestinal tract 05/10/2021   Generalized anxiety disorder 05/10/2021   History of adenomatous polyp of colon 05/10/2021   History of diverticulitis 05/10/2021   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HCC) 05/10/2021   Urinary incontinence 05/10/2021   UTI (urinary tract infection) 04/18/2021   OSA (obstructive sleep apnea) 04/18/2021   Chronic diastolic CHF (congestive heart failure) (HCC) 04/17/2021   Closed compression fracture of body of L1 vertebra (HCC) 04/17/2021   Compression fracture of L1 lumbar vertebra (HCC) 04/16/2021   Frequent falls 04/16/2021   Morbid obesity (HCC) 04/16/2021   Hypocalcemia 04/16/2021   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   Pain in right hand 03/28/2021   Retention of urine 06/19/2020   Diastolic dysfunction 06/14/2012   Chest pain at rest 06/13/2012   Hypokalemia 06/13/2012   Cardiomegaly 06/13/2012   Idiopathic intracranial hypertension 09/03/2011   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   Empty sella syndrome (HCC) 02/20/2011   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   Hx of echocardiogram    Echo 3/14: EF 55-60%, Gr 1 DD, mild LAE    Hypertension    Family History  Problem Relation Age of Onset   Heart disease Mother    Heart disease Father    Colon cancer Father    Heart disease Brother    Breast cancer Neg Hx    Past Surgical History:  Procedure Laterality Date   DILATION AND CURETTAGE OF UTERUS     Social History   Occupational History   Occupation: Nutritional therapist: [employer]  Tobacco Use   Smoking status: Never   Smokeless tobacco: Never  Vaping Use   Vaping status: Not on file  Substance and Sexual Activity   Alcohol use: Yes    Comment: wine occasional   Drug use: No   Sexual activity: Not on file    Annalysse Shoemaker Alvia Jointer, M.D. [facility], Hand Surgery

## 2023-08-14 ENCOUNTER — Telehealth: Payer: Self-pay | Admitting: Orthopedic Surgery

## 2023-08-14 NOTE — Telephone Encounter (Signed)
 I tried getting in touch with the patient in regards to scheduling surgery for 6/5, on 08/04/23 at 3:29 pm, and 08/14/23 at 10:38 am. The aptient did not answer and does not have a voicemail set up to leave a message on.

## 2023-08-18 ENCOUNTER — Telehealth: Payer: Self-pay | Admitting: Orthopedic Surgery

## 2023-08-18 NOTE — Telephone Encounter (Signed)
 Patient called stating she had lost her phone and that's why she wasn't answering our calls. She would like to schedule surgery the last week of June. So if you could call her with a date for then.

## 2023-08-24 NOTE — Telephone Encounter (Signed)
 I spoke with the patient and scheduled her for surgery on 09/10/23.

## 2023-09-09 ENCOUNTER — Other Ambulatory Visit: Payer: Self-pay | Admitting: Orthopedic Surgery

## 2023-09-09 MED ORDER — ACETAMINOPHEN-CODEINE 300-30 MG PO TABS: 1.0000 | ORAL_TABLET | Freq: Four times a day (QID) | ORAL | 0 refills | Status: DC | PRN

## 2023-09-10 DIAGNOSIS — M67442 Ganglion, left hand: Secondary | ICD-10-CM | POA: Diagnosis not present

## 2023-09-11 ENCOUNTER — Telehealth: Payer: Self-pay | Admitting: Orthopedic Surgery

## 2023-09-11 NOTE — Telephone Encounter (Signed)
 Patient called and wants to know if she can drive or not. [phone]

## 2023-09-11 NOTE — Telephone Encounter (Signed)
 Called and advised that ok to drive as long as she isnt taking pain medication and can safely grip the wheel

## 2023-09-24 ENCOUNTER — Ambulatory Visit (INDEPENDENT_AMBULATORY_CARE_PROVIDER_SITE_OTHER): Admitting: Orthopedic Surgery

## 2023-09-24 DIAGNOSIS — M67442 Ganglion, left hand: Secondary | ICD-10-CM

## 2023-09-24 NOTE — Progress Notes (Signed)
   Ann Graham - 68 y.o. female MRN [id number]  Date of birth: 04/03/1955  Office Visit Note: Visit Date: 09/24/2023 PCP: Marvene Prentice SAUNDERS, FNP Referred by: Marvene Prentice SAUNDERS, FNP  Subjective:  HPI: Ann Graham is a 68 y.o. female who presents today for follow up 2weeks status post left index finger DIP mucus cyst excision and DIP joint debridement.  She is doing well overall.  Pain is controlled.  Pathology was reviewed which consistent with mucous cyst.  Pertinent ROS were reviewed with the patient and found to be negative unless otherwise specified above in HPI.   Assessment & Plan: Visit Diagnoses:  1. Mucous cyst of digit of left hand     Plan: She is doing very well, sutures removed today.  She can begin to advance activities with the hand as tolerated.  I did explain to her that the incisional site may remain hypersensitive in the future.  We also did review the pathology today.  I did once again explained the possibility of recurrence given the underlying arthritis.  Patient expressed full understanding.  I will have her return to me in approxi-1 month to track her progress from a functional standpoint.  Follow-up: No follow-ups on file.   Meds & Orders: No orders of the defined types were placed in this encounter.  No orders of the defined types were placed in this encounter.    Procedures: No procedures performed       Objective:   Vital Signs: There were no vitals taken for this visit.  Ortho Exam Left index finger with well-healed dorsal incision over the DIP region, no evidence of erythema or drainage - Able to form gentle range of motion of the DIP without pain or restriction  Imaging: No results found.   Marshon Bangs Afton Alderton, M.D. [facility], Hand Surgery

## 2023-10-26 ENCOUNTER — Ambulatory Visit: Admitting: Orthopedic Surgery

## 2023-10-26 DIAGNOSIS — M67442 Ganglion, left hand: Secondary | ICD-10-CM

## 2023-10-26 NOTE — Progress Notes (Signed)
   [name] - 68 y.o. female MRN [id number]  Date of birth: 05-25-1955  Office Visit Note: Visit Date: 10/26/2023 PCP: Marvene Prentice SAUNDERS, FNP Referred by: Marvene Prentice SAUNDERS, FNP  Subjective:  HPI: Laekyn Rayos is a 68 y.o. female who presents today for follow up 6 weeks status post  left index finger DIP mucus cyst excision and DIP joint debridement.  Doing well overall, no pain, mild redness at the incisional site continues to improve.  Has resumed activities as tolerated.  Pertinent ROS were reviewed with the patient and found to be negative unless otherwise specified above in HPI.   Assessment & Plan: Visit Diagnoses: No diagnosis found.  Plan: She is doing well postoperatively.  Continue with activities as tolerated.  She is welcome to follow-up in approximate 6 weeks on an as-needed basis.  Follow-up: No follow-ups on file.   Meds & Orders: No orders of the defined types were placed in this encounter.  No orders of the defined types were placed in this encounter.    Procedures: No procedures performed       Objective:   Vital Signs: There were no vitals taken for this visit.  Ortho Exam Left index finger with well-healed dorsal incision over the DIP region, no evidence of cyst recurrence or drainage - Able to perform range of motion of the DIP without pain or restriction, 0-45  Imaging: No results found.   Deshawna Mcneece Afton Alderton, M.D. [facility], Hand Surgery

## 2023-10-26 NOTE — Progress Notes (Deleted)
   Ann Graham - 68 y.o. female MRN [id number]  Date of birth: [birth year]  Office Visit Note: Visit Date: 10/26/2023 PCP: Marvene Prentice SAUNDERS, FNP Referred by: Marvene Prentice SAUNDERS, FNP  Subjective:  HPI: Ann Graham is a 68 y.o. female who presents today for follow up approximately 7 weeks status post left index finger DIP mucous cyst excision and DIP joint debridement.  Pertinent ROS were reviewed with the patient and found to be negative unless otherwise specified above in HPI.   Assessment & Plan: Visit Diagnoses: No diagnosis found.  Plan: ***  Follow-up: No follow-ups on file.   Meds & Orders: No orders of the defined types were placed in this encounter.  No orders of the defined types were placed in this encounter.    Procedures: No procedures performed       Objective:   Vital Signs: There were no vitals taken for this visit.  Ortho Exam ***  Imaging: No results found.   Anshul Afton Alderton, M.D. [facility], Hand Surgery

## 2023-11-10 ENCOUNTER — Emergency Department (HOSPITAL_COMMUNITY)

## 2023-11-10 ENCOUNTER — Inpatient Hospital Stay (HOSPITAL_COMMUNITY)
Admission: EM | Admit: 2023-11-10 | Discharge: 2023-11-14 | DRG: 193 | Disposition: A | Discharge status: Home Health | Attending: Internal Medicine | Admitting: Internal Medicine

## 2023-11-10 ENCOUNTER — Encounter (HOSPITAL_COMMUNITY): Payer: Self-pay

## 2023-11-10 ENCOUNTER — Other Ambulatory Visit: Payer: Self-pay

## 2023-11-10 DIAGNOSIS — Z1611 Resistance to penicillins: Secondary | ICD-10-CM | POA: Diagnosis present

## 2023-11-10 DIAGNOSIS — N390 Urinary tract infection, site not specified: Secondary | ICD-10-CM | POA: Diagnosis present

## 2023-11-10 DIAGNOSIS — J189 Pneumonia, unspecified organism: Secondary | ICD-10-CM | POA: Diagnosis not present

## 2023-11-10 DIAGNOSIS — Z6841 Body Mass Index (BMI) 40.0 and over, adult: Secondary | ICD-10-CM

## 2023-11-10 DIAGNOSIS — Z8249 Family history of ischemic heart disease and other diseases of the circulatory system: Secondary | ICD-10-CM

## 2023-11-10 DIAGNOSIS — I820 Budd-Chiari syndrome: Secondary | ICD-10-CM | POA: Diagnosis present

## 2023-11-10 DIAGNOSIS — R531 Weakness: Secondary | ICD-10-CM

## 2023-11-10 DIAGNOSIS — F3340 Major depressive disorder, recurrent, in remission, unspecified: Secondary | ICD-10-CM | POA: Diagnosis present

## 2023-11-10 DIAGNOSIS — R319 Hematuria, unspecified: Secondary | ICD-10-CM | POA: Diagnosis present

## 2023-11-10 DIAGNOSIS — G4733 Obstructive sleep apnea (adult) (pediatric): Secondary | ICD-10-CM | POA: Diagnosis present

## 2023-11-10 DIAGNOSIS — E7800 Pure hypercholesterolemia, unspecified: Secondary | ICD-10-CM | POA: Diagnosis present

## 2023-11-10 DIAGNOSIS — R0602 Shortness of breath: Secondary | ICD-10-CM | POA: Diagnosis present

## 2023-11-10 DIAGNOSIS — E871 Hypo-osmolality and hyponatremia: Secondary | ICD-10-CM | POA: Diagnosis present

## 2023-11-10 DIAGNOSIS — Z7951 Long term (current) use of inhaled steroids: Secondary | ICD-10-CM | POA: Diagnosis not present

## 2023-11-10 DIAGNOSIS — F411 Generalized anxiety disorder: Secondary | ICD-10-CM | POA: Diagnosis present

## 2023-11-10 DIAGNOSIS — Z800 Family history of malignant neoplasm of digestive organs: Secondary | ICD-10-CM

## 2023-11-10 DIAGNOSIS — Z885 Allergy status to narcotic agent status: Secondary | ICD-10-CM

## 2023-11-10 DIAGNOSIS — Z7983 Long term (current) use of bisphosphonates: Secondary | ICD-10-CM | POA: Diagnosis not present

## 2023-11-10 DIAGNOSIS — R0902 Hypoxemia: Secondary | ICD-10-CM | POA: Diagnosis not present

## 2023-11-10 DIAGNOSIS — Z886 Allergy status to analgesic agent status: Secondary | ICD-10-CM

## 2023-11-10 DIAGNOSIS — Z79899 Other long term (current) drug therapy: Secondary | ICD-10-CM | POA: Diagnosis not present

## 2023-11-10 DIAGNOSIS — Z1152 Encounter for screening for COVID-19: Secondary | ICD-10-CM

## 2023-11-10 DIAGNOSIS — E876 Hypokalemia: Secondary | ICD-10-CM | POA: Diagnosis present

## 2023-11-10 DIAGNOSIS — I10 Essential (primary) hypertension: Secondary | ICD-10-CM | POA: Diagnosis present

## 2023-11-10 DIAGNOSIS — J45909 Unspecified asthma, uncomplicated: Secondary | ICD-10-CM | POA: Diagnosis present

## 2023-11-10 DIAGNOSIS — R17 Unspecified jaundice: Secondary | ICD-10-CM | POA: Diagnosis present

## 2023-11-10 DIAGNOSIS — B961 Klebsiella pneumoniae [K. pneumoniae] as the cause of diseases classified elsewhere: Secondary | ICD-10-CM | POA: Diagnosis present

## 2023-11-10 DIAGNOSIS — Z888 Allergy status to other drugs, medicaments and biological substances status: Secondary | ICD-10-CM

## 2023-11-10 DIAGNOSIS — Z881 Allergy status to other antibiotic agents status: Secondary | ICD-10-CM

## 2023-11-10 LAB — I-STAT VENOUS BLOOD GAS, ED
Acid-Base Excess: 3 mmol/L — ABNORMAL HIGH (ref 0.0–2.0)
Bicarbonate: 26.6 mmol/L (ref 20.0–28.0)
Calcium, Ion: 1.05 mmol/L — ABNORMAL LOW (ref 1.15–1.40)
HCT: 37 % (ref 36.0–46.0)
Hemoglobin: 12.6 g/dL (ref 12.0–15.0)
O2 Saturation: 64 %
Potassium: 3.5 mmol/L (ref 3.5–5.1)
Sodium: 133 mmol/L — ABNORMAL LOW (ref 135–145)
TCO2: 28 mmol/L (ref 22–32)
pCO2, Ven: 37.5 mmHg — ABNORMAL LOW (ref 44–60)
pH, Ven: 7.459 — ABNORMAL HIGH (ref 7.25–7.43)
pO2, Ven: 31 mmHg — CL (ref 32–45)

## 2023-11-10 LAB — HEPATIC FUNCTION PANEL
ALT: 23 U/L (ref 0–44)
AST: 42 U/L — ABNORMAL HIGH (ref 15–41)
Albumin: 3 g/dL — ABNORMAL LOW (ref 3.5–5.0)
Alkaline Phosphatase: 84 U/L (ref 38–126)
Bilirubin, Direct: 1.3 mg/dL — ABNORMAL HIGH (ref 0.0–0.2)
Indirect Bilirubin: 2.8 mg/dL — ABNORMAL HIGH (ref 0.3–0.9)
Total Bilirubin: 4.1 mg/dL — ABNORMAL HIGH (ref 0.0–1.2)
Total Protein: 7.2 g/dL (ref 6.5–8.1)

## 2023-11-10 LAB — BRAIN NATRIURETIC PEPTIDE: B Natriuretic Peptide: 109.8 pg/mL — ABNORMAL HIGH (ref 0.0–100.0)

## 2023-11-10 LAB — BASIC METABOLIC PANEL WITH GFR
Anion gap: 13 (ref 5–15)
BUN: 26 mg/dL — ABNORMAL HIGH (ref 8–23)
CO2: 21 mmol/L — ABNORMAL LOW (ref 22–32)
Calcium: 8.2 mg/dL — ABNORMAL LOW (ref 8.9–10.3)
Chloride: 93 mmol/L — ABNORMAL LOW (ref 98–111)
Creatinine, Ser: 0.93 mg/dL (ref 0.44–1.00)
GFR, Estimated: >60 mL/min (ref >60)
Glucose, Bld: 119 mg/dL — ABNORMAL HIGH (ref 70–99)
Potassium: 3 mmol/L — ABNORMAL LOW (ref 3.5–5.1)
Sodium: 127 mmol/L — ABNORMAL LOW (ref 135–145)

## 2023-11-10 LAB — URINALYSIS, W/ REFLEX TO CULTURE (INFECTION SUSPECTED)
Bilirubin Urine: NEGATIVE
Glucose, UA: NEGATIVE mg/dL
Ketones, ur: 5 mg/dL — AB
Nitrite: NEGATIVE
Protein, ur: >=300 mg/dL — AB
Specific Gravity, Urine: 1.022 (ref 1.005–1.030)
pH: 7 (ref 5.0–8.0)

## 2023-11-10 LAB — CBC
HCT: 35.8 % — ABNORMAL LOW (ref 36.0–46.0)
Hemoglobin: 11.6 g/dL — ABNORMAL LOW (ref 12.0–15.0)
MCH: 26.6 pg (ref 26.0–34.0)
MCHC: 32.4 g/dL (ref 30.0–36.0)
MCV: 82.1 fL (ref 80.0–100.0)
Platelets: 151 K/uL (ref 150–400)
RBC: 4.36 MIL/uL (ref 3.87–5.11)
RDW: 14.7 % (ref 11.5–15.5)
WBC: 11.1 K/uL — ABNORMAL HIGH (ref 4.0–10.5)
nRBC: 0 % (ref 0.0–0.2)

## 2023-11-10 LAB — I-STAT CG4 LACTIC ACID, ED
Lactic Acid, Venous: 1.1 mmol/L (ref 0.5–1.9)
Lactic Acid, Venous: 0.8 mmol/L (ref 0.5–1.9)

## 2023-11-10 LAB — MAGNESIUM: Magnesium: 2 mg/dL (ref 1.7–2.4)

## 2023-11-10 LAB — RESP PANEL BY RT-PCR (RSV, FLU A&B, COVID)  RVPGX2
Influenza A by PCR: NEGATIVE
Influenza B by PCR: NEGATIVE
Resp Syncytial Virus by PCR: NEGATIVE
SARS Coronavirus 2 by RT PCR: NEGATIVE

## 2023-11-10 LAB — TROPONIN I (HIGH SENSITIVITY)
Troponin I (High Sensitivity): 17 ng/L (ref <18)
Troponin I (High Sensitivity): 29 ng/L — ABNORMAL HIGH (ref <18)

## 2023-11-10 MED ORDER — ACETAMINOPHEN 500 MG PO TABS
1000.0000 mg | ORAL_TABLET | Freq: Once | ORAL | Status: AC
  Administered 2023-11-10: 1000 mg via ORAL
  Filled 2023-11-10: qty 2

## 2023-11-10 MED ORDER — SODIUM CHLORIDE 0.9 % IV SOLN
500.0000 mg | Freq: Once | INTRAVENOUS | Status: AC
  Administered 2023-11-10: 500 mg via INTRAVENOUS
  Filled 2023-11-10: qty 5

## 2023-11-10 MED ORDER — FENTANYL CITRATE PF 50 MCG/ML IJ SOSY
50.0000 ug | PREFILLED_SYRINGE | Freq: Once | INTRAMUSCULAR | Status: AC
  Administered 2023-11-10: 50 ug via INTRAVENOUS
  Filled 2023-11-10: qty 1

## 2023-11-10 MED ORDER — POTASSIUM CHLORIDE CRYS ER 20 MEQ PO TBCR
40.0000 meq | EXTENDED_RELEASE_TABLET | Freq: Once | ORAL | Status: AC
  Administered 2023-11-10: 40 meq via ORAL
  Filled 2023-11-10: qty 2

## 2023-11-10 MED ORDER — IOHEXOL 350 MG/ML SOLN
100.0000 mL | Freq: Once | INTRAVENOUS | Status: AC | PRN
  Administered 2023-11-10: 100 mL via INTRAVENOUS

## 2023-11-10 MED ORDER — ALBUTEROL SULFATE (2.5 MG/3ML) 0.083% IN NEBU
2.5000 mg | INHALATION_SOLUTION | Freq: Once | RESPIRATORY_TRACT | Status: AC
  Administered 2023-11-10: 2.5 mg via RESPIRATORY_TRACT
  Filled 2023-11-10: qty 3

## 2023-11-10 MED ORDER — SODIUM CHLORIDE 0.9 % IV SOLN
1.0000 g | Freq: Once | INTRAVENOUS | Status: AC
  Administered 2023-11-10: 1 g via INTRAVENOUS
  Filled 2023-11-10: qty 10

## 2023-11-10 MED ORDER — LACTATED RINGERS IV BOLUS
2000.0000 mL | Freq: Once | INTRAVENOUS | Status: AC
  Administered 2023-11-10: 2000 mL via INTRAVENOUS

## 2023-11-10 NOTE — ED Triage Notes (Signed)
 Patient family reports shob, weakness, and generally looking unwell. Family also reports home health nurse said her HR was low this morning. Patient noted to need significant assistance to transfer to wheelchair, having difficulty speaking in complete sentences, and may be diaphoretic.

## 2023-11-10 NOTE — ED Notes (Signed)
 Vbg results to [facility] f.rn by at

## 2023-11-10 NOTE — ED Provider Notes (Signed)
 [place] EMERGENCY DEPARTMENT AT [facility] Provider Note   CSN: [id number] Arrival date & time: 11/10/23  1458     Patient presents with: Shortness of Breath and Weakness   Ann Graham is a 68 y.o. female.    Shortness of Breath Associated symptoms: vomiting   Weakness Associated symptoms: dysuria, nausea, shortness of breath and vomiting   Patient presents for multiple complaints.  Medical history includes HTN, CHF, HLD, depression, anxiety, OSA, Chiari malformation.  She has felt unwell for the past 3 days.  She has had poor appetite and decreased p.o. intake.  She has had pain in her left lower back.  She has a decreased mobility due to generalized weakness as well as the pain in her back.  This has caused her to hunch forward more than usual.  She has been prescribed Lasix  in the past but no longer takes it, due to a history of chronic bladder dysfunction.  She has had nausea and dry heaving.  She has had shortness of breath with increased work of breathing.  She has no smoking or COPD history.  She does have an inhaler at home that she uses occasionally.     Prior to Admission medications   Medication Sig Start Date End Date Taking? Authorizing Provider  acetaminophen  (TYLENOL ) 500 MG tablet 1 tablet as needed    [provider]  acetaminophen -codeine  (TYLENOL  #3) 300-30 MG tablet Take 1 tablet by mouth every 6 (six) hours as needed. 09/09/23   Agarwala, Anshul, MD  acetaZOLAMIDE (DIAMOX) 250 MG tablet Take by mouth.    [provider]  albuterol  (VENTOLIN  HFA) 108 (90 Base) MCG/ACT inhaler Inhale 2 puffs into the lungs every 6 (six) hours as needed for wheezing or shortness of breath. 07/16/20   Hunsucker, Donnice SAUNDERS, MD  alendronate (FOSAMAX) 70 MG tablet Take 70 mg by mouth. 01/16/23   [provider]  amoxicillin-clavulanate (AUGMENTIN) 875-125 MG tablet amoxicillin 875 mg-potassium clavulanate 125 mg tablet  TAKE 1 TABLET BY MOUTH EVERY  12 HOURS FOR 10 DAYS    [provider]  atorvastatin  (LIPITOR) 20 MG tablet Take 20 mg by mouth. 06/21/20   [provider]  azithromycin  (ZITHROMAX ) 250 MG tablet azithromycin  250 mg tablet  TAKE 2 TABLETS BY MOUTH ON DAY 1, AND THEN TAKE 1 TABLET BY MOUTH ONCE A DAY ON DAY 2 THROUGH DAY 5    [provider]  azithromycin  (ZITHROMAX ) 250 MG tablet Take by mouth. 02/25/21   [provider]  budesonide -formoterol  (SYMBICORT ) 160-4.5 MCG/ACT inhaler Inhale 2 puffs into the lungs in the morning and at bedtime. Patient not taking: Reported on 04/16/2021 07/16/20   Hunsucker, Donnice SAUNDERS, MD  carvedilol  (COREG ) 3.125 MG tablet Take 3.125 mg by mouth. 11/09/22   [provider]  cephALEXin  (KEFLEX ) 500 MG capsule cephalexin  500 mg capsule  TAKE 1 CAPSULE BY MOUTH TWICE DAILY FOR 10 DAYS    [provider]  cetirizine  (ZYRTEC  ALLERGY) 10 MG tablet Take 1 tablet (10 mg total) by mouth daily. 06/23/20 04/16/21  Patel, Shalyn, PA-C  cetirizine  (ZYRTEC ) 10 MG tablet 1 tablet    [provider]  COVID-19 At Home Test [facility] COVID-19 AG HOME TEST VI) BinaxNOW COVID-19 Ag Self Test kit  Use as Directed on the Package    [provider]  diclofenac (VOLTAREN) 75 MG EC tablet Take 75 mg by mouth 2 (two) times daily. 03/27/21   [provider]  escitalopram  (  LEXAPRO ) 20 MG tablet Take 20 mg by mouth.    [provider]  Famotidine (PEPCID PO) Take 20 mg by mouth as needed (heartburn).    [provider]  famotidine (PEPCID) 20 MG tablet 1 tablet at bedtime as needed    [provider]  fluconazole (DIFLUCAN) 150 MG tablet fluconazole 150 mg tablet  TAKE 1 TABLET BY MOUTH ONCE DAILY FOR 1 DAY    [provider]  fluticasone  (CUTIVATE ) 0.05 % cream fluticasone  propionate 0.05 % topical cream  APPLY TO FACE TWICE DAILY FOR IRRITATION    [provider]  furosemide  (LASIX ) 20 MG tablet Take 20 mg by  mouth daily. 06/14/12   Drusilla Sabas RAMAN, MD  gabapentin  (NEURONTIN ) 300 MG capsule Take 300 mg by mouth.    [provider]  nirmatrelvir & ritonavir (PAXLOVID, 300/100,) 20 x 150 MG & 10 x 100MG  TBPK Paxlovid 300 mg (150 mg x 2)-100 mg tablets in a dose pack (EUA)  TAKE AS DIRECTED TWICE DAILY FOR 5 DAYS    [provider]  oxyCODONE -acetaminophen  (PERCOCET/ROXICET) 5-325 MG tablet Take 1 tablet by mouth every 6 (six) hours as needed for moderate pain. Patient not taking: Reported on 05/15/2021 04/18/21   Gherghe, Costin M, MD  Potassium Chloride  ER 20 MEQ TBCR Take 1 tablet by mouth daily. 03/12/21   [provider]  rosuvastatin  (CRESTOR ) 10 MG tablet Take 10 mg by mouth daily. 02/28/21   [provider]    Allergies: Hydrocodone-acetaminophen , Amoxicillin, Codeine , Hydrocodone, Lisinopril , Naproxen sodium, Other, and Naproxen    Review of Systems  Constitutional:  Positive for activity change, appetite change and fatigue.  Respiratory:  Positive for shortness of breath.   Gastrointestinal:  Positive for nausea and vomiting.  Genitourinary:  Positive for difficulty urinating and dysuria.  Musculoskeletal:  Positive for back pain.  Neurological:  Positive for weakness (Generalized).  All other systems reviewed and are negative.   Updated Vital Signs BP (!) 97/56 (BP Location: Right Arm)   Pulse 76   Temp 98.5 F (36.9 C) (Oral)   Resp 18   Ht 5' 3 (1.6 m)   Wt 120.2 kg   SpO2 100%   BMI 46.94 kg/m   Physical Exam Vitals and nursing note reviewed.  Constitutional:      General: She is not in acute distress.    Appearance: She is well-developed. She is not ill-appearing, toxic-appearing or diaphoretic.  HENT:     Head: Normocephalic and atraumatic.  Eyes:     Conjunctiva/sclera: Conjunctivae normal.  Cardiovascular:     Rate and Rhythm: Normal rate and regular rhythm.     Heart sounds: No murmur heard. Pulmonary:     Effort: Pulmonary effort  is normal. No respiratory distress.     Breath sounds: Decreased breath sounds and rales present. No wheezing or rhonchi.  Abdominal:     Palpations: Abdomen is soft.     Tenderness: There is abdominal tenderness in the suprapubic area. There is left CVA tenderness. There is no right CVA tenderness.  Musculoskeletal:        General: No swelling.     Cervical back: Normal range of motion and neck supple.     Right lower leg: Edema present.     Left lower leg: Edema present.  Skin:    General: Skin is warm and dry.     Coloration: Skin is not cyanotic or pale.  Neurological:     General: No focal  deficit present.     Mental Status: She is alert and oriented to person, place, and time.  Psychiatric:        Mood and Affect: Mood normal.        Behavior: Behavior normal.     (all labs ordered are listed, but only abnormal results are displayed) Labs Reviewed  BASIC METABOLIC PANEL WITH GFR - Abnormal; Notable for the following components:      Result Value   Sodium 127 (*)    Potassium 3.0 (*)    Chloride 93 (*)    CO2 21 (*)    Glucose, Bld 119 (*)    BUN 26 (*)    Calcium  8.2 (*)    All other components within normal limits  CBC - Abnormal; Notable for the following components:   WBC 11.1 (*)    Hemoglobin 11.6 (*)    HCT 35.8 (*)    All other components within normal limits  BRAIN NATRIURETIC PEPTIDE - Abnormal; Notable for the following components:   B Natriuretic Peptide 109.8 (*)    All other components within normal limits  URINALYSIS, W/ REFLEX TO CULTURE (INFECTION SUSPECTED) - Abnormal; Notable for the following components:   Color, Urine AMBER (*)    APPearance CLOUDY (*)    Hgb urine dipstick SMALL (*)    Ketones, ur 5 (*)    Protein, ur >=300 (*)    Leukocytes,Ua MODERATE (*)    Bacteria, UA FEW (*)    All other components within normal limits  HEPATIC FUNCTION PANEL - Abnormal; Notable for the following components:   Albumin 3.0 (*)    AST 42 (*)     Total Bilirubin 4.1 (*)    Bilirubin, Direct 1.3 (*)    Indirect Bilirubin 2.8 (*)    All other components within normal limits  I-STAT VENOUS BLOOD GAS, ED - Abnormal; Notable for the following components:   pH, Ven 7.459 (*)    pCO2, Ven 37.5 (*)    pO2, Ven 31 (*)    Acid-Base Excess 3.0 (*)    Sodium 133 (*)    Calcium , Ion 1.05 (*)    All other components within normal limits  TROPONIN I (HIGH SENSITIVITY) - Abnormal; Notable for the following components:   Troponin I (High Sensitivity) 29 (*)    All other components within normal limits  RESP PANEL BY RT-PCR (RSV, FLU A&B, COVID)  RVPGX2  CULTURE, BLOOD (ROUTINE X 2)  CULTURE, BLOOD (ROUTINE X 2)  URINE CULTURE  MAGNESIUM  LEGIONELLA PNEUMOPHILA SEROGP 1 UR AG  I-STAT CG4 LACTIC ACID, ED  I-STAT CG4 LACTIC ACID, ED  TROPONIN I (HIGH SENSITIVITY)    EKG: EKG Interpretation Date/Time:  Tuesday November 10 2023 18:18:17 EDT Ventricular Rate:  83 PR Interval:  157 QRS Duration:  113 QT Interval:  424 QTC Calculation: 499 R Axis:   48  Text Interpretation: Sinus rhythm Atrial premature complexes Sinus pause Low voltage, precordial leads Abnormal inferior Q waves Consider anterior infarct Minimal ST depression, lateral leads Confirmed by Melvenia Motto [phone]) on 11/10/2023 6:36:34 PM  Radiology: CT Angio Chest PE W and/or Wo Contrast Result Date: 11/10/2023 CLINICAL DATA:  Sepsis.  Shortness of breath and weakness. EXAM: CT ANGIOGRAPHY CHEST CT ABDOMEN AND PELVIS WITH CONTRAST TECHNIQUE: Multidetector CT imaging of the chest was performed using the standard protocol during bolus administration of intravenous contrast. Multiplanar CT image reconstructions and MIPs were obtained to evaluate the vascular anatomy. Multidetector CT imaging  of the abdomen and pelvis was performed using the standard protocol during bolus administration of intravenous contrast. RADIATION DOSE REDUCTION: This exam was performed according to the departmental  dose-optimization program which includes automated exposure control, adjustment of the mA and/or kV according to patient size and/or use of iterative reconstruction technique. CONTRAST:  OMNIPAQUE  IOHEXOL  350 MG/ML SOLN COMPARISON:  CT abdomen 04/16/2021 and chest radiograph 11/10/2023 FINDINGS: CTA CHEST FINDINGS Cardiovascular: No filling defect is identified in the pulmonary arterial tree to suggest pulmonary embolus. Atheromatous vascular calcification of the aortic arch and left anterior descending coronary artery. Moderate cardiomegaly. Mediastinum/Nodes: 0.9 cm right infrahilar lymph node. Additional scattered likely reactive lymph nodes in the right hilum right subcarinal region. Lungs/Pleura: Consolidation in much of the right lower lobe laterally suspicious for bacterial pneumonia process. No current cavitation. Bandlike density favoring atelectasis anteriorly in the left lower lobe. Trace right pleural effusion. Musculoskeletal: No significant findings Review of the MIP images confirms the above findings. CT ABDOMEN and PELVIS FINDINGS Hepatobiliary: Unremarkable Pancreas: Unremarkable Spleen: Unremarkable Adrenals/Urinary Tract: No significant findings Stomach/Bowel: Unremarkable.  Normal appendix. Vascular/Lymphatic: Atherosclerosis is present, including aortoiliac atherosclerotic disease. Upper normal size left external iliac nodes but similar to the 04/16/2021 exam. Reproductive: Unremarkable Other: No supplemental non-categorized findings. Musculoskeletal: Progressive collapse of the L1 vertebral body now with complete collapse centrally along with some middle column involvement and up to 5 mm of chronic posterior retropulsion. The L1 vertebra demonstrated about 45% compression on the prior CT scan from 04/16/2021. 3 mm grade 1 degenerative anterolisthesis at L4-5. Loss of intervertebral disc height and disc desiccation at L5-S1. Review of the MIP images confirms the above findings. IMPRESSION: 1.  No filling defect is identified in the pulmonary arterial tree to suggest pulmonary embolus. 2. Consolidation in much of the right lower lobe laterally suspicious for bacterial pneumonia process. No current cavitation. Reactive lymph nodes along the right hilar/infrahilar region. 3. Trace right pleural effusion. 4. Moderate cardiomegaly. 5. Progressive collapse of the L1 vertebral body now with complete collapse centrally along with some middle column involvement and up to 5 mm of chronic posterior retropulsion. The L1 vertebra demonstrated about 45% compression on the prior CT scan from 04/16/2021. 6.  Aortic Atherosclerosis (ICD10-I70.0). Electronically Signed   By: Gwendalynn Eckstrom Salvage M.D.   On: 11/10/2023 21:26   CT ABDOMEN PELVIS W CONTRAST Result Date: 11/10/2023 CLINICAL DATA:  Sepsis.  Shortness of breath and weakness. EXAM: CT ANGIOGRAPHY CHEST CT ABDOMEN AND PELVIS WITH CONTRAST TECHNIQUE: Multidetector CT imaging of the chest was performed using the standard protocol during bolus administration of intravenous contrast. Multiplanar CT image reconstructions and MIPs were obtained to evaluate the vascular anatomy. Multidetector CT imaging of the abdomen and pelvis was performed using the standard protocol during bolus administration of intravenous contrast. RADIATION DOSE REDUCTION: This exam was performed according to the departmental dose-optimization program which includes automated exposure control, adjustment of the mA and/or kV according to patient size and/or use of iterative reconstruction technique. CONTRAST:  OMNIPAQUE  IOHEXOL  350 MG/ML SOLN COMPARISON:  CT abdomen 04/16/2021 and chest radiograph 11/10/2023 FINDINGS: CTA CHEST FINDINGS Cardiovascular: No filling defect is identified in the pulmonary arterial tree to suggest pulmonary embolus. Atheromatous vascular calcification of the aortic arch and left anterior descending coronary artery. Moderate cardiomegaly. Mediastinum/Nodes: 0.9 cm  right infrahilar lymph node. Additional scattered likely reactive lymph nodes in the right hilum right subcarinal region. Lungs/Pleura: Consolidation in much of the right lower lobe laterally suspicious for bacterial pneumonia  process. No current cavitation. Bandlike density favoring atelectasis anteriorly in the left lower lobe. Trace right pleural effusion. Musculoskeletal: No significant findings Review of the MIP images confirms the above findings. CT ABDOMEN and PELVIS FINDINGS Hepatobiliary: Unremarkable Pancreas: Unremarkable Spleen: Unremarkable Adrenals/Urinary Tract: No significant findings Stomach/Bowel: Unremarkable.  Normal appendix. Vascular/Lymphatic: Atherosclerosis is present, including aortoiliac atherosclerotic disease. Upper normal size left external iliac nodes but similar to the 04/16/2021 exam. Reproductive: Unremarkable Other: No supplemental non-categorized findings. Musculoskeletal: Progressive collapse of the L1 vertebral body now with complete collapse centrally along with some middle column involvement and up to 5 mm of chronic posterior retropulsion. The L1 vertebra demonstrated about 45% compression on the prior CT scan from 04/16/2021. 3 mm grade 1 degenerative anterolisthesis at L4-5. Loss of intervertebral disc height and disc desiccation at L5-S1. Review of the MIP images confirms the above findings. IMPRESSION: 1. No filling defect is identified in the pulmonary arterial tree to suggest pulmonary embolus. 2. Consolidation in much of the right lower lobe laterally suspicious for bacterial pneumonia process. No current cavitation. Reactive lymph nodes along the right hilar/infrahilar region. 3. Trace right pleural effusion. 4. Moderate cardiomegaly. 5. Progressive collapse of the L1 vertebral body now with complete collapse centrally along with some middle column involvement and up to 5 mm of chronic posterior retropulsion. The L1 vertebra demonstrated about 45% compression on the  prior CT scan from 04/16/2021. 6.  Aortic Atherosclerosis (ICD10-I70.0). Electronically Signed   By: Zori Benbrook Salvage M.D.   On: 11/10/2023 21:26   DG Chest 2 View Result Date: 11/10/2023 CLINICAL DATA:  sob EXAM: CHEST - 2 VIEW COMPARISON:  Chest x-ray 06/23/2020 FINDINGS: Limited evaluation due to overlapping osseous structures and overlying soft tissues. The heart and mediastinal contours are within normal limits. Bibasilar atelectasis. No focal consolidation. No pulmonary edema. Query bilateral pleural effusion. No pneumothorax. No acute osseous abnormality. IMPRESSION: Query bilateral pleural effusions. Limited evaluation due to overlapping osseous structures and overlying soft tissues. Electronically Signed   By: Morgane  Naveau M.D.   On: 11/10/2023 17:39     Procedures   Medications Ordered in the ED  potassium chloride  SA (KLOR-CON  M) CR tablet 40 mEq (has no administration in time range)  lactated ringers  bolus 2,000 mL (has no administration in time range)  cefTRIAXone  (ROCEPHIN ) 1 g in sodium chloride  0.9 % 100 mL IVPB (0 g Intravenous Stopped 11/10/23 1855)  azithromycin  (ZITHROMAX ) 500 mg in sodium chloride  0.9 % 250 mL IVPB (0 mg Intravenous Stopped 11/10/23 2004)  albuterol  (PROVENTIL ) (2.5 MG/3ML) 0.083% nebulizer solution 2.5 mg (2.5 mg Nebulization Given 11/10/23 1800)  fentaNYL  (SUBLIMAZE ) injection 50 mcg (50 mcg Intravenous Given 11/10/23 1804)  acetaminophen  (TYLENOL ) tablet 1,000 mg (1,000 mg Oral Given 11/10/23 1802)  iohexol  (OMNIPAQUE ) 350 MG/ML injection 100 mL (100 mLs Intravenous Contrast Given 11/10/23 2047)                                    Medical Decision Making Amount and/or Complexity of Data Reviewed Labs: ordered. Radiology: ordered.  Risk OTC drugs. Prescription drug management.   This patient presents to the ED for concern of generalized weakness, shortness of breath, this involves an extensive number of treatment options, and is a complaint that  carries with it a high risk of complications and morbidity.  The differential diagnosis includes infection, metabolic derangements, deconditioning, polypharmacy, anemia   Co morbidities / Chronic conditions that  complicate the patient evaluation  HTN, CHF, HLD, depression, anxiety, OSA, Chiari malformation   Additional history obtained:  Additional history obtained from EMR External records from outside source obtained and reviewed including patient's daughter   Lab Tests:  I Ordered, and personally interpreted labs.  The pertinent results include: Normal kidney function, hypokalemia, hyponatremia, and hypochloremia are present.  Bilirubin is elevated.  Patient also has a mild leukocytosis.  Lactate is normal.  Urinalysis is consistent with UTI.   Imaging Studies ordered:  I ordered imaging studies including chest x-ray, CTA chest, CT of abdomen and pelvis I independently visualized and interpreted imaging which showed right lower lobe pneumonia, trace right pleural effusion, L1 vertebral body collapse, increased from imaging from 2 years ago. I agree with the radiologist interpretation   Cardiac Monitoring: / EKG:  The patient was maintained on a cardiac monitor.  I personally viewed and interpreted the cardiac monitored which showed an underlying rhythm of: Sinus rhythm   Problem List / ED Course / Critical interventions / Medication management  Patient presenting after feeling unwell for the past several days.  Recent symptoms have included poor appetite, nausea, dry heaving, generalized weakness, fatigue, shortness of breath.  On arrival, she is found to have a low-grade fever.  On exam, she is overall well-appearing.  She does have labored breathing and is not able to fully complete sentences.  On lung auscultation, she has diminished breath sounds.  She has bilateral lower extremity edema.  She has been prescribed Lasix  in the past but no longer takes it due to chronic bladder  issues.  I suspect some element of volume overload.  Patient's abdomen is soft.  She does have suprapubic tenderness as well as left CVA tenderness.  This raises suspicion for possible urinary source of infection.  Patient was given Tylenol  for antipyresis, Percocet for analgesia.  She is not currently nauseous at this time.  Patient was treated empirically for UTI and pneumonia with ceftriaxone  and azithromycin .  Workup was initiated.  Initially, fluids were held due to possible undiagnosed CHF causing her shortness of breath.  Her lab work is notable for hypokalemia and mild leukocytosis.  Replacement potassium was ordered.  Urinalysis does show evidence of infection.  She also has an elevation in bilirubin.  Patient underwent imaging studies.  CT of chest did not show evidence of PE but does show evidence of right lower lobe pneumonia.  CT of abdomen and pelvis did not show any evidence of biliary obstruction.  BNP is only mildly elevated.  IV fluids were ordered.  Patient was admitted for further management. I ordered medication including IV fluids for hydration, antibiotics for UTI and pneumonia, Tylenol  for antipyresis, fentanyl  for analgesia, potassium chloride  for hypokalemia, albuterol  for shortness of breath Reevaluation of the patient after these medicines showed that the patient improved I have reviewed the patients home medicines and have made adjustments as needed  Social Determinants of Health:  Lives at home with family     Final diagnoses:  Pneumonia of right lower lobe due to infectious organism  Urinary tract infection with hematuria, site unspecified  Generalized weakness  Hypokalemia  Hyponatremia    ED Discharge Orders     None          Melvenia Motto, MD 11/10/23 2311

## 2023-11-10 NOTE — ED Notes (Signed)
 In and out cath completed, urine collected and sent to lab

## 2023-11-11 ENCOUNTER — Encounter (HOSPITAL_COMMUNITY): Payer: Self-pay | Admitting: Family Medicine

## 2023-11-11 ENCOUNTER — Inpatient Hospital Stay (HOSPITAL_COMMUNITY)

## 2023-11-11 DIAGNOSIS — J45909 Unspecified asthma, uncomplicated: Secondary | ICD-10-CM | POA: Diagnosis present

## 2023-11-11 DIAGNOSIS — E876 Hypokalemia: Secondary | ICD-10-CM | POA: Diagnosis not present

## 2023-11-11 DIAGNOSIS — R17 Unspecified jaundice: Secondary | ICD-10-CM | POA: Diagnosis present

## 2023-11-11 DIAGNOSIS — I10 Essential (primary) hypertension: Secondary | ICD-10-CM | POA: Diagnosis not present

## 2023-11-11 DIAGNOSIS — J189 Pneumonia, unspecified organism: Secondary | ICD-10-CM | POA: Diagnosis not present

## 2023-11-11 DIAGNOSIS — F411 Generalized anxiety disorder: Secondary | ICD-10-CM | POA: Diagnosis not present

## 2023-11-11 LAB — CBC
HCT: 36.5 % (ref 36.0–46.0)
Hemoglobin: 11.9 g/dL — ABNORMAL LOW (ref 12.0–15.0)
MCH: 26.8 pg (ref 26.0–34.0)
MCHC: 32.6 g/dL (ref 30.0–36.0)
MCV: 82.2 fL (ref 80.0–100.0)
Platelets: 144 K/uL — ABNORMAL LOW (ref 150–400)
RBC: 4.44 MIL/uL (ref 3.87–5.11)
RDW: 15.1 % (ref 11.5–15.5)
WBC: 8.2 K/uL (ref 4.0–10.5)
nRBC: 0 % (ref 0.0–0.2)

## 2023-11-11 LAB — COMPREHENSIVE METABOLIC PANEL WITH GFR
ALT: 20 U/L (ref 0–44)
AST: 37 U/L (ref 15–41)
Albumin: 2.5 g/dL — ABNORMAL LOW (ref 3.5–5.0)
Alkaline Phosphatase: 74 U/L (ref 38–126)
Anion gap: 10 (ref 5–15)
BUN: 25 mg/dL — ABNORMAL HIGH (ref 8–23)
CO2: 24 mmol/L (ref 22–32)
Calcium: 8 mg/dL — ABNORMAL LOW (ref 8.9–10.3)
Chloride: 100 mmol/L (ref 98–111)
Creatinine, Ser: 0.97 mg/dL (ref 0.44–1.00)
GFR, Estimated: >60 mL/min (ref >60)
Glucose, Bld: 90 mg/dL (ref 70–99)
Potassium: 3.4 mmol/L — ABNORMAL LOW (ref 3.5–5.1)
Sodium: 134 mmol/L — ABNORMAL LOW (ref 135–145)
Total Bilirubin: 2.8 mg/dL — ABNORMAL HIGH (ref 0.0–1.2)
Total Protein: 6.3 g/dL — ABNORMAL LOW (ref 6.5–8.1)

## 2023-11-11 LAB — TROPONIN I (HIGH SENSITIVITY): Troponin I (High Sensitivity): 23 ng/L — ABNORMAL HIGH (ref <18)

## 2023-11-11 LAB — LIPASE, BLOOD: Lipase: 63 U/L — ABNORMAL HIGH (ref 11–51)

## 2023-11-11 LAB — LACTATE DEHYDROGENASE: LDH: 167 U/L (ref 98–192)

## 2023-11-11 LAB — PROTIME-INR
INR: 1.3 — ABNORMAL HIGH (ref 0.8–1.2)
Prothrombin Time: 16.9 s — ABNORMAL HIGH (ref 11.4–15.2)

## 2023-11-11 MED ORDER — LOSARTAN POTASSIUM 50 MG PO TABS
100.0000 mg | ORAL_TABLET | Freq: Every day | ORAL | Status: DC
  Administered 2023-11-11 – 2023-11-14 (×4): 100 mg via ORAL
  Filled 2023-11-11 (×4): qty 2

## 2023-11-11 MED ORDER — PROCHLORPERAZINE EDISYLATE 10 MG/2ML IJ SOLN: 5.0000 mg | Freq: Four times a day (QID) | INTRAMUSCULAR | Status: DC | PRN

## 2023-11-11 MED ORDER — GABAPENTIN 300 MG PO CAPS
300.0000 mg | ORAL_CAPSULE | Freq: Every day | ORAL | Status: DC
  Administered 2023-11-11 – 2023-11-13 (×3): 300 mg via ORAL
  Filled 2023-11-11 (×3): qty 1

## 2023-11-11 MED ORDER — PANTOPRAZOLE SODIUM 40 MG PO TBEC
40.0000 mg | DELAYED_RELEASE_TABLET | Freq: Every morning | ORAL | Status: DC
  Administered 2023-11-11 – 2023-11-14 (×4): 40 mg via ORAL
  Filled 2023-11-11 (×4): qty 1

## 2023-11-11 MED ORDER — SODIUM CHLORIDE 0.9 % IV SOLN
100.0000 mg | Freq: Two times a day (BID) | INTRAVENOUS | Status: DC
  Administered 2023-11-11 – 2023-11-13 (×5): 100 mg via INTRAVENOUS
  Filled 2023-11-11 (×5): qty 100

## 2023-11-11 MED ORDER — CARVEDILOL 3.125 MG PO TABS: 3.1250 mg | ORAL_TABLET | Freq: Two times a day (BID) | ORAL | Status: DC

## 2023-11-11 MED ORDER — ROSUVASTATIN CALCIUM 5 MG PO TABS
10.0000 mg | ORAL_TABLET | Freq: Every day | ORAL | Status: DC
  Administered 2023-11-11 – 2023-11-14 (×4): 10 mg via ORAL
  Filled 2023-11-11 (×4): qty 2

## 2023-11-11 MED ORDER — ACETAMINOPHEN 650 MG RE SUPP: 650.0000 mg | Freq: Four times a day (QID) | RECTAL | Status: DC | PRN

## 2023-11-11 MED ORDER — OXYCODONE HCL 5 MG PO TABS
5.0000 mg | ORAL_TABLET | ORAL | Status: DC | PRN
  Administered 2023-11-11 – 2023-11-13 (×2): 5 mg via ORAL
  Filled 2023-11-11 (×2): qty 1

## 2023-11-11 MED ORDER — GATIFLOXACIN 0.5 % OP SOLN
1.0000 [drp] | Freq: Four times a day (QID) | OPHTHALMIC | Status: DC
  Administered 2023-11-11 – 2023-11-14 (×13): 1 [drp] via OPHTHALMIC
  Filled 2023-11-11 (×3): qty 2.5

## 2023-11-11 MED ORDER — SODIUM CHLORIDE 0.9 % IV SOLN
2.0000 g | INTRAVENOUS | Status: DC
  Administered 2023-11-11 – 2023-11-12 (×2): 2 g via INTRAVENOUS
  Filled 2023-11-11 (×2): qty 20

## 2023-11-11 MED ORDER — POTASSIUM CHLORIDE CRYS ER 20 MEQ PO TBCR
40.0000 meq | EXTENDED_RELEASE_TABLET | Freq: Every day | ORAL | Status: DC
  Administered 2023-11-11: 40 meq via ORAL
  Filled 2023-11-11 (×2): qty 2

## 2023-11-11 MED ORDER — ALBUTEROL SULFATE (2.5 MG/3ML) 0.083% IN NEBU
2.5000 mg | INHALATION_SOLUTION | RESPIRATORY_TRACT | Status: DC | PRN
  Administered 2023-11-11 – 2023-11-14 (×3): 2.5 mg via RESPIRATORY_TRACT
  Filled 2023-11-11 (×3): qty 3

## 2023-11-11 MED ORDER — POLYETHYLENE GLYCOL 3350 17 G PO PACK: 17.0000 g | PACK | Freq: Every day | ORAL | Status: DC | PRN

## 2023-11-11 MED ORDER — ORAL CARE MOUTH RINSE: 15.0000 mL | OROMUCOSAL | Status: DC | PRN

## 2023-11-11 MED ORDER — ENOXAPARIN SODIUM 60 MG/0.6ML IJ SOSY
60.0000 mg | PREFILLED_SYRINGE | INTRAMUSCULAR | Status: DC
  Administered 2023-11-11 – 2023-11-13 (×3): 60 mg via SUBCUTANEOUS
  Filled 2023-11-11 (×3): qty 0.6

## 2023-11-11 MED ORDER — FENTANYL CITRATE PF 50 MCG/ML IJ SOSY: 12.5000 ug | PREFILLED_SYRINGE | INTRAMUSCULAR | Status: DC | PRN

## 2023-11-11 MED ORDER — ACETAMINOPHEN 325 MG PO TABS
650.0000 mg | ORAL_TABLET | Freq: Four times a day (QID) | ORAL | Status: DC | PRN
  Administered 2023-11-11: 650 mg via ORAL
  Filled 2023-11-11: qty 2

## 2023-11-11 MED ORDER — SODIUM CHLORIDE 0.9% FLUSH
3.0000 mL | Freq: Two times a day (BID) | INTRAVENOUS | Status: DC
  Administered 2023-11-11 – 2023-11-13 (×5): 3 mL via INTRAVENOUS

## 2023-11-11 NOTE — ED Notes (Signed)
 IP unit notified of impending patient movement.

## 2023-11-11 NOTE — TOC Initial Note (Signed)
 Transition of Care [facility]) - Initial/Assessment Note    Patient Details  Name: Ann Graham MRN: [id number] Date of Birth: 1955/05/30  Transition of Care [facility]) CM/SW Contact:    Waddell Barnie Rama, RN Phone Number: 11/11/2023, 4:40 PM  Clinical Narrative:                 From home with daughter, has PCP and insurance on file, states has no HH services in place at this time or, has cane/walker at home.  States family member (daughter)  will transport them home at [employer] and family is support system, states gets medications from [facility] in [place].  Pta self ambulatory.   There are no ICM needs identified  at this time.  Please place consult for ICM needs.    Expected Discharge Plan: Home w Home Health Services Barriers to Discharge: Continued Medical Work up   Patient Goals and CMS Choice Patient states their goals for this hospitalization and ongoing recovery are:: lives with daughter , SIL and grand son   Choice offered to / list presented to : NA      Expected Discharge Plan and Services In-house Referral: NA Discharge Planning Services: CM Consult Post Acute Care Choice: NA Living arrangements for the past 2 months: Single Family Home                 DME Arranged: N/A DME Agency: NA       HH Arranged: NA HH Agency: NA        Prior Living Arrangements/Services Living arrangements for the past 2 months: Single Family Home Lives with:: Adult Children Patient language and need for interpreter reviewed:: Yes Do you feel safe going back to the place where you live?: Yes      Need for Family Participation in Patient Care: Yes (Comment) Care giver support system in place?: Yes (comment) Current home services: DME (cane, walker)    Activities of Daily Living      Permission Sought/Granted Permission sought to share information with : Case Manager Permission granted to share information with : Yes, Verbal Permission Granted              Emotional  Assessment   Attitude/Demeanor/Rapport: Engaged Affect (typically observed): Appropriate Orientation: : Oriented to Self, Oriented to Place, Oriented to  Time, Oriented to Situation Alcohol / Substance Use: Not Applicable Psych Involvement: No (comment)  Admission diagnosis:  Hypokalemia [E87.6] Hyponatremia [E87.1] Right lower lobe pneumonia [J18.9] Generalized weakness [R53.1] Urinary tract infection with hematuria, site unspecified [N39.0, R31.9] Pneumonia of right lower lobe due to infectious organism [J18.9] Patient Active Problem List   Diagnosis Date Noted   Jaundice 11/11/2023   Asthma, chronic 11/11/2023   Right lower lobe pneumonia 11/10/2023   Macular edema of left eye 05/26/2023   Age-related osteoporosis without current pathological fracture 07/31/2021   Wedge compression fracture of unspecified lumbar vertebra, initial encounter for closed fracture (HCC) 07/31/2021   Allergic rhinitis 05/10/2021   Edema 05/10/2021   Family history of malignant neoplasm of gastrointestinal tract 05/10/2021   Generalized anxiety disorder 05/10/2021   History of adenomatous polyp of colon 05/10/2021   History of diverticulitis 05/10/2021   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HCC) 05/10/2021   Urinary incontinence 05/10/2021   UTI (urinary tract infection) 04/18/2021   OSA (obstructive sleep apnea) 04/18/2021   Chronic diastolic CHF (congestive heart failure) (HCC) 04/17/2021   Closed compression fracture of body of L1 vertebra (HCC) 04/17/2021   Compression fracture  of L1 lumbar vertebra (HCC) 04/16/2021   Frequent falls 04/16/2021   Morbid obesity (HCC) 04/16/2021   Hypocalcemia 04/16/2021   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   Pain in right hand 03/28/2021   Retention of urine 06/19/2020   Diastolic dysfunction 06/14/2012   Chest pain at rest 06/13/2012   Hypokalemia 06/13/2012   Cardiomegaly 06/13/2012   Idiopathic intracranial hypertension 09/03/2011   Chiari  malformation 02/20/2011   Empty sella syndrome (HCC) 02/20/2011   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   Papilledema 12/20/2010   Hypercholesterolemia 08/12/2008   OBESITY 08/12/2008   HYPERTENSION 08/12/2008   PCP:  Marvene Prentice SAUNDERS, FNP Pharmacy:   [facility] [address] [place] - [address] [facility] [place] [address] Phone: [phone] Fax: [phone]     Social Drivers of Health (SDOH) Social History: SDOH Screenings   Food Insecurity: Patient Declined (04/02/2023)   Received from [facility]  Housing: Unknown (04/02/2023)   Received from [facility]  Transportation Needs: No Transportation Needs (04/02/2023)   Received from [facility]  Utilities: Not At Risk (04/02/2023)   Received from [facility]  Depression (PHQ2-9): Low Risk  (07/31/2021)  Financial Resource Strain: Low Risk  (04/02/2023)   Received from [facility]  Social Connections: Socially Integrated (04/02/2023)   Received from [facility]  Stress: No Stress Concern Present (04/02/2023)   Received from [facility]  Tobacco Use: Low Risk  (11/11/2023)   SDOH Interventions:     Readmission Risk Interventions     No data to display

## 2023-11-11 NOTE — Progress Notes (Addendum)
   MEWS Progress Note  Patient Details Name: Ann Graham MRN: [id number] DOB: 11/22/1955 Today's Date: 11/11/2023   MEWS Flowsheet Documentation:  Assess: MEWS Score Temp: (!) 102.8 F (39.3 C) BP: (!) 150/69 MAP (mmHg): 90 Pulse Rate: 72 ECG Heart Rate: 89 Resp: 20 Level of Consciousness: Alert SpO2: 99 % O2 Device: Room Air O2 Flow Rate (L/min): 2 L/min FiO2 (%): 28 % Assess: MEWS Score MEWS Temp: 2 MEWS Systolic: 0 MEWS Pulse: 0 MEWS RR: 0 MEWS LOC: 0 MEWS Score: 2 MEWS Score Color: Yellow Assess: SIRS CRITERIA SIRS Temperature : 1 SIRS Respirations : 0 SIRS Pulse: 0 SIRS WBC: 0 SIRS Score Sum : 1 SIRS Temperature : 1 SIRS Pulse: 0 SIRS Respirations : 0 SIRS WBC: 0 SIRS Score Sum : 1 Assess: if the MEWS score is Yellow or Red Were vital signs accurate and taken at a resting state?: Yes Does the patient meet 2 or more of the SIRS criteria?: Yes Does the patient have a confirmed or suspected source of infection?: Yes MEWS guidelines implemented : Yes, yellow Treat MEWS Interventions: Considered administering scheduled or prn medications/treatments as ordered Take Vital Signs Increase Vital Sign Frequency : Yellow: Q2hr x1, continue Q4hrs until patient remains green for 12hrs Escalate MEWS: Escalate: Yellow: Discuss with charge nurse and consider notifying provider and/or RRT Notify: Charge Nurse/RN Name of Charge Nurse/RN Notified: Garrel Provider Notification Provider Name/Title: Darci Date Provider Notified: 11/11/23 Time Provider Notified: [id number] Method of Notification: Page Notification Reason: Other (Comment) (MEWS) Provider response: No new orders  Pt arrived to 3[address] rm 30 with daughter, patient was covered in stool from bottom to her feet, Vitals are stable, BP elevated, patients work of breathing is labored and she is wheezing, temp was 102.8, MD notified, MEWS started. PRN meds given    Yogi Arther C Warden 11/11/2023, 3:37 PM

## 2023-11-11 NOTE — ED Notes (Signed)
 Patient had very large running stool. Brief changed, tolerated well.

## 2023-11-11 NOTE — Progress Notes (Signed)
 Progress Note   Patient: Ann Graham FMW:[id number] DOB: 10-25-1955 DOA: 11/10/2023     1 DOS: the patient was seen and examined on 11/11/2023   Brief hospital course: Itzell Bendavid is a 68 y.o. female with medical history significant for hypertension, hyperlipidemia, depression, anxiety, asthma, and BMI 47 who presents with 3 days of progressive shortness of breath, fatigue, loss of appetite, and jaundice. She is admitted to hospital for right sided pneumonia, jaundice, debility.  Assessment and Plan: Right sided pneumonia Hypoxia- Continue Ceftriaxone , doxycycline . Supplemental oxygen to maintain saturation above 92%. Follow blood cultures, urine cultures. Encourage incentive spirometry, out of bed.  L1 vertebral compression- Discussed with Dr. Lindalee over phone, this is chronic issue, no intervention need. Continue pain control. PT/ OT.  Hyperbilirubinemia- unknown etiology Improving. RUQ sono unremarkable. LFT normalised.  Hypokalemia- Replete as needed.  Hypertension- Coreg , Losartan  resumed.  Hyperlipidemia- Started crestor  therapy.  Morbid Obesity BMI 46.94 Hypoxia possibly due to undiagnosed OSA. Advised to follow pulmonary for sleep study. Diet, exercise and weight reduction advised.     Out of bed to chair. Incentive spirometry. Nursing supportive care. Fall, aspiration precautions. Diet:  Diet Orders (From admission, onward)     Start     Ordered   11/11/23 0409  Diet regular Room service appropriate? Yes; Fluid consistency: Thin  Diet effective now       Question Answer Comment  Room service appropriate? Yes   Fluid consistency: Thin      11/11/23 0413           DVT prophylaxis:   Level of care: Progressive   Code Status: Full Code  Subjective: Patient is seen and examined today morning. She is lying in bed, ate better per daughter. She is on 2L supplemental o2. Hypoxia noted more with sleep.  Physical Exam: Vitals:   11/11/23 0600  11/11/23 0900 11/11/23 1200 11/11/23 1212  BP: (!) 116/97 (!) 118/49 135/66   Pulse: 89 85 84   Resp: 19 (!) 25 (!) 24   Temp: 98.5 F (36.9 C)   98.1 F (36.7 C)  TempSrc: Oral   Oral  SpO2: 97% 100% 100%   Weight:      Height:        General - Elderly morbidly obese Caucasian female, mild respiratory distress HEENT - PERRLA, EOMI, atraumatic head, non tender sinuses. Lung - Clear, basal rales, rhonchi, no wheezes. Heart - S1, S2 heard, no murmurs, rubs, trace pedal edema. Abdomen - Soft, non tender, obese, bowel sounds good Neuro - Alert, awake and oriented, non focal exam. Skin - Warm and dry.  Data Reviewed:      Latest Ref Rng & Units 11/11/2023    6:13 AM 11/10/2023    6:03 PM 11/10/2023    4:27 PM  CBC  WBC 4.0 - 10.5 K/uL 8.2   11.1   Hemoglobin 12.0 - 15.0 g/dL 88.0  87.3  88.3   Hematocrit 36.0 - 46.0 % 36.5  37.0  35.8   Platelets 150 - 400 K/uL 144   151       Latest Ref Rng & Units 11/11/2023    6:13 AM 11/10/2023    6:03 PM 11/10/2023    4:27 PM  BMP  Glucose 70 - 99 mg/dL 90   880   BUN 8 - 23 mg/dL 25   26   Creatinine 9.55 - 1.00 mg/dL 9.02   9.06   Sodium 864 - 145 mmol/L 134  133  127   Potassium 3.5 - 5.1 mmol/L 3.4  3.5  3.0   Chloride 98 - 111 mmol/L 100   93   CO2 22 - 32 mmol/L 24   21   Calcium  8.9 - 10.3 mg/dL 8.0   8.2    US  Abdomen Limited RUQ (LIVER/GB) Result Date: 11/11/2023 CLINICAL DATA:  Jaundice EXAM: ULTRASOUND ABDOMEN LIMITED RIGHT UPPER QUADRANT COMPARISON:  CT abdomen pelvis 11/10/2023 FINDINGS: Gallbladder: Gallstones: None Sludge: None Gallbladder Wall: Within normal limits Pericholecystic fluid: None Sonographic Murphy's Sign: Negative per technologist Common bile duct: Diameter: 4 mm Liver: Parenchymal echogenicity: Normal Contours: Normal Lesions: None Portal vein: Patent.  Hepatopetal flow Other: None. IMPRESSION: No significant sonographic abnormality of the liver or gallbladder. Electronically Signed   By: Aliene Lloyd M.D.    On: 11/11/2023 07:48   CT Angio Chest PE W and/or Wo Contrast Result Date: 11/10/2023 CLINICAL DATA:  Sepsis.  Shortness of breath and weakness. EXAM: CT ANGIOGRAPHY CHEST CT ABDOMEN AND PELVIS WITH CONTRAST TECHNIQUE: Multidetector CT imaging of the chest was performed using the standard protocol during bolus administration of intravenous contrast. Multiplanar CT image reconstructions and MIPs were obtained to evaluate the vascular anatomy. Multidetector CT imaging of the abdomen and pelvis was performed using the standard protocol during bolus administration of intravenous contrast. RADIATION DOSE REDUCTION: This exam was performed according to the departmental dose-optimization program which includes automated exposure control, adjustment of the mA and/or kV according to patient size and/or use of iterative reconstruction technique. CONTRAST:  OMNIPAQUE  IOHEXOL  350 MG/ML SOLN COMPARISON:  CT abdomen 04/16/2021 and chest radiograph 11/10/2023 FINDINGS: CTA CHEST FINDINGS Cardiovascular: No filling defect is identified in the pulmonary arterial tree to suggest pulmonary embolus. Atheromatous vascular calcification of the aortic arch and left anterior descending coronary artery. Moderate cardiomegaly. Mediastinum/Nodes: 0.9 cm right infrahilar lymph node. Additional scattered likely reactive lymph nodes in the right hilum right subcarinal region. Lungs/Pleura: Consolidation in much of the right lower lobe laterally suspicious for bacterial pneumonia process. No current cavitation. Bandlike density favoring atelectasis anteriorly in the left lower lobe. Trace right pleural effusion. Musculoskeletal: No significant findings Review of the MIP images confirms the above findings. CT ABDOMEN and PELVIS FINDINGS Hepatobiliary: Unremarkable Pancreas: Unremarkable Spleen: Unremarkable Adrenals/Urinary Tract: No significant findings Stomach/Bowel: Unremarkable.  Normal appendix. Vascular/Lymphatic: Atherosclerosis is  present, including aortoiliac atherosclerotic disease. Upper normal size left external iliac nodes but similar to the 04/16/2021 exam. Reproductive: Unremarkable Other: No supplemental non-categorized findings. Musculoskeletal: Progressive collapse of the L1 vertebral body now with complete collapse centrally along with some middle column involvement and up to 5 mm of chronic posterior retropulsion. The L1 vertebra demonstrated about 45% compression on the prior CT scan from 04/16/2021. 3 mm grade 1 degenerative anterolisthesis at L4-5. Loss of intervertebral disc height and disc desiccation at L5-S1. Review of the MIP images confirms the above findings. IMPRESSION: 1. No filling defect is identified in the pulmonary arterial tree to suggest pulmonary embolus. 2. Consolidation in much of the right lower lobe laterally suspicious for bacterial pneumonia process. No current cavitation. Reactive lymph nodes along the right hilar/infrahilar region. 3. Trace right pleural effusion. 4. Moderate cardiomegaly. 5. Progressive collapse of the L1 vertebral body now with complete collapse centrally along with some middle column involvement and up to 5 mm of chronic posterior retropulsion. The L1 vertebra demonstrated about 45% compression on the prior CT scan from 04/16/2021. 6.  Aortic Atherosclerosis (ICD10-I70.0). Electronically Signed   By: Ryan  Ramond M.D.   On: 11/10/2023 21:26   CT ABDOMEN PELVIS W CONTRAST Result Date: 11/10/2023 CLINICAL DATA:  Sepsis.  Shortness of breath and weakness. EXAM: CT ANGIOGRAPHY CHEST CT ABDOMEN AND PELVIS WITH CONTRAST TECHNIQUE: Multidetector CT imaging of the chest was performed using the standard protocol during bolus administration of intravenous contrast. Multiplanar CT image reconstructions and MIPs were obtained to evaluate the vascular anatomy. Multidetector CT imaging of the abdomen and pelvis was performed using the standard protocol during bolus administration of  intravenous contrast. RADIATION DOSE REDUCTION: This exam was performed according to the departmental dose-optimization program which includes automated exposure control, adjustment of the mA and/or kV according to patient size and/or use of iterative reconstruction technique. CONTRAST:  OMNIPAQUE  IOHEXOL  350 MG/ML SOLN COMPARISON:  CT abdomen 04/16/2021 and chest radiograph 11/10/2023 FINDINGS: CTA CHEST FINDINGS Cardiovascular: No filling defect is identified in the pulmonary arterial tree to suggest pulmonary embolus. Atheromatous vascular calcification of the aortic arch and left anterior descending coronary artery. Moderate cardiomegaly. Mediastinum/Nodes: 0.9 cm right infrahilar lymph node. Additional scattered likely reactive lymph nodes in the right hilum right subcarinal region. Lungs/Pleura: Consolidation in much of the right lower lobe laterally suspicious for bacterial pneumonia process. No current cavitation. Bandlike density favoring atelectasis anteriorly in the left lower lobe. Trace right pleural effusion. Musculoskeletal: No significant findings Review of the MIP images confirms the above findings. CT ABDOMEN and PELVIS FINDINGS Hepatobiliary: Unremarkable Pancreas: Unremarkable Spleen: Unremarkable Adrenals/Urinary Tract: No significant findings Stomach/Bowel: Unremarkable.  Normal appendix. Vascular/Lymphatic: Atherosclerosis is present, including aortoiliac atherosclerotic disease. Upper normal size left external iliac nodes but similar to the 04/16/2021 exam. Reproductive: Unremarkable Other: No supplemental non-categorized findings. Musculoskeletal: Progressive collapse of the L1 vertebral body now with complete collapse centrally along with some middle column involvement and up to 5 mm of chronic posterior retropulsion. The L1 vertebra demonstrated about 45% compression on the prior CT scan from 04/16/2021. 3 mm grade 1 degenerative anterolisthesis at L4-5. Loss of intervertebral disc  height and disc desiccation at L5-S1. Review of the MIP images confirms the above findings. IMPRESSION: 1. No filling defect is identified in the pulmonary arterial tree to suggest pulmonary embolus. 2. Consolidation in much of the right lower lobe laterally suspicious for bacterial pneumonia process. No current cavitation. Reactive lymph nodes along the right hilar/infrahilar region. 3. Trace right pleural effusion. 4. Moderate cardiomegaly. 5. Progressive collapse of the L1 vertebral body now with complete collapse centrally along with some middle column involvement and up to 5 mm of chronic posterior retropulsion. The L1 vertebra demonstrated about 45% compression on the prior CT scan from 04/16/2021. 6.  Aortic Atherosclerosis (ICD10-I70.0). Electronically Signed   By: Ryan Ramond M.D.   On: 11/10/2023 21:26   DG Chest 2 View Result Date: 11/10/2023 CLINICAL DATA:  sob EXAM: CHEST - 2 VIEW COMPARISON:  Chest x-ray 06/23/2020 FINDINGS: Limited evaluation due to overlapping osseous structures and overlying soft tissues. The heart and mediastinal contours are within normal limits. Bibasilar atelectasis. No focal consolidation. No pulmonary edema. Query bilateral pleural effusion. No pneumothorax. No acute osseous abnormality. IMPRESSION: Query bilateral pleural effusions. Limited evaluation due to overlapping osseous structures and overlying soft tissues. Electronically Signed   By: Morgane  Naveau M.D.   On: 11/10/2023 17:39    Family Communication: Discussed with patient, daughter at bedside. They understand and agree. All questions answered.  Disposition: Status is: Inpatient Remains inpatient appropriate because: IV antibiotics, PT/OT  Planned Discharge Destination: Home with Home Health  Time spent: 46 minutes  Author: Concepcion Riser, MD 11/11/2023 12:35 PM Secure chat 7am to 7pm For on call review [email or web address].

## 2023-11-11 NOTE — Plan of Care (Signed)

## 2023-11-11 NOTE — H&P (Signed)
 History and Physical    Ann Graham FMW:[id number] DOB: Nov 14, 1955 DOA: 11/10/2023  PCP: Marvene Prentice SAUNDERS, FNP   Patient coming from: Home   Chief Complaint: SOB, fatigue, malaise, jaundice  HPI: Ann Graham is a 68 y.o. female with medical history significant for hypertension, hyperlipidemia, depression, anxiety, asthma, and BMI 47 who presents with 3 days of progressive shortness of breath, fatigue, loss of appetite, and jaundice.   Patient is complaining of fatigue, general malaise, loss of appetite, and progressive shortness of breath over the past 3 days.  Her daughter became concerned today when she noted the patient to be jaundiced.  Patient denies any abdominal pain, nausea, vomiting, diarrhea, or dysuria associated with this.  ED Course: Upon arrival to the ED, patient is found to be febrile to 38.2 C and saturating low 90s on room air with mild tachypnea, normal HR, and SBP in the 90s.  Labs are most notable for sodium 127, potassium 3.0, normal creatinine, total bilirubin 4.1, WBC 11,100, normal lactate, and BNP 110.  CTA chest is negative for PE but concerning for right lower lobe pneumonia.  CT of the abdomen pelvis demonstrates progressive L1 vertebral body collapse.  Blood and urine cultures were collected in the ED and the patient was treated with 2 L LR, acetaminophen , fentanyl , oral potassium, albuterol , Rocephin , and azithromycin .  Review of Systems:  All other systems reviewed and apart from HPI, are negative.  Past Medical History:  Diagnosis Date   Anxiety    Asthma, chronic 11/11/2023   Budd-Chiari syndrome (HCC)    Elevated cholesterol    Hx of cardiovascular stress test    Lexiscan  Myoview 4/14:  No ischemia, EF 75%.   Hx of echocardiogram    Echo 3/14: EF 55-60%, Gr 1 DD, mild LAE   Hypertension     Past Surgical History:  Procedure Laterality Date   DILATION AND CURETTAGE OF UTERUS      Social History:   reports that she has never smoked. She  has never used smokeless tobacco. She reports current alcohol use. She reports that she does not use drugs.  Allergies  Allergen Reactions   Hydrocodone-Acetaminophen  Rash   Naproxen Sodium Shortness Of Breath and Swelling   Amoxicillin Hives and Rash    yeast    Codeine  Hives   Hydrocodone Hives   Lisinopril  Swelling and Cough    Congestion, fluid build up   Other Hives    Powder coating **patient doesn't remember this**   Alendronate Sodium Diarrhea   Furosemide      Kidney pain   Naproxen Swelling   Hydrochlorothiazide Rash    Family History  Problem Relation Age of Onset   Heart disease Mother    Heart disease Father    Colon cancer Father    Heart disease Brother    Breast cancer Neg Hx      Prior to Admission medications   Medication Sig Start Date End Date Taking? Authorizing Provider  albuterol  (VENTOLIN  HFA) 108 (90 Base) MCG/ACT inhaler Inhale 2 puffs into the lungs every 6 (six) hours as needed for wheezing or shortness of breath. 07/16/20  Yes Hunsucker, Donnice SAUNDERS, MD  BESIVANCE 0.6 % SUSP Place 1 drop into the left eye 4 (four) times daily. 10/19/23  Yes [provider]  carvedilol  (COREG ) 3.125 MG tablet Take 3.125 mg by mouth 2 (two) times daily with a meal. 11/09/22  Yes [provider]  famotidine (PEPCID) 20 MG tablet Take  20 mg by mouth daily.   Yes [provider]  gabapentin  (NEURONTIN ) 300 MG capsule Take 300 mg by mouth at bedtime.   Yes [provider]  hydrALAZINE (APRESOLINE) 10 MG tablet 1 tablet as needed for SBP > 160 Orally every 8 hours As needed   Yes [provider]  losartan  (COZAAR ) 100 MG tablet Take 100 mg by mouth daily. 06/15/23  Yes [provider]  pantoprazole  (PROTONIX ) 40 MG tablet Take 40 mg by mouth every morning.   Yes [provider]  rosuvastatin  (CRESTOR ) 10 MG tablet Take 10 mg by mouth daily. 02/28/21  Yes [provider]  alendronate (FOSAMAX) 70 MG tablet  Take 70 mg by mouth. 01/16/23   [provider]  Bromfenac Sodium 0.07 % SOLN Apply 1 drop to eye 2 (two) times daily. Patient not taking: Reported on 11/11/2023 11/09/23   [provider]  Difluprednate 0.05 % EMUL Apply 1 drop to eye 4 (four) times daily. Patient not taking: Reported on 11/11/2023 11/09/23   [provider]  escitalopram  (LEXAPRO ) 20 MG tablet Take 20 mg by mouth. Patient not taking: Reported on 11/11/2023    [provider]  furosemide  (LASIX ) 20 MG tablet Take 20 mg by mouth daily. Patient not taking: Reported on 11/11/2023 06/14/12   Drusilla Sabas RAMAN, MD  gatifloxacin  (ZYMAXID ) 0.5 % SOLN Place 1 drop into the left eye 4 (four) times daily. Patient not taking: Reported on 11/11/2023 11/09/23   [provider]    Physical Exam: Vitals:   11/11/23 0200 11/11/23 0224 11/11/23 0300 11/11/23 0400  BP: (!) 141/66  (!) 148/89 (!) 129/59  Pulse: 83  85 83  Resp: 17  (!) 24 (!) 22  Temp:  98.1 F (36.7 C)    TempSrc:  Oral    SpO2: 100%  100% 100%  Weight:      Height:        Constitutional: NAD, no pallor or diaphoresis   Eyes: PERTLA, lids and conjunctivae normal ENMT: Mucous membranes are moist. Posterior pharynx clear of any exudate or lesions.   Neck: supple, no masses  Respiratory: Coarse rales on right. No wheezing.  Cardiovascular: S1 & S2 heard, regular rate and rhythm. Mild lower extremity edema.  Abdomen: No tenderness, soft. Bowel sounds active.  Musculoskeletal: no clubbing / cyanosis. No joint deformity upper and lower extremities.   Skin: Lower leg hyperpigmentation in gaiter distribution bilaterally. Warm, dry, well-perfused. Neurologic: CN 2-12 grossly intact. Moving all extremities. Alert and oriented.  Psychiatric: Calm. Cooperative.    Labs and Imaging on Admission: I have personally reviewed following labs and imaging studies  CBC: Recent Labs  Lab 11/10/23 1627 11/10/23 1803  WBC 11.1*  --   HGB 11.6*  12.6  HCT 35.8* 37.0  MCV 82.1  --   PLT 151  --    Basic Metabolic Panel: Recent Labs  Lab 11/10/23 1627 11/10/23 1752 11/10/23 1803  NA 127*  --  133*  K 3.0*  --  3.5  CL 93*  --   --   CO2 21*  --   --   GLUCOSE 119*  --   --   BUN 26*  --   --   CREATININE 0.93  --   --   CALCIUM  8.2*  --   --   MG  --  2.0  --    GFR: Estimated Creatinine Clearance: 70.6 mL/min (by C-G formula based on SCr of 0.93  mg/dL). Liver Function Tests: Recent Labs  Lab 11/10/23 1752  AST 42*  ALT 23  ALKPHOS 84  BILITOT 4.1*  PROT 7.2  ALBUMIN 3.0*   No results for input(s): LIPASE, AMYLASE in the last 168 hours. No results for input(s): AMMONIA in the last 168 hours. Coagulation Profile: No results for input(s): INR, PROTIME in the last 168 hours. Cardiac Enzymes: No results for input(s): CKTOTAL, CKMB, CKMBINDEX, TROPONINI in the last 168 hours. BNP (last 3 results) No results for input(s): PROBNP in the last 8760 hours. HbA1C: No results for input(s): HGBA1C in the last 72 hours. CBG: No results for input(s): GLUCAP in the last 168 hours. Lipid Profile: No results for input(s): CHOL, HDL, LDLCALC, TRIG, CHOLHDL, LDLDIRECT in the last 72 hours. Thyroid  Function Tests: No results for input(s): TSH, T4TOTAL, FREET4, T3FREE, THYROIDAB in the last 72 hours. Anemia Panel: No results for input(s): VITAMINB12, FOLATE, FERRITIN, TIBC, IRON, RETICCTPCT in the last 72 hours. Urine analysis:    Component Value Date/Time   COLORURINE AMBER (A) 11/10/2023 2122   APPEARANCEUR CLOUDY (A) 11/10/2023 2122   LABSPEC 1.022 11/10/2023 2122   PHURINE 7.0 11/10/2023 2122   GLUCOSEU NEGATIVE 11/10/2023 2122   HGBUR SMALL (A) 11/10/2023 2122   BILIRUBINUR NEGATIVE 11/10/2023 2122   KETONESUR 5 (A) 11/10/2023 2122   PROTEINUR >=300 (A) 11/10/2023 2122   NITRITE NEGATIVE 11/10/2023 2122   LEUKOCYTESUR MODERATE (A) 11/10/2023 2122    Sepsis Labs: @LABRCNTIP (procalcitonin:4,lacticidven:4) ) Recent Results (from the past 240 hours)  Resp panel by RT-PCR (RSV, Flu A&B, Covid) Anterior Nasal Swab     Status: None   Collection Time: 11/10/23  5:52 PM   Specimen: Anterior Nasal Swab  Result Value Ref Range Status   SARS Coronavirus 2 by RT PCR NEGATIVE NEGATIVE Final   Influenza A by PCR NEGATIVE NEGATIVE Final   Influenza B by PCR NEGATIVE NEGATIVE Final    Comment: (NOTE) The Xpert Xpress SARS-CoV-2/FLU/RSV plus assay is intended as an aid in the diagnosis of influenza from Nasopharyngeal swab specimens and should not be used as a sole basis for treatment. Nasal washings and aspirates are unacceptable for Xpert Xpress SARS-CoV-2/FLU/RSV testing.  Fact Sheet for Patients: BloggerCourse.com  Fact Sheet for Healthcare Providers: SeriousBroker.it  This test is not yet approved or cleared by the [place]  FDA and has been authorized for detection and/or diagnosis of SARS-CoV-2 by FDA under an Emergency Use Authorization (EUA). This EUA will remain in effect (meaning this test can be used) for the duration of the COVID-19 declaration under Section 564(b)(1) of the Act, 21 U.S.C. section 360bbb-3(b)(1), unless the authorization is terminated or revoked.     Resp Syncytial Virus by PCR NEGATIVE NEGATIVE Final    Comment: (NOTE) Fact Sheet for Patients: BloggerCourse.com  Fact Sheet for Healthcare Providers: SeriousBroker.it  This test is not yet approved or cleared by the [place]  FDA and has been authorized for detection and/or diagnosis of SARS-CoV-2 by FDA under an Emergency Use Authorization (EUA). This EUA will remain in effect (meaning this test can be used) for the duration of the COVID-19 declaration under Section 564(b)(1) of the Act, 21 U.S.C. section 360bbb-3(b)(1), unless the authorization  is terminated or revoked.  Performed at [facility], [address], [place], [place] [address]      Radiological Exams on Admission: CT Angio Chest PE W and/or Wo Contrast Result Date: 11/10/2023 CLINICAL DATA:  Sepsis.  Shortness of breath and weakness. EXAM: CT ANGIOGRAPHY  CHEST CT ABDOMEN AND PELVIS WITH CONTRAST TECHNIQUE: Multidetector CT imaging of the chest was performed using the standard protocol during bolus administration of intravenous contrast. Multiplanar CT image reconstructions and MIPs were obtained to evaluate the vascular anatomy. Multidetector CT imaging of the abdomen and pelvis was performed using the standard protocol during bolus administration of intravenous contrast. RADIATION DOSE REDUCTION: This exam was performed according to the departmental dose-optimization program which includes automated exposure control, adjustment of the mA and/or kV according to patient size and/or use of iterative reconstruction technique. CONTRAST:  OMNIPAQUE  IOHEXOL  350 MG/ML SOLN COMPARISON:  CT abdomen 04/16/2021 and chest radiograph 11/10/2023 FINDINGS: CTA CHEST FINDINGS Cardiovascular: No filling defect is identified in the pulmonary arterial tree to suggest pulmonary embolus. Atheromatous vascular calcification of the aortic arch and left anterior descending coronary artery. Moderate cardiomegaly. Mediastinum/Nodes: 0.9 cm right infrahilar lymph node. Additional scattered likely reactive lymph nodes in the right hilum right subcarinal region. Lungs/Pleura: Consolidation in much of the right lower lobe laterally suspicious for bacterial pneumonia process. No current cavitation. Bandlike density favoring atelectasis anteriorly in the left lower lobe. Trace right pleural effusion. Musculoskeletal: No significant findings Review of the MIP images confirms the above findings. CT ABDOMEN and PELVIS FINDINGS Hepatobiliary: Unremarkable Pancreas: Unremarkable Spleen: Unremarkable  Adrenals/Urinary Tract: No significant findings Stomach/Bowel: Unremarkable.  Normal appendix. Vascular/Lymphatic: Atherosclerosis is present, including aortoiliac atherosclerotic disease. Upper normal size left external iliac nodes but similar to the 04/16/2021 exam. Reproductive: Unremarkable Other: No supplemental non-categorized findings. Musculoskeletal: Progressive collapse of the L1 vertebral body now with complete collapse centrally along with some middle column involvement and up to 5 mm of chronic posterior retropulsion. The L1 vertebra demonstrated about 45% compression on the prior CT scan from 04/16/2021. 3 mm grade 1 degenerative anterolisthesis at L4-5. Loss of intervertebral disc height and disc desiccation at L5-S1. Review of the MIP images confirms the above findings. IMPRESSION: 1. No filling defect is identified in the pulmonary arterial tree to suggest pulmonary embolus. 2. Consolidation in much of the right lower lobe laterally suspicious for bacterial pneumonia process. No current cavitation. Reactive lymph nodes along the right hilar/infrahilar region. 3. Trace right pleural effusion. 4. Moderate cardiomegaly. 5. Progressive collapse of the L1 vertebral body now with complete collapse centrally along with some middle column involvement and up to 5 mm of chronic posterior retropulsion. The L1 vertebra demonstrated about 45% compression on the prior CT scan from 04/16/2021. 6.  Aortic Atherosclerosis (ICD10-I70.0). Electronically Signed   By: Ryan Salvage M.D.   On: 11/10/2023 21:26   CT ABDOMEN PELVIS W CONTRAST Result Date: 11/10/2023 CLINICAL DATA:  Sepsis.  Shortness of breath and weakness. EXAM: CT ANGIOGRAPHY CHEST CT ABDOMEN AND PELVIS WITH CONTRAST TECHNIQUE: Multidetector CT imaging of the chest was performed using the standard protocol during bolus administration of intravenous contrast. Multiplanar CT image reconstructions and MIPs were obtained to evaluate the vascular  anatomy. Multidetector CT imaging of the abdomen and pelvis was performed using the standard protocol during bolus administration of intravenous contrast. RADIATION DOSE REDUCTION: This exam was performed according to the departmental dose-optimization program which includes automated exposure control, adjustment of the mA and/or kV according to patient size and/or use of iterative reconstruction technique. CONTRAST:  OMNIPAQUE  IOHEXOL  350 MG/ML SOLN COMPARISON:  CT abdomen 04/16/2021 and chest radiograph 11/10/2023 FINDINGS: CTA CHEST FINDINGS Cardiovascular: No filling defect is identified in the pulmonary arterial tree to suggest pulmonary embolus. Atheromatous vascular calcification of the aortic arch and  left anterior descending coronary artery. Moderate cardiomegaly. Mediastinum/Nodes: 0.9 cm right infrahilar lymph node. Additional scattered likely reactive lymph nodes in the right hilum right subcarinal region. Lungs/Pleura: Consolidation in much of the right lower lobe laterally suspicious for bacterial pneumonia process. No current cavitation. Bandlike density favoring atelectasis anteriorly in the left lower lobe. Trace right pleural effusion. Musculoskeletal: No significant findings Review of the MIP images confirms the above findings. CT ABDOMEN and PELVIS FINDINGS Hepatobiliary: Unremarkable Pancreas: Unremarkable Spleen: Unremarkable Adrenals/Urinary Tract: No significant findings Stomach/Bowel: Unremarkable.  Normal appendix. Vascular/Lymphatic: Atherosclerosis is present, including aortoiliac atherosclerotic disease. Upper normal size left external iliac nodes but similar to the 04/16/2021 exam. Reproductive: Unremarkable Other: No supplemental non-categorized findings. Musculoskeletal: Progressive collapse of the L1 vertebral body now with complete collapse centrally along with some middle column involvement and up to 5 mm of chronic posterior retropulsion. The L1 vertebra demonstrated about 45%  compression on the prior CT scan from 04/16/2021. 3 mm grade 1 degenerative anterolisthesis at L4-5. Loss of intervertebral disc height and disc desiccation at L5-S1. Review of the MIP images confirms the above findings. IMPRESSION: 1. No filling defect is identified in the pulmonary arterial tree to suggest pulmonary embolus. 2. Consolidation in much of the right lower lobe laterally suspicious for bacterial pneumonia process. No current cavitation. Reactive lymph nodes along the right hilar/infrahilar region. 3. Trace right pleural effusion. 4. Moderate cardiomegaly. 5. Progressive collapse of the L1 vertebral body now with complete collapse centrally along with some middle column involvement and up to 5 mm of chronic posterior retropulsion. The L1 vertebra demonstrated about 45% compression on the prior CT scan from 04/16/2021. 6.  Aortic Atherosclerosis (ICD10-I70.0). Electronically Signed   By: Ryan Salvage M.D.   On: 11/10/2023 21:26   DG Chest 2 View Result Date: 11/10/2023 CLINICAL DATA:  sob EXAM: CHEST - 2 VIEW COMPARISON:  Chest x-ray 06/23/2020 FINDINGS: Limited evaluation due to overlapping osseous structures and overlying soft tissues. The heart and mediastinal contours are within normal limits. Bibasilar atelectasis. No focal consolidation. No pulmonary edema. Query bilateral pleural effusion. No pneumothorax. No acute osseous abnormality. IMPRESSION: Query bilateral pleural effusions. Limited evaluation due to overlapping osseous structures and overlying soft tissues. Electronically Signed   By: Morgane  Naveau M.D.   On: 11/10/2023 17:39    EKG: Independently reviewed. Sinus rhythm, PACs.   Assessment/Plan   1. Pneumonia  - Continue antibiotics and supportive care, follow cultures and clinical course    2. Jaundice  - Total bilirubin is 4.1 with indirect bili 2.8, normal alk phos, AST 42, ALT 23, and no acute hepatobiliary findings on CT  - Check LDH, haptoglobin, INR, and RUQ US ,  repeat LFTs in am   3. Hypokalemia  - Replacing    4. Asthma; OSA  - Not in exacerbation  - Continue as-needed albuterol , use CPAP while sleeping   5. Hypertension  - SBP is in the 90s in the ED and antihypertensives will be held for now    DVT prophylaxis: Lovenox   Code Status: Full  Level of Care: Level of care: Progressive Family Communication: Daughter at bedside  Disposition Plan:  Patient is from: Home  Anticipated d/c is to: TBD Anticipated d/c date is: 11/14/23  Patient currently: Pending improved respiratory status, investigation of hyperbilirubinemia  Consults called: None  Admission status: Inpatient     Evalene GORMAN Sprinkles, MD [facility]  11/11/2023, 4:13 AM

## 2023-11-12 DIAGNOSIS — J189 Pneumonia, unspecified organism: Secondary | ICD-10-CM | POA: Diagnosis not present

## 2023-11-12 LAB — CBC
HCT: 31.1 % — ABNORMAL LOW (ref 36.0–46.0)
Hemoglobin: 10 g/dL — ABNORMAL LOW (ref 12.0–15.0)
MCH: 26.7 pg (ref 26.0–34.0)
MCHC: 32.2 g/dL (ref 30.0–36.0)
MCV: 82.9 fL (ref 80.0–100.0)
Platelets: 158 K/uL (ref 150–400)
RBC: 3.75 MIL/uL — ABNORMAL LOW (ref 3.87–5.11)
RDW: 15.4 % (ref 11.5–15.5)
WBC: 7 K/uL (ref 4.0–10.5)
nRBC: 0 % (ref 0.0–0.2)

## 2023-11-12 LAB — COMPREHENSIVE METABOLIC PANEL WITH GFR
ALT: 22 U/L (ref 0–44)
AST: 39 U/L (ref 15–41)
Albumin: 2.2 g/dL — ABNORMAL LOW (ref 3.5–5.0)
Alkaline Phosphatase: 73 U/L (ref 38–126)
Anion gap: 10 (ref 5–15)
BUN: 22 mg/dL (ref 8–23)
CO2: 23 mmol/L (ref 22–32)
Calcium: 7.9 mg/dL — ABNORMAL LOW (ref 8.9–10.3)
Chloride: 101 mmol/L (ref 98–111)
Creatinine, Ser: 0.9 mg/dL (ref 0.44–1.00)
GFR, Estimated: >60 mL/min (ref >60)
Glucose, Bld: 92 mg/dL (ref 70–99)
Potassium: 3.3 mmol/L — ABNORMAL LOW (ref 3.5–5.1)
Sodium: 134 mmol/L — ABNORMAL LOW (ref 135–145)
Total Bilirubin: 1.4 mg/dL — ABNORMAL HIGH (ref 0.0–1.2)
Total Protein: 6.1 g/dL — ABNORMAL LOW (ref 6.5–8.1)

## 2023-11-12 LAB — URINE CULTURE: Culture: >=100000 — AB

## 2023-11-12 LAB — HIV ANTIBODY (ROUTINE TESTING W REFLEX): HIV Screen 4th Generation wRfx: NONREACTIVE

## 2023-11-12 LAB — HAPTOGLOBIN: Haptoglobin: 399 mg/dL — ABNORMAL HIGH (ref 37–355)

## 2023-11-12 MED ORDER — ESCITALOPRAM OXALATE 10 MG PO TABS
20.0000 mg | ORAL_TABLET | Freq: Every day | ORAL | Status: DC
  Administered 2023-11-12 – 2023-11-14 (×3): 20 mg via ORAL
  Filled 2023-11-12 (×3): qty 2

## 2023-11-12 MED ORDER — BUDESONIDE 0.5 MG/2ML IN SUSP
0.5000 mg | Freq: Two times a day (BID) | RESPIRATORY_TRACT | Status: DC
  Administered 2023-11-12 – 2023-11-13 (×3): 0.5 mg via RESPIRATORY_TRACT
  Filled 2023-11-12 (×4): qty 2

## 2023-11-12 MED ORDER — IPRATROPIUM-ALBUTEROL 0.5-2.5 (3) MG/3ML IN SOLN
3.0000 mL | Freq: Four times a day (QID) | RESPIRATORY_TRACT | Status: AC
  Administered 2023-11-12 – 2023-11-13 (×2): 3 mL via RESPIRATORY_TRACT
  Filled 2023-11-12 (×3): qty 3

## 2023-11-12 MED ORDER — MELATONIN 5 MG PO TABS
5.0000 mg | ORAL_TABLET | Freq: Every evening | ORAL | Status: DC | PRN
  Administered 2023-11-12 – 2023-11-13 (×2): 5 mg via ORAL
  Filled 2023-11-12 (×2): qty 1

## 2023-11-12 MED ORDER — POTASSIUM CHLORIDE CRYS ER 20 MEQ PO TBCR
40.0000 meq | EXTENDED_RELEASE_TABLET | Freq: Two times a day (BID) | ORAL | Status: AC
Start: 2023-11-12 — End: 2023-11-12
  Administered 2023-11-12 (×2): 40 meq via ORAL
  Filled 2023-11-12: qty 2

## 2023-11-12 NOTE — Evaluation (Signed)
 Occupational Therapy Evaluation Patient Details Name: Ann Graham MRN: [id number] DOB: [birth year] Today's Date: 11/12/2023   History of Present Illness   Pt is a 68 y.o. female admitted 8/26 for SOB and jaundice. Admitted for R sided pneumonia. Chronic L1 vertebral compression.  PMH: HTN, HLD, morbid obesity     Clinical Impressions Pt admitted based on above, and was seen based on problem list below. Pt reporting  independence with ADLs and IADLs prior to admission, questionable historian d/t cog deficits. Today pt is requiring set up  to total assist for ADLs. Bed mobility and functional transfers are  mod +2 with RW. Pt requiring +2 to come to stand, but once in standing min assist to manage the RW with gait. Pt with decreased STM, problem solving, and sequencing creating safety risk.  Pt can d/c home with HHOT and 24/7 supervision from family d/t cog deficits. OT will continue to follow acutely to maximize functional independence.      If plan is discharge home, recommend the following:   A lot of help with walking and/or transfers;A lot of help with bathing/dressing/bathroom     Functional Status Assessment   Patient has had a recent decline in their functional status and demonstrates the ability to make significant improvements in function in a reasonable and predictable amount of time.     Equipment Recommendations   BSC/3in1      Precautions/Restrictions   Precautions Precautions: Fall Recall of Precautions/Restrictions: Impaired Restrictions Weight Bearing Restrictions Per Provider Order: No     Mobility Bed Mobility Overal bed mobility: Needs Assistance Bed Mobility: Supine to Sit     Supine to sit: Mod assist     General bed mobility comments: Mod assist for BLEs and trunk cues for sequencing, sleeps in recliner at baseline    Transfers Overall transfer level: Needs assistance Equipment used: Rolling walker (2 wheels) Transfers: Sit to/from  Stand, Bed to chair/wheelchair/BSC Sit to Stand: Mod assist, +2 physical assistance, +2 safety/equipment     Step pivot transfers: Min assist, +2 safety/equipment     General transfer comment: Mod +2 to come to stand once in standing min assist to manage RW. Difficulty sequencing often rocking posteriorly per pt doing it on purpose      Balance Overall balance assessment: Needs assistance Sitting-balance support: No upper extremity supported, Feet supported Sitting balance-Leahy Scale: Fair     Standing balance support: Bilateral upper extremity supported, Reliant on assistive device for balance, During functional activity Standing balance-Leahy Scale: Poor             ADL either performed or assessed with clinical judgement   ADL Overall ADL's : Needs assistance/impaired Eating/Feeding: Set up;Sitting   Grooming: Set up;Sitting           Upper Body Dressing : Set up;Sitting   Lower Body Dressing: Moderate assistance;Sit to/from stand Lower Body Dressing Details (indicate cue type and reason): Assist to thread legs and stand, unable to don socks (at baseline wears slip on shoes) Toilet Transfer: Moderate assistance;+2 for physical assistance;Rolling walker (2 wheels) Toilet Transfer Details (indicate cue type and reason): Simulated in room mod +2 for inital STS, then min assist Toileting- Clothing Manipulation and Hygiene: Total assistance;Sit to/from stand Toileting - Clothing Manipulation Details (indicate cue type and reason): Total assist for hygiene in standing, incontinent of BM     Functional mobility during ADLs: Moderate assistance;Rolling walker (2 wheels) General ADL Comments: Decreased activity tolerance     Vision  Patient Visual Report: No change from baseline Vision Assessment?: No apparent visual deficits            Pertinent Vitals/Pain Pain Assessment Pain Assessment: Faces Faces Pain Scale: Hurts little more Pain Location: Back Pain  Descriptors / Indicators: Discomfort Pain Intervention(s): Monitored during session     Extremity/Trunk Assessment Upper Extremity Assessment Upper Extremity Assessment: Generalized weakness   Lower Extremity Assessment Lower Extremity Assessment: Defer to PT evaluation   Cervical / Trunk Assessment Cervical / Trunk Assessment: Kyphotic   Communication Communication Communication: No apparent difficulties;Impaired Factors Affecting Communication: Hearing impaired   Cognition Arousal: Alert Behavior During Therapy: WFL for tasks assessed/performed Cognition: Cognition impaired   Orientation impairments: Time Awareness: Online awareness impaired Memory impairment (select all impairments): Short-term memory Attention impairment (select first level of impairment): Sustained attention Executive functioning impairment (select all impairments): Sequencing, Reasoning, Problem solving OT - Cognition Comments: Pt difficulty sustaining attention, easily distracted during session, reporting conflicting statements during PLOF, difficulty problem solving during functional tasks       Following commands: Impaired Following commands impaired: Follows one step commands inconsistently     Cueing  General Comments   Cueing Techniques: Verbal cues  Re-educated pt on use of incentive spirometer and flutter valve           Home Living Family/patient expects to be discharged to:: Private residence Living Arrangements: Children;Other relatives Available Help at Discharge: Family Type of Home: House Home Access: Stairs to enter [employer] of Steps: 4 Entrance Stairs-Rails: Can reach both Home Layout: One level     Bathroom Shower/Tub: Producer, television/film/video: Handicapped height Bathroom Accessibility: Yes How Accessible: Accessible via walker Home Equipment: Rolling Walker (2 wheels);Cane - single point;Shower seat;Grab bars - tub/shower          Prior  Functioning/Environment Prior Level of Function : Independent/Modified Independent             Mobility Comments: No AD ADLs Comments: No assist, wears slip on shoes    OT Problem List: Decreased range of motion;Decreased strength;Decreased activity tolerance;Impaired balance (sitting and/or standing);Cardiopulmonary status limiting activity   OT Treatment/Interventions: Self-care/ADL training;Neuromuscular education;Energy conservation;DME and/or AE instruction;Therapeutic activities;Patient/family education;Balance training      OT Goals(Current goals can be found in the care plan section)   Acute Rehab OT Goals Patient Stated Goal: To get better OT Goal Formulation: With patient Time For Goal Achievement: 11/26/23 Potential to Achieve Goals: Good   OT Frequency:  Min 2X/week    Co-evaluation PT/OT/SLP Co-Evaluation/Treatment: Yes Reason for Co-Treatment: For patient/therapist safety;To address functional/ADL transfers PT goals addressed during session: Mobility/safety with mobility OT goals addressed during session: ADL's and self-care      AM-PAC OT 6 Clicks Daily Activity     Outcome Measure Help from another person eating meals?: None Help from another person taking care of personal grooming?: A Little Help from another person toileting, which includes using toliet, bedpan, or urinal?: Total Help from another person bathing (including washing, rinsing, drying)?: A Lot Help from another person to put on and taking off regular upper body clothing?: A Little Help from another person to put on and taking off regular lower body clothing?: A Lot 6 Click Score: 15   End of Session Equipment Utilized During Treatment: Gait belt;Rolling walker (2 wheels);Oxygen Nurse Communication: Mobility status  Activity Tolerance: Patient tolerated treatment well Patient left: in chair;with call bell/phone within reach;with chair alarm set  OT Visit Diagnosis: Unsteadiness on  feet (R26.81);Other abnormalities of gait and mobility (R26.89);Muscle weakness (generalized) (M62.81)                Time: 8746-8670 OT Time Calculation (min): 36 min Charges:  OT General Charges $OT Visit: 1 Visit OT Evaluation $OT Eval Moderate Complexity: 1 Mod  Ann Graham, OT  Acute Rehabilitation Services Office [phone] Secure chat preferred   Ann Graham 11/12/2023, 4:19 PM

## 2023-11-12 NOTE — Progress Notes (Addendum)
 Progress Note Patient: Ann Graham FMW:[id number] DOB: [birth year] DOA: 11/10/2023     2 DOS: the patient was seen and examined on 11/12/2023   Brief hospital course: Ann Graham is a 68 y.o. female with medical history significant for hypertension, hyperlipidemia, depression, anxiety, asthma, and BMI 47 who presents with 3 days of progressive shortness of breath, fatigue, loss of appetite, and jaundice. She is admitted to hospital for right sided pneumonia, jaundice, debility.  Assessment and Plan: Right sided pneumonia Hypoxia- weaned to 1L [place] today. Has significant wheezing so ordered breathing treatments. Febrile this morning.  Continue Ceftriaxone , doxycycline . Supplemental oxygen to maintain saturation above 92%. Follow blood cultures, urine cultures. Encourage incentive spirometry, out of bed.  L1 vertebral compression- Discussed with Dr. Lindalee over phone, this is chronic issue, no intervention need. Continue pain control. PT/ OT.  Hyperbilirubinemia- unknown etiology Improving. RUQ sono unremarkable. LFT normalised.  Hypokalemia- Replete as needed.  Hypertension- Coreg , Losartan  resumed.  Hyperlipidemia- Started crestor  therapy.  Morbid Obesity BMI 46.94 Hypoxia possibly due to undiagnosed OSA. Advised to follow pulmonary for sleep study. Diet, exercise and weight reduction advised.    Out of bed to chair. Incentive spirometry. Nursing supportive care. Fall, aspiration precautions. Diet:  Diet Orders (From admission, onward)     Start     Ordered   11/11/23 0409  Diet regular Room service appropriate? Yes; Fluid consistency: Thin  Diet effective now       Question Answer Comment  Room service appropriate? Yes   Fluid consistency: Thin      11/11/23 0413           DVT prophylaxis:   Level of care: Progressive   Code Status: Full Code  Subjective: Patient reports feeling awful. She has shortness of breath. Denies chest pain.   Physical  Exam: Vitals:   11/11/23 1947 11/12/23 0026 11/12/23 0514 11/12/23 0700  BP: (!) 157/65 (!) 163/78 (!) 154/90 (!) 157/85  Pulse: 91 94 92 93  Resp: 19 18 18 20   Temp: 98.2 F (36.8 C) 99.3 F (37.4 C) 99.4 F (37.4 C) (!) 100.9 F (38.3 C)  TempSrc: Oral Oral Oral Oral  SpO2: 99% 94% 92% 91%  Weight:      Height:        General - Elderly morbidly obese, female, mild respiratory distress HEENT - PERRLA, EOMI, atraumatic head, non tender sinuses. Lung - Clear, basal rales, rhonchi, auditory wheezes. Heart - S1, S2 heard, no murmurs, rubs, trace pedal edema. Abdomen - Soft, non tender, obese, bowel sounds good Neuro - Alert, awake and oriented, non focal exam. Skin - Warm and dry.  Data Reviewed:    Latest Ref Rng & Units 11/12/2023    2:31 AM 11/11/2023    6:13 AM 11/10/2023    6:03 PM  CBC  WBC 4.0 - 10.5 K/uL 7.0  8.2    Hemoglobin 12.0 - 15.0 g/dL 89.9  88.0  87.3   Hematocrit 36.0 - 46.0 % 31.1  36.5  37.0   Platelets 150 - 400 K/uL 158  144        Latest Ref Rng & Units 11/12/2023    2:31 AM 11/11/2023    6:13 AM 11/10/2023    6:03 PM  BMP  Glucose 70 - 99 mg/dL 92  90    BUN 8 - 23 mg/dL 22  25    Creatinine 9.55 - 1.00 mg/dL 9.09  9.02    Sodium 864 - 145  mmol/L 134  134  133   Potassium 3.5 - 5.1 mmol/L 3.3  3.4  3.5   Chloride 98 - 111 mmol/L 101  100    CO2 22 - 32 mmol/L 23  24    Calcium  8.9 - 10.3 mg/dL 7.9  8.0     US  Abdomen Limited RUQ (LIVER/GB) Result Date: 11/11/2023 CLINICAL DATA:  Jaundice EXAM: ULTRASOUND ABDOMEN LIMITED RIGHT UPPER QUADRANT COMPARISON:  CT abdomen pelvis 11/10/2023 FINDINGS: Gallbladder: Gallstones: None Sludge: None Gallbladder Wall: Within normal limits Pericholecystic fluid: None Sonographic Murphy's Sign: Negative per technologist Common bile duct: Diameter: 4 mm Liver: Parenchymal echogenicity: Normal Contours: Normal Lesions: None Portal vein: Patent.  Hepatopetal flow Other: None. IMPRESSION: No significant sonographic  abnormality of the liver or gallbladder. Electronically Signed   By: Aliene Lloyd M.D.   On: 11/11/2023 07:48   CT Angio Chest PE W and/or Wo Contrast Result Date: 11/10/2023 CLINICAL DATA:  Sepsis.  Shortness of breath and weakness. EXAM: CT ANGIOGRAPHY CHEST CT ABDOMEN AND PELVIS WITH CONTRAST TECHNIQUE: Multidetector CT imaging of the chest was performed using the standard protocol during bolus administration of intravenous contrast. Multiplanar CT image reconstructions and MIPs were obtained to evaluate the vascular anatomy. Multidetector CT imaging of the abdomen and pelvis was performed using the standard protocol during bolus administration of intravenous contrast. RADIATION DOSE REDUCTION: This exam was performed according to the departmental dose-optimization program which includes automated exposure control, adjustment of the mA and/or kV according to patient size and/or use of iterative reconstruction technique. CONTRAST:  OMNIPAQUE  IOHEXOL  350 MG/ML SOLN COMPARISON:  CT abdomen 04/16/2021 and chest radiograph 11/10/2023 FINDINGS: CTA CHEST FINDINGS Cardiovascular: No filling defect is identified in the pulmonary arterial tree to suggest pulmonary embolus. Atheromatous vascular calcification of the aortic arch and left anterior descending coronary artery. Moderate cardiomegaly. Mediastinum/Nodes: 0.9 cm right infrahilar lymph node. Additional scattered likely reactive lymph nodes in the right hilum right subcarinal region. Lungs/Pleura: Consolidation in much of the right lower lobe laterally suspicious for bacterial pneumonia process. No current cavitation. Bandlike density favoring atelectasis anteriorly in the left lower lobe. Trace right pleural effusion. Musculoskeletal: No significant findings Review of the MIP images confirms the above findings. CT ABDOMEN and PELVIS FINDINGS Hepatobiliary: Unremarkable Pancreas: Unremarkable Spleen: Unremarkable Adrenals/Urinary Tract: No significant  findings Stomach/Bowel: Unremarkable.  Normal appendix. Vascular/Lymphatic: Atherosclerosis is present, including aortoiliac atherosclerotic disease. Upper normal size left external iliac nodes but similar to the 04/16/2021 exam. Reproductive: Unremarkable Other: No supplemental non-categorized findings. Musculoskeletal: Progressive collapse of the L1 vertebral body now with complete collapse centrally along with some middle column involvement and up to 5 mm of chronic posterior retropulsion. The L1 vertebra demonstrated about 45% compression on the prior CT scan from 04/16/2021. 3 mm grade 1 degenerative anterolisthesis at L4-5. Loss of intervertebral disc height and disc desiccation at L5-S1. Review of the MIP images confirms the above findings. IMPRESSION: 1. No filling defect is identified in the pulmonary arterial tree to suggest pulmonary embolus. 2. Consolidation in much of the right lower lobe laterally suspicious for bacterial pneumonia process. No current cavitation. Reactive lymph nodes along the right hilar/infrahilar region. 3. Trace right pleural effusion. 4. Moderate cardiomegaly. 5. Progressive collapse of the L1 vertebral body now with complete collapse centrally along with some middle column involvement and up to 5 mm of chronic posterior retropulsion. The L1 vertebra demonstrated about 45% compression on the prior CT scan from 04/16/2021. 6.  Aortic Atherosclerosis (ICD10-I70.0). Electronically Signed  By: Ryan Salvage M.D.   On: 11/10/2023 21:26   CT ABDOMEN PELVIS W CONTRAST Result Date: 11/10/2023 CLINICAL DATA:  Sepsis.  Shortness of breath and weakness. EXAM: CT ANGIOGRAPHY CHEST CT ABDOMEN AND PELVIS WITH CONTRAST TECHNIQUE: Multidetector CT imaging of the chest was performed using the standard protocol during bolus administration of intravenous contrast. Multiplanar CT image reconstructions and MIPs were obtained to evaluate the vascular anatomy. Multidetector CT imaging of the  abdomen and pelvis was performed using the standard protocol during bolus administration of intravenous contrast. RADIATION DOSE REDUCTION: This exam was performed according to the departmental dose-optimization program which includes automated exposure control, adjustment of the mA and/or kV according to patient size and/or use of iterative reconstruction technique. CONTRAST:  OMNIPAQUE  IOHEXOL  350 MG/ML SOLN COMPARISON:  CT abdomen 04/16/2021 and chest radiograph 11/10/2023 FINDINGS: CTA CHEST FINDINGS Cardiovascular: No filling defect is identified in the pulmonary arterial tree to suggest pulmonary embolus. Atheromatous vascular calcification of the aortic arch and left anterior descending coronary artery. Moderate cardiomegaly. Mediastinum/Nodes: 0.9 cm right infrahilar lymph node. Additional scattered likely reactive lymph nodes in the right hilum right subcarinal region. Lungs/Pleura: Consolidation in much of the right lower lobe laterally suspicious for bacterial pneumonia process. No current cavitation. Bandlike density favoring atelectasis anteriorly in the left lower lobe. Trace right pleural effusion. Musculoskeletal: No significant findings Review of the MIP images confirms the above findings. CT ABDOMEN and PELVIS FINDINGS Hepatobiliary: Unremarkable Pancreas: Unremarkable Spleen: Unremarkable Adrenals/Urinary Tract: No significant findings Stomach/Bowel: Unremarkable.  Normal appendix. Vascular/Lymphatic: Atherosclerosis is present, including aortoiliac atherosclerotic disease. Upper normal size left external iliac nodes but similar to the 04/16/2021 exam. Reproductive: Unremarkable Other: No supplemental non-categorized findings. Musculoskeletal: Progressive collapse of the L1 vertebral body now with complete collapse centrally along with some middle column involvement and up to 5 mm of chronic posterior retropulsion. The L1 vertebra demonstrated about 45% compression on the prior CT scan from  04/16/2021. 3 mm grade 1 degenerative anterolisthesis at L4-5. Loss of intervertebral disc height and disc desiccation at L5-S1. Review of the MIP images confirms the above findings. IMPRESSION: 1. No filling defect is identified in the pulmonary arterial tree to suggest pulmonary embolus. 2. Consolidation in much of the right lower lobe laterally suspicious for bacterial pneumonia process. No current cavitation. Reactive lymph nodes along the right hilar/infrahilar region. 3. Trace right pleural effusion. 4. Moderate cardiomegaly. 5. Progressive collapse of the L1 vertebral body now with complete collapse centrally along with some middle column involvement and up to 5 mm of chronic posterior retropulsion. The L1 vertebra demonstrated about 45% compression on the prior CT scan from 04/16/2021. 6.  Aortic Atherosclerosis (ICD10-I70.0). Electronically Signed   By: Ryan Salvage M.D.   On: 11/10/2023 21:26   DG Chest 2 View Result Date: 11/10/2023 CLINICAL DATA:  sob EXAM: CHEST - 2 VIEW COMPARISON:  Chest x-ray 06/23/2020 FINDINGS: Limited evaluation due to overlapping osseous structures and overlying soft tissues. The heart and mediastinal contours are within normal limits. Bibasilar atelectasis. No focal consolidation. No pulmonary edema. Query bilateral pleural effusion. No pneumothorax. No acute osseous abnormality. IMPRESSION: Query bilateral pleural effusions. Limited evaluation due to overlapping osseous structures and overlying soft tissues. Electronically Signed   By: Morgane  Naveau M.D.   On: 11/10/2023 17:39   Family Communication: none at bedside  Disposition: Status is: Inpatient Remains inpatient appropriate because: IV antibiotics, PT/OT  Planned Discharge Destination: Home with Home Health     Time spent: 50 minutes  Author: Marien LITTIE Piety, MD 11/12/2023 8:46 AM Secure chat 7am to 7pm For on call review [email or web address].

## 2023-11-12 NOTE — Progress Notes (Addendum)
 Pt c/o chest pain, right sided under breast. VSS (see flowsheet). EKG done, NSR. Primary RN notified.

## 2023-11-12 NOTE — Evaluation (Signed)
 Physical Therapy Evaluation Patient Details Name: Ann Graham MRN: [id number] DOB: May 02, 1955 Today's Date: 11/12/2023  History of Present Illness  Pt is a 68 y.o. female admitted 8/26 for SOB and jaundice. Admitted for R sided pneumonia. Chronic L1 vertebral compression.  PMH: HTN, HLD, morbid obesity  Clinical Impression  Pt admitted with/for SOB and jaudice with work up showing PNA.  Pt is presently very deconditioned, needing 2 person min to mod assist.  Expect pt to improve well in standing and gait, but will likely have to continue sleeping in the recliner due to pt has much difficulty sitting up and moving/scooting toward the EOB.Pt currently limited functionally due to the problems listed below.  (see problems list.)  Pt will benefit from PT to maximize function and safety to be able to get home safely with available assist .        If plan is discharge home, recommend the following: A lot of help with walking and/or transfers;A lot of help with bathing/dressing/bathroom;Assistance with cooking/housework;Assist for transportation   Can travel by private vehicle   No    Equipment Recommendations    Recommendations for Other Services  OT consult    Functional Status Assessment Patient has had a recent decline in their functional status and demonstrates the ability to make significant improvements in function in a reasonable and predictable amount of time.     Precautions / Restrictions Precautions Precautions: Fall Recall of Precautions/Restrictions: Impaired Restrictions Weight Bearing Restrictions Per Provider Order: No      Mobility  Bed Mobility Overal bed mobility: Needs Assistance Bed Mobility: Supine to Sit     Supine to sit: Mod assist     General bed mobility comments: Mod assist for BLEs and trunk cues for sequencing, sleeps in recliner at baseline    Transfers Overall transfer level: Needs assistance Equipment used: Rolling walker (2  wheels) Transfers: Sit to/from Stand, Bed to chair/wheelchair/BSC Sit to Stand: Mod assist, +2 physical assistance, +2 safety/equipment   Step pivot transfers: Min assist, +2 safety/equipment       General transfer comment: Mod +2 to come to stand once in standing min assist to manage RW. Difficulty sequencing often rocking posteriorly per pt doing it on purpose    Ambulation/Gait Ambulation/Gait assistance: Mod assist Gait Distance (Feet): 10 Feet Assistive device: Rolling walker (2 wheels) Gait Pattern/deviations: Step-through pattern, Decreased step length - right, Decreased step length - left, Decreased stride length       General Gait Details: short mildly unsteady steps, cued for proximity to the RW,  much too far from the RW, dyspnea 2+ to 3/4  Stairs            Wheelchair Mobility     Tilt Bed    Modified Rankin (Stroke Patients Only)       Balance Overall balance assessment: Needs assistance Sitting-balance support: No upper extremity supported, Feet supported Sitting balance-Leahy Scale: Fair     Standing balance support: Bilateral upper extremity supported, Reliant on assistive device for balance, During functional activity Standing balance-Leahy Scale: Poor Standing balance comment: pt not confident in standing without 1-2 hands on the RW.                             Pertinent Vitals/Pain Pain Assessment Pain Assessment: Faces Faces Pain Scale: Hurts little more Pain Location: Back Pain Descriptors / Indicators: Discomfort Pain Intervention(s): Monitored during session, Repositioned  Home Living Family/patient expects to be discharged to:: Private residence Living Arrangements: Children;Other relatives Available Help at Discharge: Family Type of Home: House Home Access: Stairs to enter Entrance Stairs-Rails: Can reach both Entrance Stairs-Number of Steps: 4   Home Layout: One level Home Equipment: Agricultural consultant (2  wheels);Cane - single point;Shower seat;Grab bars - tub/shower      Prior Function Prior Level of Function : Independent/Modified Independent             Mobility Comments: No AD ADLs Comments: No assist, wears slip on shoes     Extremity/Trunk Assessment   Upper Extremity Assessment Upper Extremity Assessment: Generalized weakness    Lower Extremity Assessment Lower Extremity Assessment: Defer to PT evaluation    Cervical / Trunk Assessment Cervical / Trunk Assessment: Kyphotic  Communication   Communication Communication: No apparent difficulties;Impaired Factors Affecting Communication: Hearing impaired    Cognition Arousal: Alert Behavior During Therapy: WFL for tasks assessed/performed                             Following commands: Impaired Following commands impaired: Follows one step commands inconsistently     Cueing Cueing Techniques: Verbal cues     General Comments General comments (skin integrity, edema, etc.): Re-educated pt on use of incentive spirometer and flutter valve    Exercises     Assessment/Plan    PT Assessment Patient needs continued PT services  PT Problem List Decreased strength;Decreased activity tolerance;Decreased balance;Decreased mobility;Decreased knowledge of use of DME;Decreased safety awareness;Cardiopulmonary status limiting activity       PT Treatment Interventions DME instruction;Gait training;Functional mobility training;Therapeutic activities;Balance training;Patient/family education;Therapeutic exercise    PT Goals (Current goals can be found in the Care Plan section)  Acute Rehab PT Goals Patient Stated Goal: able to do for myself PT Goal Formulation: With patient Time For Goal Achievement: 11/26/23 Potential to Achieve Goals: Good    Frequency Min 3X/week     Co-evaluation PT/OT/SLP Co-Evaluation/Treatment: Yes Reason for Co-Treatment: For patient/therapist safety;To address functional/ADL  transfers PT goals addressed during session: Mobility/safety with mobility OT goals addressed during session: ADL's and self-care       AM-PAC PT 6 Clicks Mobility  Outcome Measure Help needed turning from your back to your side while in a flat bed without using bedrails?: A Lot Help needed moving from lying on your back to sitting on the side of a flat bed without using bedrails?: A Lot Help needed moving to and from a bed to a chair (including a wheelchair)?: A Lot Help needed standing up from a chair using your arms (e.g., wheelchair or bedside chair)?: A Lot Help needed to walk in hospital room?: A Lot Help needed climbing 3-5 steps with a railing? : None 6 Click Score: 14    End of Session Equipment Utilized During Treatment: Oxygen Activity Tolerance: Patient tolerated treatment well;Patient limited by fatigue Patient left: in chair;with call bell/phone within reach Nurse Communication: Mobility status PT Visit Diagnosis: Other abnormalities of gait and mobility (R26.89);History of falling (Z91.81);Difficulty in walking, not elsewhere classified (R26.2)    Time: 8746-8670 PT Time Calculation (min) (ACUTE ONLY): 36 min   Charges:   PT Evaluation $PT Eval Moderate Complexity: 1 Mod   PT General Charges $$ ACUTE PT VISIT: 1 Visit         11/12/2023  India HERO., PT Acute Rehabilitation Services [phone]  (office)  Vinie GAILS Ahilyn Nell 11/12/2023, 5:46 PM

## 2023-11-12 NOTE — Plan of Care (Signed)

## 2023-11-12 NOTE — Progress Notes (Signed)
 Provided pt IS and flutter valve. Educated pt how to use these and their benefit. Pt demonstrated correctly. Pt is ax0x4.   Amado GORMAN Arabia, RN

## 2023-11-13 DIAGNOSIS — J189 Pneumonia, unspecified organism: Secondary | ICD-10-CM | POA: Diagnosis not present

## 2023-11-13 LAB — COMPREHENSIVE METABOLIC PANEL WITH GFR
ALT: 25 U/L (ref 0–44)
AST: 42 U/L — ABNORMAL HIGH (ref 15–41)
Albumin: 2.1 g/dL — ABNORMAL LOW (ref 3.5–5.0)
Alkaline Phosphatase: 80 U/L (ref 38–126)
Anion gap: 11 (ref 5–15)
BUN: 13 mg/dL (ref 8–23)
CO2: 22 mmol/L (ref 22–32)
Calcium: 8.1 mg/dL — ABNORMAL LOW (ref 8.9–10.3)
Chloride: 100 mmol/L (ref 98–111)
Creatinine, Ser: 0.65 mg/dL (ref 0.44–1.00)
GFR, Estimated: >60 mL/min (ref >60)
Glucose, Bld: 94 mg/dL (ref 70–99)
Potassium: 3.9 mmol/L (ref 3.5–5.1)
Sodium: 133 mmol/L — ABNORMAL LOW (ref 135–145)
Total Bilirubin: 1.2 mg/dL (ref 0.0–1.2)
Total Protein: 6.1 g/dL — ABNORMAL LOW (ref 6.5–8.1)

## 2023-11-13 LAB — CBC
HCT: 30.3 % — ABNORMAL LOW (ref 36.0–46.0)
Hemoglobin: 9.9 g/dL — ABNORMAL LOW (ref 12.0–15.0)
MCH: 26.4 pg (ref 26.0–34.0)
MCHC: 32.7 g/dL (ref 30.0–36.0)
MCV: 80.8 fL (ref 80.0–100.0)
Platelets: 138 K/uL — ABNORMAL LOW (ref 150–400)
RBC: 3.75 MIL/uL — ABNORMAL LOW (ref 3.87–5.11)
RDW: 15.4 % (ref 11.5–15.5)
WBC: 8.2 K/uL (ref 4.0–10.5)
nRBC: 0 % (ref 0.0–0.2)

## 2023-11-13 LAB — LEGIONELLA PNEUMOPHILA SEROGP 1 UR AG: L. pneumophila Serogp 1 Ur Ag: POSITIVE — AB

## 2023-11-13 MED ORDER — DOXYCYCLINE HYCLATE 100 MG PO TABS
100.0000 mg | ORAL_TABLET | Freq: Two times a day (BID) | ORAL | Status: DC
  Administered 2023-11-13: 100 mg via ORAL
  Filled 2023-11-13: qty 1

## 2023-11-13 MED ORDER — LACTINEX PO CHEW: 1.0000 | CHEWABLE_TABLET | Freq: Three times a day (TID) | ORAL | 0 refills | Status: AC

## 2023-11-13 MED ORDER — LEVOFLOXACIN 750 MG PO TABS: 750.0000 mg | ORAL_TABLET | Freq: Every day | ORAL | 0 refills | Status: AC

## 2023-11-13 MED ORDER — MELATONIN 5 MG PO TABS: 5.0000 mg | ORAL_TABLET | Freq: Every evening | ORAL | 0 refills | Status: AC | PRN

## 2023-11-13 MED ORDER — LEVOFLOXACIN 750 MG PO TABS
750.0000 mg | ORAL_TABLET | Freq: Every day | ORAL | Status: DC
  Administered 2023-11-13 – 2023-11-14 (×2): 750 mg via ORAL
  Filled 2023-11-13 (×2): qty 1

## 2023-11-13 NOTE — Progress Notes (Signed)
 Pt alert with confusion. Keeps thinking she is at her home, re-oriented but then gets confused again.

## 2023-11-13 NOTE — Discharge Summary (Addendum)
 Physician Discharge Summary   Patient: Ann Graham MRN: [id number] DOB: [birth year]  Admit date:     11/10/2023  Discharge date: 11/14/23  Discharge Physician: Concepcion Riser   PCP: Marvene Prentice SAUNDERS, FNP   Recommendations at discharge:  Follow-up with PCP 1 week Continue cardioactive antibiotics and continue current meds Continue PT OT, fall precautions Pulmonary eval for sleep study as outpatient    Discharge Diagnoses: Principal Problem:   Right lower lobe pneumonia Active Problems:   HYPERTENSION   Hypokalemia   OSA (obstructive sleep apnea)   Generalized anxiety disorder   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HCC)   Jaundice   Asthma, chronic   Ann Graham is a 68 y.o. female with medical history significant for hypertension, hyperlipidemia, depression, anxiety, asthma, and BMI 47 who presents with 3 days of progressive shortness of breath, fatigue, loss of appetite, and jaundice. She is admitted to hospital for right sided pneumonia, jaundice, debility.    Right sided pneumonia Hypoxia/ wheezing-> resolved satting 92% on room air Continue Ceftriaxone , doxycycline  >>> switch to p.o. Levaquin , concluding 7 days of oral antibiotics  Supplemental oxygen to maintain saturation above 92%. Follow blood cultures, urine cultures. Encourage incentive spirometry, out of bed. -Legionella and urine was also positive-patient be covered with Levaquin . She will need sleep study as outpatient.   L1 vertebral compression- Discussed with Dr. Lindalee over phone, this is chronic issue, no intervention need. Continue pain control. PT/ OT.   Urine tract infection  Urine culture > 100K Klebsiella pneumonia, resistant to ampicillin, neurology -Patient has been treated with IV Rocephin  and doxycycline  - Will switch to p.o.  hyperbilirubinemia- unknown etiology Improving. RUQ sono unremarkable. LFT normalised.   Hypokalemia- Replete as needed.   Hypertension- Coreg ,  Losartan  resumed.   Hyperlipidemia- Started crestor  therapy.   Morbid Obesity BMI 46.94 Hypoxia possibly due to undiagnosed OSA. Advised to follow pulmonary for sleep study. Diet, exercise and weight reduction advised.      Disposition: Skilled nursing facility Diet recommendation:  Discharge Diet Orders (From admission, onward)     Start     Ordered   11/13/23 0000  Diet - low sodium heart healthy        11/13/23 1055           Cardiac diet DISCHARGE MEDICATION: Allergies as of 11/14/2023       Reactions   Hydrocodone-acetaminophen  Rash   Naproxen Sodium Shortness Of Breath, Swelling   Amoxicillin Hives, Rash   yeast   Codeine  Hives   Hydrocodone Hives   Lisinopril  Swelling, Cough   Congestion, fluid build up   Other Hives   Powder coating **patient doesn't remember this**   Alendronate Sodium Diarrhea   Furosemide     Kidney pain   Naproxen Swelling   Hydrochlorothiazide Rash        Medication List     STOP taking these medications    Bromfenac Sodium 0.07 % Soln   Difluprednate 0.05 % Emul   famotidine 20 MG tablet Commonly known as: PEPCID   hydrALAZINE 10 MG tablet Commonly known as: APRESOLINE       TAKE these medications    albuterol  108 (90 Base) MCG/ACT inhaler Commonly known as: VENTOLIN  HFA Inhale 2 puffs into the lungs every 6 (six) hours as needed for wheezing or shortness of breath.   alendronate 70 MG tablet Commonly known as: FOSAMAX Take 70 mg by mouth.   Besivance 0.6 % Susp Generic drug: Besifloxacin HCl Place  1 drop into the left eye 4 (four) times daily.   carvedilol  3.125 MG tablet Commonly known as: COREG  Take 3.125 mg by mouth 2 (two) times daily with a meal.   escitalopram  20 MG tablet Commonly known as: LEXAPRO  Take 20 mg by mouth.   furosemide  20 MG tablet Commonly known as: LASIX  Take 20 mg by mouth daily.   gabapentin  300 MG capsule Commonly known as: NEURONTIN  Take 300 mg by mouth at  bedtime.   gatifloxacin  0.5 % Soln Commonly known as: ZYMAXID  Place 1 drop into the left eye 4 (four) times daily.   lactobacillus acidophilus & bulgar chewable tablet Chew 1 tablet by mouth 3 (three) times daily with meals for 10 days.   levofloxacin  750 MG tablet Commonly known as: Levaquin  Take 1 tablet (750 mg total) by mouth daily for 4 days.   losartan  100 MG tablet Commonly known as: COZAAR  Take 100 mg by mouth daily.   melatonin 5 MG Tabs Take 1 tablet (5 mg total) by mouth at bedtime as needed.   pantoprazole  40 MG tablet Commonly known as: PROTONIX  Take 40 mg by mouth every morning.   rosuvastatin  10 MG tablet Commonly known as: CRESTOR  Take 10 mg by mouth daily.               Durable Medical Equipment  (From admission, onward)           Start     Ordered   11/13/23 1135  For home use only DME Bedside commode  Once       Question:  Patient needs a bedside commode to treat with the following condition  Answer:  Weakness   11/13/23 1134            Contact information for follow-up providers     [facility] Follow up.   Specialty: Home Health Services Why: Agency will call you to set up apt times Contact information: [address] [place] [address] [phone]         [facility] Follow up.   Why: Bedside commode Contact information: [address] [place] [address] [phone]         Marvene Prentice SAUNDERS, FNP. Go in 1 week(s).   Specialty: Family Medicine Why: 11/20/2023 @10 :30 A.M. Contact information: [address] [place] [address] [phone]              Contact information for after-discharge care     Destination     [facility] .   Contact information: [address] [place]  [address] [phone]                    Discharge Exam: Fredricka Weights   11/11/23 1510 11/13/23  0428 11/14/23 0440  Weight: 118.4 kg 120 kg 118.9 kg        General:  AAO x 3,  cooperative, no distress;   HEENT:  Normocephalic, PERRL, otherwise with in Normal limits   Neuro:  CNII-XII intact. , normal motor and sensation, reflexes intact   Lungs:   Clear to auscultation BL, Respirations unlabored,  No wheezes / crackles  Cardio:    S1/S2, RRR, No murmure, No Rubs or Gallops   Abdomen:  Soft, non-tender, bowel sounds active all four quadrants, no guarding or peritoneal signs.  Muscular  skeletal:  Limited exam -global generalized weaknesses - in bed, able to move all 4 extremities,  2+ pulses,  symmetric, No pitting edema  Skin:  Dry, warm to touch, negative for any Rashes,  Wounds: Please see nursing documentation         Condition at discharge: fair  The results of significant diagnostics from this hospitalization (including imaging, microbiology, ancillary and laboratory) are listed below for reference.   Imaging Studies: US  Abdomen Limited RUQ (LIVER/GB) Result Date: 11/11/2023 CLINICAL DATA:  Jaundice EXAM: ULTRASOUND ABDOMEN LIMITED RIGHT UPPER QUADRANT COMPARISON:  CT abdomen pelvis 11/10/2023 FINDINGS: Gallbladder: Gallstones: None Sludge: None Gallbladder Wall: Within normal limits Pericholecystic fluid: None Sonographic Murphy's Sign: Negative per technologist Common bile duct: Diameter: 4 mm Liver: Parenchymal echogenicity: Normal Contours: Normal Lesions: None Portal vein: Patent.  Hepatopetal flow Other: None. IMPRESSION: No significant sonographic abnormality of the liver or gallbladder. Electronically Signed   By: Aliene Lloyd M.D.   On: 11/11/2023 07:48   CT Angio Chest PE W and/or Wo Contrast Result Date: 11/10/2023 CLINICAL DATA:  Sepsis.  Shortness of breath and weakness. EXAM: CT ANGIOGRAPHY CHEST CT ABDOMEN AND PELVIS WITH CONTRAST TECHNIQUE: Multidetector CT imaging of the chest was performed using the standard protocol during bolus administration of  intravenous contrast. Multiplanar CT image reconstructions and MIPs were obtained to evaluate the vascular anatomy. Multidetector CT imaging of the abdomen and pelvis was performed using the standard protocol during bolus administration of intravenous contrast. RADIATION DOSE REDUCTION: This exam was performed according to the departmental dose-optimization program which includes automated exposure control, adjustment of the mA and/or kV according to patient size and/or use of iterative reconstruction technique. CONTRAST:  OMNIPAQUE  IOHEXOL  350 MG/ML SOLN COMPARISON:  CT abdomen 04/16/2021 and chest radiograph 11/10/2023 FINDINGS: CTA CHEST FINDINGS Cardiovascular: No filling defect is identified in the pulmonary arterial tree to suggest pulmonary embolus. Atheromatous vascular calcification of the aortic arch and left anterior descending coronary artery. Moderate cardiomegaly. Mediastinum/Nodes: 0.9 cm right infrahilar lymph node. Additional scattered likely reactive lymph nodes in the right hilum right subcarinal region. Lungs/Pleura: Consolidation in much of the right lower lobe laterally suspicious for bacterial pneumonia process. No current cavitation. Bandlike density favoring atelectasis anteriorly in the left lower lobe. Trace right pleural effusion. Musculoskeletal: No significant findings Review of the MIP images confirms the above findings. CT ABDOMEN and PELVIS FINDINGS Hepatobiliary: Unremarkable Pancreas: Unremarkable Spleen: Unremarkable Adrenals/Urinary Tract: No significant findings Stomach/Bowel: Unremarkable.  Normal appendix. Vascular/Lymphatic: Atherosclerosis is present, including aortoiliac atherosclerotic disease. Upper normal size left external iliac nodes but similar to the 04/16/2021 exam. Reproductive: Unremarkable Other: No supplemental non-categorized findings. Musculoskeletal: Progressive collapse of the L1 vertebral body now with complete collapse centrally along with some middle  column involvement and up to 5 mm of chronic posterior retropulsion. The L1 vertebra demonstrated about 45% compression on the prior CT scan from 04/16/2021. 3 mm grade 1 degenerative anterolisthesis at L4-5. Loss of intervertebral disc height and disc desiccation at L5-S1. Review of the MIP images confirms the above findings. IMPRESSION: 1. No filling defect is identified in the pulmonary arterial tree to suggest pulmonary embolus. 2. Consolidation in much of the right lower lobe laterally suspicious for bacterial pneumonia process. No current cavitation. Reactive lymph nodes along the right hilar/infrahilar region. 3. Trace right pleural effusion. 4. Moderate cardiomegaly. 5. Progressive collapse of the L1 vertebral body now with complete collapse centrally along with some middle column involvement and up to 5 mm of chronic posterior retropulsion. The L1 vertebra demonstrated about 45% compression on the prior CT scan from 04/16/2021. 6.  Aortic Atherosclerosis (ICD10-I70.0). Electronically Signed   By: Ryan Salvage M.D.   On: 11/10/2023 21:26   CT ABDOMEN PELVIS W CONTRAST Result Date: 11/10/2023 CLINICAL DATA:  Sepsis.  Shortness of breath and weakness. EXAM: CT ANGIOGRAPHY CHEST CT ABDOMEN AND PELVIS WITH CONTRAST TECHNIQUE: Multidetector CT imaging of the chest was performed using the standard protocol during bolus administration of intravenous contrast. Multiplanar CT image reconstructions and MIPs were obtained to evaluate the vascular anatomy. Multidetector CT imaging of the abdomen and pelvis was performed using the standard protocol during bolus administration of intravenous contrast. RADIATION DOSE REDUCTION: This exam was performed according to the departmental dose-optimization program which includes automated exposure control, adjustment of the mA and/or kV according to patient size and/or use of iterative reconstruction technique. CONTRAST:  OMNIPAQUE  IOHEXOL  350 MG/ML SOLN COMPARISON:   CT abdomen 04/16/2021 and chest radiograph 11/10/2023 FINDINGS: CTA CHEST FINDINGS Cardiovascular: No filling defect is identified in the pulmonary arterial tree to suggest pulmonary embolus. Atheromatous vascular calcification of the aortic arch and left anterior descending coronary artery. Moderate cardiomegaly. Mediastinum/Nodes: 0.9 cm right infrahilar lymph node. Additional scattered likely reactive lymph nodes in the right hilum right subcarinal region. Lungs/Pleura: Consolidation in much of the right lower lobe laterally suspicious for bacterial pneumonia process. No current cavitation. Bandlike density favoring atelectasis anteriorly in the left lower lobe. Trace right pleural effusion. Musculoskeletal: No significant findings Review of the MIP images confirms the above findings. CT ABDOMEN and PELVIS FINDINGS Hepatobiliary: Unremarkable Pancreas: Unremarkable Spleen: Unremarkable Adrenals/Urinary Tract: No significant findings Stomach/Bowel: Unremarkable.  Normal appendix. Vascular/Lymphatic: Atherosclerosis is present, including aortoiliac atherosclerotic disease. Upper normal size left external iliac nodes but similar to the 04/16/2021 exam. Reproductive: Unremarkable Other: No supplemental non-categorized findings. Musculoskeletal: Progressive collapse of the L1 vertebral body now with complete collapse centrally along with some middle column involvement and up to 5 mm of chronic posterior retropulsion. The L1 vertebra demonstrated about 45% compression on the prior CT scan from 04/16/2021. 3 mm grade 1 degenerative anterolisthesis at L4-5. Loss of intervertebral disc height and disc desiccation at L5-S1. Review of the MIP images confirms the above findings. IMPRESSION: 1. No filling defect is identified in the pulmonary arterial tree to suggest pulmonary embolus. 2. Consolidation in much of the right lower lobe laterally suspicious for bacterial pneumonia process. No current cavitation. Reactive lymph nodes  along the right hilar/infrahilar region. 3. Trace right pleural effusion. 4. Moderate cardiomegaly. 5. Progressive collapse of the L1 vertebral body now with complete collapse centrally along with some middle column involvement and up to 5 mm of chronic posterior retropulsion. The L1 vertebra demonstrated about 45% compression on the prior CT scan from 04/16/2021. 6.  Aortic Atherosclerosis (ICD10-I70.0). Electronically Signed   By: Ryan Salvage M.D.   On: 11/10/2023 21:26   DG Chest 2 View Result Date: 11/10/2023 CLINICAL DATA:  sob EXAM: CHEST - 2 VIEW COMPARISON:  Chest x-ray 06/23/2020 FINDINGS: Limited evaluation due to overlapping osseous structures and overlying soft tissues. The heart and mediastinal contours are within normal limits. Bibasilar atelectasis. No focal consolidation. No pulmonary edema. Query bilateral pleural effusion. No pneumothorax. No acute osseous abnormality. IMPRESSION: Query bilateral pleural effusions. Limited evaluation due to overlapping osseous structures and overlying soft tissues. Electronically Signed   By: Morgane  Naveau M.D.   On: 11/10/2023 17:39    Microbiology: Results for orders placed or performed during the hospital encounter of 11/10/23  Resp panel by RT-PCR (RSV, Flu A&B, Covid) Anterior Nasal Swab  Status: None   Collection Time: 11/10/23  5:52 PM   Specimen: Anterior Nasal Swab  Result Value Ref Range Status   SARS Coronavirus 2 by RT PCR NEGATIVE NEGATIVE Final   Influenza A by PCR NEGATIVE NEGATIVE Final   Influenza B by PCR NEGATIVE NEGATIVE Final    Comment: (NOTE) The Xpert Xpress SARS-CoV-2/FLU/RSV plus assay is intended as an aid in the diagnosis of influenza from Nasopharyngeal swab specimens and should not be used as a sole basis for treatment. Nasal washings and aspirates are unacceptable for Xpert Xpress SARS-CoV-2/FLU/RSV testing.  Fact Sheet for Patients: BloggerCourse.com  Fact Sheet for  Healthcare Providers: SeriousBroker.it  This test is not yet approved or cleared by the [place]  FDA and has been authorized for detection and/or diagnosis of SARS-CoV-2 by FDA under an Emergency Use Authorization (EUA). This EUA will remain in effect (meaning this test can be used) for the duration of the COVID-19 declaration under Section 564(b)(1) of the Act, 21 U.S.C. section 360bbb-3(b)(1), unless the authorization is terminated or revoked.     Resp Syncytial Virus by PCR NEGATIVE NEGATIVE Final    Comment: (NOTE) Fact Sheet for Patients: BloggerCourse.com  Fact Sheet for Healthcare Providers: SeriousBroker.it  This test is not yet approved or cleared by the [place]  FDA and has been authorized for detection and/or diagnosis of SARS-CoV-2 by FDA under an Emergency Use Authorization (EUA). This EUA will remain in effect (meaning this test can be used) for the duration of the COVID-19 declaration under Section 564(b)(1) of the Act, 21 U.S.C. section 360bbb-3(b)(1), unless the authorization is terminated or revoked.  Performed at [facility], [address], [place], [place] [address]   Blood Culture (routine x 2)     Status: None (Preliminary result)   Collection Time: 11/10/23  5:52 PM   Specimen: BLOOD  Result Value Ref Range Status   Specimen Description BLOOD SITE NOT SPECIFIED  Final   Special Requests   Final    BOTTLES DRAWN AEROBIC AND ANAEROBIC Blood Culture adequate volume   Culture   Final    NO GROWTH 4 DAYS Performed at [facility], [address] [place], [place] [address]    Report Status PENDING  Incomplete  Blood Culture (routine x 2)     Status: None (Preliminary result)   Collection Time: 11/10/23  5:52 PM   Specimen: BLOOD  Result Value Ref Range Status   Specimen Description BLOOD BLOOD RIGHT ARM  Final   Special Requests   Final    BOTTLES DRAWN  AEROBIC AND ANAEROBIC Blood Culture adequate volume   Culture   Final    NO GROWTH 4 DAYS Performed at [facility], [address], [place], [place] [address]    Report Status PENDING  Incomplete  Urine Culture     Status: Abnormal   Collection Time: 11/10/23  9:22 PM   Specimen: Urine, Random  Result Value Ref Range Status   Specimen Description URINE, RANDOM  Final   Special Requests   Final    NONE Reflexed from [id number] Performed at [facility], [address] [place], [place] [address]    Culture >=100,000 COLONIES/mL KLEBSIELLA PNEUMONIAE (A)  Final   Report Status 11/12/2023 FINAL  Final   Organism ID, Bacteria KLEBSIELLA PNEUMONIAE (A)  Final      Susceptibility   Klebsiella pneumoniae - MIC*    AMPICILLIN >=32 RESISTANT Resistant     CEFAZOLIN (URINE)  Value in next row Sensitive      2 SENSITIVEThis is a modified FDA-approved test that has been validated and its performance characteristics determined by the reporting laboratory.  This laboratory is certified under the Clinical Laboratory Improvement Amendments CLIA as qualified to perform high complexity clinical laboratory testing.    CEFEPIME Value in next row Sensitive      2 SENSITIVEThis is a modified FDA-approved test that has been validated and its performance characteristics determined by the reporting laboratory.  This laboratory is certified under the Clinical Laboratory Improvement Amendments CLIA as qualified to perform high complexity clinical laboratory testing.    ERTAPENEM Value in next row Sensitive      2 SENSITIVEThis is a modified FDA-approved test that has been validated and its performance characteristics determined by the reporting laboratory.  This laboratory is certified under the Clinical Laboratory Improvement Amendments CLIA as qualified to perform high complexity clinical laboratory testing.    CEFTRIAXONE  Value in next row Sensitive      2 SENSITIVEThis is a modified FDA-approved test  that has been validated and its performance characteristics determined by the reporting laboratory.  This laboratory is certified under the Clinical Laboratory Improvement Amendments CLIA as qualified to perform high complexity clinical laboratory testing.    CIPROFLOXACIN Value in next row Sensitive      2 SENSITIVEThis is a modified FDA-approved test that has been validated and its performance characteristics determined by the reporting laboratory.  This laboratory is certified under the Clinical Laboratory Improvement Amendments CLIA as qualified to perform high complexity clinical laboratory testing.    GENTAMICIN Value in next row Sensitive      2 SENSITIVEThis is a modified FDA-approved test that has been validated and its performance characteristics determined by the reporting laboratory.  This laboratory is certified under the Clinical Laboratory Improvement Amendments CLIA as qualified to perform high complexity clinical laboratory testing.    NITROFURANTOIN Value in next row Resistant      2 SENSITIVEThis is a modified FDA-approved test that has been validated and its performance characteristics determined by the reporting laboratory.  This laboratory is certified under the Clinical Laboratory Improvement Amendments CLIA as qualified to perform high complexity clinical laboratory testing.    TRIMETH/SULFA Value in next row Sensitive      2 SENSITIVEThis is a modified FDA-approved test that has been validated and its performance characteristics determined by the reporting laboratory.  This laboratory is certified under the Clinical Laboratory Improvement Amendments CLIA as qualified to perform high complexity clinical laboratory testing.    AMPICILLIN/SULBACTAM Value in next row Sensitive      2 SENSITIVEThis is a modified FDA-approved test that has been validated and its performance characteristics determined by the reporting laboratory.  This laboratory is certified under the Clinical Laboratory  Improvement Amendments CLIA as qualified to perform high complexity clinical laboratory testing.    PIP/TAZO Value in next row Sensitive ug/mL     <=4 SENSITIVEThis is a modified FDA-approved test that has been validated and its performance characteristics determined by the reporting laboratory.  This laboratory is certified under the Clinical Laboratory Improvement Amendments CLIA as qualified to perform high complexity clinical laboratory testing.    MEROPENEM Value in next row Sensitive      <=4 SENSITIVEThis is a modified FDA-approved test that has been validated and its performance characteristics determined by the reporting laboratory.  This laboratory is certified under the Clinical Laboratory Improvement Amendments CLIA as qualified to perform high complexity  clinical laboratory testing.    * >=100,000 COLONIES/mL KLEBSIELLA PNEUMONIAE    Labs: CBC: Recent Labs  Lab 11/10/23 1627 11/10/23 1803 11/11/23 0613 11/12/23 0231 11/13/23 0216 11/14/23 0232  WBC 11.1*  --  8.2 7.0 8.2 8.0  HGB 11.6* 12.6 11.9* 10.0* 9.9* 9.3*  HCT 35.8* 37.0 36.5 31.1* 30.3* 28.9*  MCV 82.1  --  82.2 82.9 80.8 81.9  PLT 151  --  144* 158 138* 185   Basic Metabolic Panel: Recent Labs  Lab 11/10/23 1627 11/10/23 1752 11/10/23 1803 11/11/23 0613 11/12/23 0231 11/13/23 0216 11/14/23 0232  NA 127*  --  133* 134* 134* 133* 136  K 3.0*  --  3.5 3.4* 3.3* 3.9 3.3*  CL 93*  --   --  100 101 100 100  CO2 21*  --   --  24 23 22 25   GLUCOSE 119*  --   --  90 92 94 95  BUN 26*  --   --  25* 22 13 10   CREATININE 0.93  --   --  0.97 0.90 0.65 0.53  CALCIUM  8.2*  --   --  8.0* 7.9* 8.1* 8.1*  MG  --  2.0  --   --   --   --   --    Liver Function Tests: Recent Labs  Lab 11/10/23 1752 11/11/23 0613 11/12/23 0231 11/13/23 0216 11/14/23 0232  AST 42* 37 39 42* 43*  ALT 23 20 22 25 28   ALKPHOS 84 74 73 80 86  BILITOT 4.1* 2.8* 1.4* 1.2 0.9  PROT 7.2 6.3* 6.1* 6.1* 5.7*  ALBUMIN 3.0* 2.5* 2.2* 2.1*  1.9*   CBG: No results for input(s): GLUCAP in the last 168 hours.  Discharge time spent: greater than 45 minutes.  Signed: Concepcion Riser, MD [facility] 11/14/2023

## 2023-11-13 NOTE — TOC Progression Note (Addendum)
 Transition of Care [facility]) - Progression Note    Patient Details  Name: Ann Graham MRN: [id number] Date of Birth: September 03, 1955  Transition of Care [facility]) CM/SW Contact  Luise JAYSON Pan, [place] Phone Number: 11/13/2023, 2:40 PM  Clinical Narrative:   Per PT, updating recommendation to SNF. CSW spoke with patient at bedside, who is oriented x4, about update in PT recs. Patient is agreeable to SNF.  CSW left voicemail for patients daughter.  2:39PM Patients daughter, [name], returned CSWs call. CSW discussed update in PT recs for SNF. [name] is agreeable and would like patient to go to [facility] if able. CSW informed [name] of facilities recent COVID outbreak. [name] stated she is okay with patient going to [facility] as it is close to family. CSW inquired if [facility] cannot accept, does she have a second choice. [name] stated no. CSW asked [name]'s permission to fax referrals to other SNFs. [name] stated yes, but wants patient as close to her as possible. [name] provided CSW with her email to send bed offers to [email or web address].   3:34 PM CSW emailed bed offers to [place]. CSW informed [name] [facility] does not have bed availability.   3:56 PM [name] chose [facility]. Facility notified. Facility will need DC summary by noon tomorrow 8/30. Weekend contact is Darrien [phone].   4:09 PM Facility inquired about CPAP needs. CSW informed that patient is not using CPAP this hospitalization as there are no settings in flowsheet and Respiratory is not following.   CSW will continue to follow.    Expected Discharge Plan: Skilled Nursing Facility Barriers to Discharge: SNF Pending bed offer               Expected Discharge Plan and Services In-house Referral: NA Discharge Planning Services: CM Consult Post Acute Care Choice: NA Living arrangements for the past 2 months: Single Family Home Expected Discharge Date: 11/13/23               DME Arranged: N/A DME Agency: NA        HH Arranged: NA HH Agency: NA         Social Drivers of Health (SDOH) Interventions SDOH Screenings   Food Insecurity: No Food Insecurity (11/11/2023)  Housing: Low Risk  (11/11/2023)  Transportation Needs: No Transportation Needs (11/11/2023)  Utilities: Not At Risk (11/11/2023)  Depression (PHQ2-9): Low Risk  (07/31/2021)  Financial Resource Strain: Low Risk  (04/02/2023)   Received from [facility]  Social Connections: Moderately Integrated (11/11/2023)  Stress: No Stress Concern Present (04/02/2023)   Received from [facility]  Tobacco Use: Low Risk  (11/11/2023)    Readmission Risk Interventions     No data to display

## 2023-11-13 NOTE — TOC Progression Note (Addendum)
 Transition of Care [facility]) - Progression Note    Patient Details  Name: Ann Graham MRN: [id number] Date of Birth: 1955/04/10  Transition of Care [facility]) CM/SW Contact  Waddell Barnie Rama, RN Phone Number: 11/13/2023, 2:22 PM  Clinical Narrative:    Per Physical therapy today, rec SNF , today , not safe to go home without 24 hr supervision.  CSW aware and will contact daughter, [name] .  MD is aware also so may not dc today.  Per CSW patient is agreeable to SNF.   Expected Discharge Plan: Home w Home Health Services Barriers to Discharge: Continued Medical Work up               Expected Discharge Plan and Services In-house Referral: NA Discharge Planning Services: CM Consult Post Acute Care Choice: NA Living arrangements for the past 2 months: Single Family Home Expected Discharge Date: 11/13/23               DME Arranged: N/A DME Agency: NA       HH Arranged: NA HH Agency: NA         Social Drivers of Health (SDOH) Interventions SDOH Screenings   Food Insecurity: No Food Insecurity (11/11/2023)  Housing: Low Risk  (11/11/2023)  Transportation Needs: No Transportation Needs (11/11/2023)  Utilities: Not At Risk (11/11/2023)  Depression (PHQ2-9): Low Risk  (07/31/2021)  Financial Resource Strain: Low Risk  (04/02/2023)   Received from [facility]  Social Connections: Moderately Integrated (11/11/2023)  Stress: No Stress Concern Present (04/02/2023)   Received from [facility]  Tobacco Use: Low Risk  (11/11/2023)    Readmission Risk Interventions     No data to display

## 2023-11-13 NOTE — Progress Notes (Signed)
 Physical Therapy Treatment Patient Details Name: Ann Graham MRN: [id number] DOB: 04/25/1955 Today's Date: 11/13/2023   History of Present Illness Pt is a 68 y.o. female admitted 8/26 for SOB and jaundice. Admitted for R sided pneumonia. Chronic L1 vertebral compression.  PMH: HTN, HLD, morbid obesity    PT Comments  Pt was agitated and confused on arrival.  Some valid points made about her care and others were likely a perception of her confused state.  Pt is weak and deconditioned with SOB and wheezing.  All movements are effortful and pt has now related that she will definitely not have 24/7 assist or even sufficient assist once home.  As pt is requiring mod assist overall for general mobility, patient will benefit from continued inpatient follow up therapy, <3 hours/day and should not go directly home.      If plan is discharge home, recommend the following: A lot of help with walking and/or transfers;A lot of help with bathing/dressing/bathroom;Assistance with cooking/housework;Assist for transportation   Can travel by private vehicle        Equipment Recommendations   (TBD)    Recommendations for Other Services       Precautions / Restrictions Precautions Precautions: Fall Recall of Precautions/Restrictions: Impaired     Mobility  Bed Mobility Overal bed mobility: Needs Assistance Bed Mobility: Supine to Sit     Supine to sit: Mod assist     General bed mobility comments: up via L UE to sitting with pivot to square up on the EOB, all with moderate assist.    Transfers Overall transfer level: Needs assistance Equipment used: Rolling walker (2 wheels) Transfers: Sit to/from Stand Sit to Stand: Mod assist, +2 physical assistance, +2 safety/equipment   Step pivot transfers: Min assist, +2 safety/equipment       General transfer comment: cues for hand placement    Ambulation/Gait Ambulation/Gait assistance: Mod assist Gait Distance (Feet): 35  Feet Assistive device: Rolling walker (2 wheels) Gait Pattern/deviations: Step-through pattern, Decreased step length - right, Decreased step length - left, Decreased stride length Gait velocity: slow Gait velocity interpretation: <1.31 ft/sec, indicative of household ambulator   General Gait Details: again, short mildly unsteady steps, cued for proximity to the RW and posture, pt was visibly SOB, but less than yesterday   Stairs             Wheelchair Mobility     Tilt Bed    Modified Rankin (Stroke Patients Only)       Balance Overall balance assessment: Needs assistance Sitting-balance support: No upper extremity supported, Feet supported Sitting balance-Leahy Scale: Fair (to poor)     Standing balance support: Single extremity supported, Bilateral upper extremity supported, During functional activity, Reliant on assistive device for balance Standing balance-Leahy Scale: Poor Standing balance comment: Work in standing over ~5 trials to complete peri-care for significnat soft runny stool.  Pt was able to use washcloth in 1 hand to wipe her front and maintain some balance in the RW with assist, Otherwise pt stood for several minutes at EOB during peri-care over 3-4 stands                            Communication Communication Communication: No apparent difficulties;Impaired Factors Affecting Communication: Hearing impaired  Cognition Arousal: Alert Behavior During Therapy: WFL for tasks assessed/performed   PT - Cognitive impairments:  (pt is confused and having trouble completing all her thoughts)  Following commands: Impaired Following commands impaired: Follows one step commands inconsistently    Cueing Cueing Techniques: Verbal cues  Exercises      General Comments General comments (skin integrity, edema, etc.): Sats maintained in the lower 90's on RA      Pertinent Vitals/Pain Pain Assessment Pain  Assessment: Faces Faces Pain Scale: Hurts little more Pain Location: back flank Pain Descriptors / Indicators: Discomfort Pain Intervention(s): Monitored during session    Home Living                          Prior Function            PT Goals (current goals can now be found in the care plan section) Acute Rehab PT Goals PT Goal Formulation: With patient Time For Goal Achievement: 11/26/23 Potential to Achieve Goals: Good Progress towards PT goals: Progressing toward goals    Frequency    Min 3X/week      PT Plan      Co-evaluation              AM-PAC PT 6 Clicks Mobility   Outcome Measure  Help needed turning from your back to your side while in a flat bed without using bedrails?: A Lot Help needed moving from lying on your back to sitting on the side of a flat bed without using bedrails?: A Lot Help needed moving to and from a bed to a chair (including a wheelchair)?: A Lot Help needed standing up from a chair using your arms (e.g., wheelchair or bedside chair)?: A Lot Help needed to walk in hospital room?: A Lot Help needed climbing 3-5 steps with a railing? : Total 6 Click Score: 11    End of Session   Activity Tolerance: Patient tolerated treatment well;Patient limited by fatigue Patient left: in chair;with call bell/phone within reach Nurse Communication: Mobility status PT Visit Diagnosis: Other abnormalities of gait and mobility (R26.89);History of falling (Z91.81);Difficulty in walking, not elsewhere classified (R26.2)     Time: 1335-1405 PT Time Calculation (min) (ACUTE ONLY): 30 min  Charges:    $Therapeutic Activity: 23-37 mins PT General Charges $$ ACUTE PT VISIT: 1 Visit                     11/13/2023  India HERO., PT Acute Rehabilitation Services [phone]  (office)   Vinie GAILS Sevilla Murtagh 11/13/2023, 4:12 PM

## 2023-11-13 NOTE — NC FL2 (Signed)
 [place]  MEDICAID FL2 LEVEL OF CARE FORM     IDENTIFICATION  Patient Name: Ann Graham Birthdate: 1955-10-08 Sex: female Admission Date (Current Location): 11/10/2023  [facility] and [place] Number:  Producer, television/film/video and Address:  The . [facility], [address], [place], [place] [address]      Provider Number: [id number]  Attending Physician Name and Address:  Willette Adriana LABOR, MD  Relative Name and Phone Number:  Deronda Setter (Daughter)  [phone]    Current Level of Care: Hospital Recommended Level of Care: Skilled Nursing Facility Prior Approval Number:    Date Approved/Denied:   PASRR Number: [id number]  Discharge Plan: SNF    Current Diagnoses: Patient Active Problem List   Diagnosis Date Noted   Jaundice 11/11/2023   Asthma, chronic 11/11/2023   Right lower lobe pneumonia 11/10/2023   Macular edema of left eye 05/26/2023   Age-related osteoporosis without current pathological fracture 07/31/2021   Wedge compression fracture of unspecified lumbar vertebra, initial encounter for closed fracture (HCC) 07/31/2021   Allergic rhinitis 05/10/2021   Edema 05/10/2021   Family history of malignant neoplasm of gastrointestinal tract 05/10/2021   Generalized anxiety disorder 05/10/2021   History of adenomatous polyp of colon 05/10/2021   History of diverticulitis 05/10/2021   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HCC) 05/10/2021   Urinary incontinence 05/10/2021   UTI (urinary tract infection) 04/18/2021   OSA (obstructive sleep apnea) 04/18/2021   Chronic diastolic CHF (congestive heart failure) (HCC) 04/17/2021   Closed compression fracture of body of L1 vertebra (HCC) 04/17/2021   Compression fracture of L1 lumbar vertebra (HCC) 04/16/2021   Frequent falls 04/16/2021   Morbid obesity (HCC) 04/16/2021   Hypocalcemia 04/16/2021   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   Pain in right hand 03/28/2021   Retention of urine 06/19/2020    Diastolic dysfunction 06/14/2012   Chest pain at rest 06/13/2012   Hypokalemia 06/13/2012   Cardiomegaly 06/13/2012   Idiopathic intracranial hypertension 09/03/2011   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   Empty sella syndrome (HCC) 02/20/2011   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   Papilledema 12/20/2010   Hypercholesterolemia 08/12/2008   OBESITY 08/12/2008   HYPERTENSION 08/12/2008    Orientation RESPIRATION BLADDER Height & Weight     Self, Time, Situation, Place  Normal Incontinent, External catheter Weight: 264 lb 8.8 oz (120 kg) Height:  5' 3 (160 cm)  BEHAVIORAL SYMPTOMS/MOOD NEUROLOGICAL BOWEL NUTRITION STATUS      Incontinent Diet (See discharge Summary)  AMBULATORY STATUS COMMUNICATION OF NEEDS Skin   Extensive Assist Verbally Normal                       Personal Care Assistance Level of Assistance  Bathing, Feeding, Dressing Bathing Assistance: Limited assistance Feeding assistance: Independent Dressing Assistance: Limited assistance     Functional Limitations Info  Sight, Hearing, Speech Sight Info: Adequate Hearing Info: Impaired Speech Info: Adequate    SPECIAL CARE FACTORS FREQUENCY  PT (By licensed PT), OT (By licensed OT)     PT Frequency: 5x week OT Frequency: 5x week            Contractures Contractures Info: Not present    Additional Factors Info  Code Status, Allergies, Psychotropic Code Status Info: Full Allergies Info: Hydrocodone-acetaminophen , Naproxen Sodium, Amoxicillin, Codeine , Hydrocodone, Lisinopril , Other, Alendronate Sodium, Furosemide , Naproxen, Hydrochlorothiazide Psychotropic Info: lexapro , gabapentin          Current Medications (11/13/2023):  This is the current hospital  active medication list Current Facility-Administered Medications  Medication Dose Route Frequency Provider Last Rate Last Admin   acetaminophen  (TYLENOL ) tablet 650 mg  650 mg Oral Q6H PRN Opyd, Timothy S, MD   650 mg at 11/11/23 1546   Or    acetaminophen  (TYLENOL ) suppository 650 mg  650 mg Rectal Q6H PRN Opyd, Timothy S, MD       albuterol  (PROVENTIL ) (2.5 MG/3ML) 0.083% nebulizer solution 2.5 mg  2.5 mg Nebulization Q4H PRN Opyd, Timothy S, MD   2.5 mg at 11/11/23 1546   budesonide  (PULMICORT ) nebulizer solution 0.5 mg  0.5 mg Nebulization BID Lenon Marien CROME, MD   0.5 mg at 11/13/23 9195   cefTRIAXone  (ROCEPHIN ) 2 g in sodium chloride  0.9 % 100 mL IVPB  2 g Intravenous Q24H Opyd, Timothy S, MD   Stopped at 11/12/23 1826   doxycycline  (VIBRA -TABS) tablet 100 mg  100 mg Oral Q12H Shahmehdi, Seyed A, MD   100 mg at 11/13/23 0841   enoxaparin  (LOVENOX ) injection 60 mg  60 mg Subcutaneous Q24H Opyd, Timothy S, MD   60 mg at 11/13/23 9460   escitalopram  (LEXAPRO ) tablet 20 mg  20 mg Oral Daily Lenon Marien CROME, MD   20 mg at 11/13/23 0841   fentaNYL  (SUBLIMAZE ) injection 12.5-50 mcg  12.5-50 mcg Intravenous Q2H PRN Opyd, Timothy S, MD       gabapentin  (NEURONTIN ) capsule 300 mg  300 mg Oral QHS Opyd, Timothy S, MD   300 mg at 11/12/23 2108   gatifloxacin  (ZYMAXID ) 0.5 % ophthalmic drops 1 drop  1 drop Left Eye QID Opyd, Timothy S, MD   1 drop at 11/13/23 1356   losartan  (COZAAR ) tablet 100 mg  100 mg Oral Daily Sreeram, Narendranath, MD   100 mg at 11/13/23 0841   melatonin tablet 5 mg  5 mg Oral QHS PRN Rathore, Vasundhra, MD   5 mg at 11/12/23 2107   Oral care mouth rinse  15 mL Mouth Rinse PRN Darci Pore, MD       oxyCODONE  (Oxy IR/ROXICODONE ) immediate release tablet 5 mg  5 mg Oral Q4H PRN Opyd, Timothy S, MD   5 mg at 11/11/23 1546   pantoprazole  (PROTONIX ) EC tablet 40 mg  40 mg Oral q morning Opyd, Timothy S, MD   40 mg at 11/13/23 0841   polyethylene glycol (MIRALAX  / GLYCOLAX ) packet 17 g  17 g Oral Daily PRN Opyd, Timothy S, MD       prochlorperazine  (COMPAZINE ) injection 5 mg  5 mg Intravenous Q6H PRN Opyd, Evalene RAMAN, MD       rosuvastatin  (CRESTOR ) tablet 10 mg  10 mg Oral Daily Opyd, Timothy S, MD   10 mg  at 11/13/23 0841   sodium chloride  flush (NS) 0.9 % injection 3 mL  3 mL Intravenous Q12H Opyd, Timothy S, MD   3 mL at 11/13/23 1000     Discharge Medications: Please see discharge summary for a list of discharge medications.  Relevant Imaging Results:  Relevant Lab Results:   Additional Information SSN# [id number]  Alcee Sipos C Vale Mousseau, LCSWA

## 2023-11-13 NOTE — Plan of Care (Signed)

## 2023-11-13 NOTE — Care Management Important Message (Signed)
 Important Message  Patient Details  Name: Ann Graham MRN: [id number] Date of Birth: [birth year]   Important Message Given:  Yes - Medicare IM     [name] 11/13/2023, 12:27 PM

## 2023-11-13 NOTE — TOC Progression Note (Addendum)
 Transition of Care [facility]) - Progression Note    Patient Details  Name: Ann Graham MRN: [id number] Date of Birth: Aug 19, 1955  [facility]) CM/SW Contact  Waddell Barnie Rama, RN Phone Number: 11/13/2023, 11:24 AM  Clinical Narrative:    NCM spoke with patient at the bedside , offered choice for HHPT, HHOT, she states she has no preference,  she also needs BSC, no preference for the agency.  She states her  POA, [name], to call her, does not get off work til 6 pm and then [name] would have to go to her house to put her animals up so they will not jump all over her  when she gets home from being glad to see her and make her fall.  NCM made referral to [facility] for HHRN, HHPT, HHOT.  He is able to take referral.  Soc will begin 24 to 48 hrs post dc.  NCM made referral to Ryan with [employer] for bsc, this will be brought to room.     Expected Discharge Plan: Home w Home Health Services Barriers to Discharge: Continued Medical Work up               Expected Discharge Plan and Services In-house Referral: NA Discharge Planning Services: CM Consult Post Acute Care Choice: NA Living arrangements for the past 2 months: Single Family Home Expected Discharge Date: 11/13/23               DME Arranged: N/A DME Agency: NA       HH Arranged: NA HH Agency: NA         Social Drivers of Health (SDOH) Interventions SDOH Screenings   Food Insecurity: No Food Insecurity (11/11/2023)  Housing: Low Risk  (11/11/2023)  Transportation Needs: No Transportation Needs (11/11/2023)  Utilities: Not At Risk (11/11/2023)  Depression (PHQ2-9): Low Risk  (07/31/2021)  Financial Resource Strain: Low Risk  (04/02/2023)   Received from [facility]  Social Connections: Moderately Integrated (11/11/2023)  Stress: No Stress Concern Present (04/02/2023)   Received from [facility]  Tobacco Use: Low Risk  (11/11/2023)    Readmission Risk Interventions     No data to display

## 2023-11-14 DIAGNOSIS — R531 Weakness: Secondary | ICD-10-CM

## 2023-11-14 DIAGNOSIS — E871 Hypo-osmolality and hyponatremia: Secondary | ICD-10-CM | POA: Diagnosis not present

## 2023-11-14 DIAGNOSIS — E876 Hypokalemia: Secondary | ICD-10-CM | POA: Diagnosis not present

## 2023-11-14 DIAGNOSIS — J189 Pneumonia, unspecified organism: Secondary | ICD-10-CM | POA: Diagnosis not present

## 2023-11-14 LAB — COMPREHENSIVE METABOLIC PANEL WITH GFR
ALT: 28 U/L (ref 0–44)
AST: 43 U/L — ABNORMAL HIGH (ref 15–41)
Albumin: 1.9 g/dL — ABNORMAL LOW (ref 3.5–5.0)
Alkaline Phosphatase: 86 U/L (ref 38–126)
Anion gap: 11 (ref 5–15)
BUN: 10 mg/dL (ref 8–23)
CO2: 25 mmol/L (ref 22–32)
Calcium: 8.1 mg/dL — ABNORMAL LOW (ref 8.9–10.3)
Chloride: 100 mmol/L (ref 98–111)
Creatinine, Ser: 0.53 mg/dL (ref 0.44–1.00)
GFR, Estimated: >60 mL/min (ref >60)
Glucose, Bld: 95 mg/dL (ref 70–99)
Potassium: 3.3 mmol/L — ABNORMAL LOW (ref 3.5–5.1)
Sodium: 136 mmol/L (ref 135–145)
Total Bilirubin: 0.9 mg/dL (ref 0.0–1.2)
Total Protein: 5.7 g/dL — ABNORMAL LOW (ref 6.5–8.1)

## 2023-11-14 LAB — CBC
HCT: 28.9 % — ABNORMAL LOW (ref 36.0–46.0)
Hemoglobin: 9.3 g/dL — ABNORMAL LOW (ref 12.0–15.0)
MCH: 26.3 pg (ref 26.0–34.0)
MCHC: 32.2 g/dL (ref 30.0–36.0)
MCV: 81.9 fL (ref 80.0–100.0)
Platelets: 185 K/uL (ref 150–400)
RBC: 3.53 MIL/uL — ABNORMAL LOW (ref 3.87–5.11)
RDW: 15.4 % (ref 11.5–15.5)
WBC: 8 K/uL (ref 4.0–10.5)
nRBC: 0 % (ref 0.0–0.2)

## 2023-11-14 NOTE — TOC Transition Note (Signed)
 Transition of Care [facility]) - Discharge Note   Patient Details  Name: Ann Graham MRN: [id number] Date of Birth: 1955-08-22  Transition of Care [facility]) CM/SW Contact:  Britt JULIANNA Bennetts, LCSW Phone Number: 11/14/2023, 10:07 AM   Clinical Narrative:    Patient will DC to: [facility] Anticipated DC date: 11/14/2023 Family notified: Voicemail left for pt. Daughter Pensions consultant by: DONALDA   Per MD patient ready for DC to SNF. RN to call report prior to discharge [phone] room 508). RN, patient's family, and facility notified of DC. Discharge Summary sent to facility. DC packet on chart. Ambulance transport will be requested for patient.   CSW will sign off for now as social work intervention is no longer needed. Please consult us  again if new needs arise.      Barriers to Discharge: SNF Pending bed offer   Patient Goals and CMS Choice Patient states their goals for this hospitalization and ongoing recovery are:: lives with daughter , SIL and grand son   Choice offered to / list presented to : NA      Discharge Placement                       Discharge Plan and Services Additional resources added to the After Visit Summary for   In-house Referral: NA Discharge Planning Services: CM Consult Post Acute Care Choice: NA          DME Arranged: N/A DME Agency: NA       HH Arranged: NA HH Agency: NA        Social Drivers of Health (SDOH) Interventions SDOH Screenings   Food Insecurity: No Food Insecurity (11/11/2023)  Housing: Low Risk  (11/11/2023)  Transportation Needs: No Transportation Needs (11/11/2023)  Utilities: Not At Risk (11/11/2023)  Depression (PHQ2-9): Low Risk  (07/31/2021)  Financial Resource Strain: Low Risk  (04/02/2023)   [facility] from [facility]  Social Connections: Moderately Integrated (11/11/2023)  Stress: No Stress Concern Present (04/02/2023)   [facility]  Tobacco Use: Low Risk  (11/11/2023)     Readmission Risk  Interventions     No data to display

## 2023-11-14 NOTE — Discharge Summary (Signed)
 Physician Discharge Summary   Patient: Ann Graham MRN: [id number] DOB: [birth year]  Admit date:     11/10/2023  Discharge date: 11/14/23  Discharge Physician: Ann Graham   PCP: Ann Graham   Recommendations at discharge:  Follow-up with PCP 1 week Continue cardioactive antibiotics and continue current meds Continue PT OT, fall precautions Pulmonary eval for sleep study as outpatient    Discharge Diagnoses: Principal Problem:   Right lower lobe pneumonia Active Problems:   HYPERTENSION   Hypokalemia   OSA (obstructive sleep apnea)   Generalized anxiety disorder   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HCC)   Jaundice   Asthma, chronic   Ann Graham is a 68 y.o. female with medical history significant for hypertension, hyperlipidemia, depression, anxiety, asthma, and BMI 47 who presents with 3 days of progressive shortness of breath, fatigue, loss of appetite, and jaundice. She is admitted to hospital for right sided pneumonia, jaundice, debility.    Right sided pneumonia Hypoxia/ wheezing-> resolved satting 92% on room air Continue Ceftriaxone , doxycycline  >>> switch to p.o. Levaquin , concluding 7 days of oral antibiotics  Supplemental oxygen to maintain saturation above 92%. Follow blood cultures, urine cultures. Encourage incentive spirometry, out of bed. -Legionella and urine was also positive-patient be covered with Levaquin . She will need sleep study as outpatient.   L1 vertebral compression- Discussed with Ann Graham over phone, this is chronic issue, no intervention need. Continue pain control. PT/ OT.   Urine tract infection  Urine culture > 100K Klebsiella pneumonia, resistant to ampicillin, neurology -Patient has been treated with IV Rocephin  and doxycycline  - Will switch to p.o.  hyperbilirubinemia- unknown etiology Improving. RUQ sono unremarkable. LFT normalised.   Hypokalemia- Replete as needed.   Hypertension- Coreg ,  Losartan  resumed.   Hyperlipidemia- Started crestor  therapy.   Morbid Obesity BMI 46.94 Hypoxia possibly due to undiagnosed OSA. Advised to follow pulmonary for sleep study. Diet, exercise and weight reduction advised.      Disposition: Skilled nursing facility Diet recommendation:  Discharge Diet Orders (From admission, onward)     Start     Ordered   11/13/23 0000  Diet - low sodium heart healthy        11/13/23 1055           Cardiac diet DISCHARGE MEDICATION: Allergies as of 11/14/2023       Reactions   Hydrocodone-acetaminophen  Rash   Naproxen Sodium Shortness Of Breath, Swelling   Amoxicillin Hives, Rash   yeast   Codeine  Hives   Hydrocodone Hives   Lisinopril  Swelling, Cough   Congestion, fluid build up   Other Hives   Powder coating **patient doesn't remember this**   Alendronate Sodium Diarrhea   Furosemide     Kidney pain   Naproxen Swelling   Hydrochlorothiazide Rash        Medication List     STOP taking these medications    Bromfenac Sodium 0.07 % Soln   Difluprednate 0.05 % Emul   famotidine 20 MG tablet Commonly known as: PEPCID   hydrALAZINE 10 MG tablet Commonly known as: APRESOLINE       TAKE these medications    albuterol  108 (90 Base) MCG/ACT inhaler Commonly known as: VENTOLIN  HFA Inhale 2 puffs into the lungs every 6 (six) hours as needed for wheezing or shortness of breath.   alendronate 70 MG tablet Commonly known as: FOSAMAX Take 70 mg by mouth.   Besivance 0.6 % Susp Generic drug: Besifloxacin HCl Place  1 drop into the left eye 4 (four) times daily.   carvedilol  3.125 MG tablet Commonly known as: COREG  Take 3.125 mg by mouth 2 (two) times daily with a meal.   escitalopram  20 MG tablet Commonly known as: LEXAPRO  Take 20 mg by mouth.   furosemide  20 MG tablet Commonly known as: LASIX  Take 20 mg by mouth daily.   gabapentin  300 MG capsule Commonly known as: NEURONTIN  Take 300 mg by mouth at  bedtime.   gatifloxacin  0.5 % Soln Commonly known as: ZYMAXID  Place 1 drop into the left eye 4 (four) times daily.   lactobacillus acidophilus & bulgar chewable tablet Chew 1 tablet by mouth 3 (three) times daily with meals for 10 days.   levofloxacin  750 MG tablet Commonly known as: Levaquin  Take 1 tablet (750 mg total) by mouth daily for 4 days.   losartan  100 MG tablet Commonly known as: COZAAR  Take 100 mg by mouth daily.   melatonin 5 MG Tabs Take 1 tablet (5 mg total) by mouth at bedtime as needed.   pantoprazole  40 MG tablet Commonly known as: PROTONIX  Take 40 mg by mouth every morning.   rosuvastatin  10 MG tablet Commonly known as: CRESTOR  Take 10 mg by mouth daily.               Durable Medical Equipment  (From admission, onward)           Start     Ordered   11/13/23 1135  For home use only DME Bedside commode  Once       Question:  Patient needs a bedside commode to treat with the following condition  Answer:  Weakness   11/13/23 1134            Contact information for follow-up providers     [facility] Follow up.   Specialty: Home Health Services Why: Agency will call you to set up apt times Contact information: [address] [place] [address] [phone]         [facility] Follow up.   Why: Bedside commode Contact information: [place] [address] [phone]         Ann Graham. Go in 1 week(s).   Specialty: Family Medicine Why: 11/20/2023 @10 :30 A.M. Contact information: [place] [address] [phone]              Contact information for after-discharge care     Destination     [facility]erred [facility] .   Contact information: [address] [place]  [address] [phone]                    Discharge Exam: Ann Graham   11/11/23 1510 11/13/23  0428 11/14/23 0440  Weight: 118.4 kg 120 kg 118.9 kg        General:  AAO x 3,  cooperative, no distress;   HEENT:  Normocephalic, PERRL, otherwise with in Normal limits   Neuro:  CNII-XII intact. , normal motor and sensation, reflexes intact   Lungs:   Clear to auscultation BL, Respirations unlabored,  No wheezes / crackles  Cardio:    S1/S2, RRR, No murmure, No Rubs or Gallops   Abdomen:  Soft, non-tender, bowel sounds active all four quadrants, no guarding or peritoneal signs.  Muscular  skeletal:  Limited exam -global generalized weaknesses - in bed, able to move all 4 extremities,  2+ pulses,  symmetric, No pitting edema  Skin:  Dry, warm to touch, negative for any Rashes,  Wounds: Please see nursing documentation         Condition at discharge: fair  The results of significant diagnostics from this hospitalization (including imaging, microbiology, ancillary and laboratory) are listed below for reference.   Imaging Studies: US  Abdomen Limited RUQ (LIVER/GB) Result Date: 11/11/2023 CLINICAL DATA:  Jaundice EXAM: ULTRASOUND ABDOMEN LIMITED RIGHT UPPER QUADRANT COMPARISON:  CT abdomen pelvis 11/10/2023 FINDINGS: Gallbladder: Gallstones: None Sludge: None Gallbladder Wall: Within normal limits Pericholecystic fluid: None Sonographic Murphy's Sign: Negative per technologist Common bile duct: Diameter: 4 mm Liver: Parenchymal echogenicity: Normal Contours: Normal Lesions: None Portal vein: Patent.  Hepatopetal flow Other: None. IMPRESSION: No significant sonographic abnormality of the liver or gallbladder. Electronically Signed   By: Aliene Lloyd M.D.   On: 11/11/2023 07:48   CT Angio Chest PE W and/or Wo Contrast Result Date: 11/10/2023 CLINICAL DATA:  Sepsis.  Shortness of breath and weakness. EXAM: CT ANGIOGRAPHY CHEST CT ABDOMEN AND PELVIS WITH CONTRAST TECHNIQUE: Multidetector CT imaging of the chest was performed using the standard protocol during bolus administration of  intravenous contrast. Multiplanar CT image reconstructions and MIPs were obtained to evaluate the vascular anatomy. Multidetector CT imaging of the abdomen and pelvis was performed using the standard protocol during bolus administration of intravenous contrast. RADIATION DOSE REDUCTION: This exam was performed according to the departmental dose-optimization program which includes automated exposure control, adjustment of the mA and/or kV according to patient size and/or use of iterative reconstruction technique. CONTRAST:  OMNIPAQUE  IOHEXOL  350 MG/ML SOLN COMPARISON:  CT abdomen 04/16/2021 and chest radiograph 11/10/2023 FINDINGS: CTA CHEST FINDINGS Cardiovascular: No filling defect is identified in the pulmonary arterial tree to suggest pulmonary embolus. Atheromatous vascular calcification of the aortic arch and left anterior descending coronary artery. Moderate cardiomegaly. Mediastinum/Nodes: 0.9 cm right infrahilar lymph node. Additional scattered likely reactive lymph nodes in the right hilum right subcarinal region. Lungs/Pleura: Consolidation in much of the right lower lobe laterally suspicious for bacterial pneumonia process. No current cavitation. Bandlike density favoring atelectasis anteriorly in the left lower lobe. Trace right pleural effusion. Musculoskeletal: No significant findings Review of the MIP images confirms the above findings. CT ABDOMEN and PELVIS FINDINGS Hepatobiliary: Unremarkable Pancreas: Unremarkable Spleen: Unremarkable Adrenals/Urinary Tract: No significant findings Stomach/Bowel: Unremarkable.  Normal appendix. Vascular/Lymphatic: Atherosclerosis is present, including aortoiliac atherosclerotic disease. Upper normal size left external iliac nodes but similar to the 04/16/2021 exam. Reproductive: Unremarkable Other: No supplemental non-categorized findings. Musculoskeletal: Progressive collapse of the L1 vertebral body now with complete collapse centrally along with some middle  column involvement and up to 5 mm of chronic posterior retropulsion. The L1 vertebra demonstrated about 45% compression on the prior CT scan from 04/16/2021. 3 mm grade 1 degenerative anterolisthesis at L4-5. Loss of intervertebral disc height and disc desiccation at L5-S1. Review of the MIP images confirms the above findings. IMPRESSION: 1. No filling defect is identified in the pulmonary arterial tree to suggest pulmonary embolus. 2. Consolidation in much of the right lower lobe laterally suspicious for bacterial pneumonia process. No current cavitation. Reactive lymph nodes along the right hilar/infrahilar region. 3. Trace right pleural effusion. 4. Moderate cardiomegaly. 5. Progressive collapse of the L1 vertebral body now with complete collapse centrally along with some middle column involvement and up to 5 mm of chronic posterior retropulsion. The L1 vertebra demonstrated about 45% compression on the prior CT scan from 04/16/2021. 6.  Aortic Atherosclerosis (ICD10-I70.0). Electronically Signed   By: Ryan Salvage M.D.   On: 11/10/2023 21:26   CT ABDOMEN PELVIS W CONTRAST Result Date: 11/10/2023 CLINICAL DATA:  Sepsis.  Shortness of breath and weakness. EXAM: CT ANGIOGRAPHY CHEST CT ABDOMEN AND PELVIS WITH CONTRAST TECHNIQUE: Multidetector CT imaging of the chest was performed using the standard protocol during bolus administration of intravenous contrast. Multiplanar CT image reconstructions and MIPs were obtained to evaluate the vascular anatomy. Multidetector CT imaging of the abdomen and pelvis was performed using the standard protocol during bolus administration of intravenous contrast. RADIATION DOSE REDUCTION: This exam was performed according to the departmental dose-optimization program which includes automated exposure control, adjustment of the mA and/or kV according to patient size and/or use of iterative reconstruction technique. CONTRAST:  OMNIPAQUE  IOHEXOL  350 MG/ML SOLN COMPARISON:   CT abdomen 04/16/2021 and chest radiograph 11/10/2023 FINDINGS: CTA CHEST FINDINGS Cardiovascular: No filling defect is identified in the pulmonary arterial tree to suggest pulmonary embolus. Atheromatous vascular calcification of the aortic arch and left anterior descending coronary artery. Moderate cardiomegaly. Mediastinum/Nodes: 0.9 cm right infrahilar lymph node. Additional scattered likely reactive lymph nodes in the right hilum right subcarinal region. Lungs/Pleura: Consolidation in much of the right lower lobe laterally suspicious for bacterial pneumonia process. No current cavitation. Bandlike density favoring atelectasis anteriorly in the left lower lobe. Trace right pleural effusion. Musculoskeletal: No significant findings Review of the MIP images confirms the above findings. CT ABDOMEN and PELVIS FINDINGS Hepatobiliary: Unremarkable Pancreas: Unremarkable Spleen: Unremarkable Adrenals/Urinary Tract: No significant findings Stomach/Bowel: Unremarkable.  Normal appendix. Vascular/Lymphatic: Atherosclerosis is present, including aortoiliac atherosclerotic disease. Upper normal size left external iliac nodes but similar to the 04/16/2021 exam. Reproductive: Unremarkable Other: No supplemental non-categorized findings. Musculoskeletal: Progressive collapse of the L1 vertebral body now with complete collapse centrally along with some middle column involvement and up to 5 mm of chronic posterior retropulsion. The L1 vertebra demonstrated about 45% compression on the prior CT scan from 04/16/2021. 3 mm grade 1 degenerative anterolisthesis at L4-5. Loss of intervertebral disc height and disc desiccation at L5-S1. Review of the MIP images confirms the above findings. IMPRESSION: 1. No filling defect is identified in the pulmonary arterial tree to suggest pulmonary embolus. 2. Consolidation in much of the right lower lobe laterally suspicious for bacterial pneumonia process. No current cavitation. Reactive lymph nodes  along the right hilar/infrahilar region. 3. Trace right pleural effusion. 4. Moderate cardiomegaly. 5. Progressive collapse of the L1 vertebral body now with complete collapse centrally along with some middle column involvement and up to 5 mm of chronic posterior retropulsion. The L1 vertebra demonstrated about 45% compression on the prior CT scan from 04/16/2021. 6.  Aortic Atherosclerosis (ICD10-I70.0). Electronically Signed   By: Ryan Salvage M.D.   On: 11/10/2023 21:26   DG Chest 2 View Result Date: 11/10/2023 CLINICAL DATA:  sob EXAM: CHEST - 2 VIEW COMPARISON:  Chest x-ray 06/23/2020 FINDINGS: Limited evaluation due to overlapping osseous structures and overlying soft tissues. The heart and mediastinal contours are within normal limits. Bibasilar atelectasis. No focal consolidation. No pulmonary edema. Query bilateral pleural effusion. No pneumothorax. No acute osseous abnormality. IMPRESSION: Query bilateral pleural effusions. Limited evaluation due to overlapping osseous structures and overlying soft tissues. Electronically Signed   By: Morgane  Naveau M.D.   On: 11/10/2023 17:39    Microbiology: Results for orders placed or performed during the hospital encounter of 11/10/23  Resp panel by RT-PCR (RSV, Flu A&B, Covid) Anterior Nasal Swab  Status: None   Collection Time: 11/10/23  5:52 PM   Specimen: Anterior Nasal Swab  Result Value Ref Range Status   SARS Coronavirus 2 by RT PCR NEGATIVE NEGATIVE Final   Influenza A by PCR NEGATIVE NEGATIVE Final   Influenza B by PCR NEGATIVE NEGATIVE Final    Comment: (NOTE) The Xpert Xpress SARS-CoV-2/FLU/RSV plus assay is intended as an aid in the diagnosis of influenza from Nasopharyngeal swab specimens and should not be used as a sole basis for treatment. Nasal washings and aspirates are unacceptable for Xpert Xpress SARS-CoV-2/FLU/RSV testing.  Fact Sheet for Patients: BloggerCourse.com  Fact Sheet for  Healthcare Providers: SeriousBroker.it  This test is not yet approved or cleared by the [place]  FDA and has been authorized for detection and/or diagnosis of SARS-CoV-2 by FDA under an Emergency Use Authorization (EUA). This EUA will remain in effect (meaning this test can be used) for the duration of the COVID-19 declaration under Section 564(b)(1) of the Act, 21 U.S.C. section 360bbb-3(b)(1), unless the authorization is terminated or revoked.     Resp Syncytial Virus by PCR NEGATIVE NEGATIVE Final    Comment: (NOTE) Fact Sheet for Patients: BloggerCourse.com  Fact Sheet for Healthcare Providers: SeriousBroker.it  This test is not yet approved or cleared by the [place]  FDA and has been authorized for detection and/or diagnosis of SARS-CoV-2 by FDA under an Emergency Use Authorization (EUA). This EUA will remain in effect (meaning this test can be used) for the duration of the COVID-19 declaration under Section 564(b)(1) of the Act, 21 U.S.C. section 360bbb-3(b)(1), unless the authorization is terminated or revoked.  Performed at [facility], [address], [place], [place] [address]   Blood Culture (routine x 2)     Status: None (Preliminary result)   Collection Time: 11/10/23  5:52 PM   Specimen: BLOOD  Result Value Ref Range Status   Specimen Description BLOOD SITE NOT SPECIFIED  Final   Special Requests   Final    BOTTLES DRAWN AEROBIC AND ANAEROBIC Blood Culture adequate volume   Culture   Final    NO GROWTH 4 DAYS Performed at [facility], [address] [place], [place] [address]    Report Status PENDING  Incomplete  Blood Culture (routine x 2)     Status: None (Preliminary result)   Collection Time: 11/10/23  5:52 PM   Specimen: BLOOD  Result Value Ref Range Status   Specimen Description BLOOD BLOOD RIGHT ARM  Final   Special Requests   Final    BOTTLES DRAWN  AEROBIC AND ANAEROBIC Blood Culture adequate volume   Culture   Final    NO GROWTH 4 DAYS Performed at [facility], [address], [place], [place] [address]    Report Status PENDING  Incomplete  Urine Culture     Status: Abnormal   Collection Time: 11/10/23  9:22 PM   Specimen: Urine, Random  Result Value Ref Range Status   Specimen Description URINE, RANDOM  Final   Special Requests   Final    NONE Reflexed from [id number] Performed at [facility], [address] [place], [place] [address]    Culture >=100,000 COLONIES/mL KLEBSIELLA PNEUMONIAE (A)  Final   Report Status 11/12/2023 FINAL  Final   Organism ID, Bacteria KLEBSIELLA PNEUMONIAE (A)  Final      Susceptibility   Klebsiella pneumoniae - MIC*    AMPICILLIN >=32 RESISTANT Resistant     CEFAZOLIN (URINE)  Value in next row Sensitive      2 SENSITIVEThis is a modified FDA-approved test that has been validated and its performance characteristics determined by the reporting laboratory.  This laboratory is certified under the Clinical Laboratory Improvement Amendments CLIA as qualified to perform high complexity clinical laboratory testing.    CEFEPIME Value in next row Sensitive      2 SENSITIVEThis is a modified FDA-approved test that has been validated and its performance characteristics determined by the reporting laboratory.  This laboratory is certified under the Clinical Laboratory Improvement Amendments CLIA as qualified to perform high complexity clinical laboratory testing.    ERTAPENEM Value in next row Sensitive      2 SENSITIVEThis is a modified FDA-approved test that has been validated and its performance characteristics determined by the reporting laboratory.  This laboratory is certified under the Clinical Laboratory Improvement Amendments CLIA as qualified to perform high complexity clinical laboratory testing.    CEFTRIAXONE  Value in next row Sensitive      2 SENSITIVEThis is a modified FDA-approved test  that has been validated and its performance characteristics determined by the reporting laboratory.  This laboratory is certified under the Clinical Laboratory Improvement Amendments CLIA as qualified to perform high complexity clinical laboratory testing.    CIPROFLOXACIN Value in next row Sensitive      2 SENSITIVEThis is a modified FDA-approved test that has been validated and its performance characteristics determined by the reporting laboratory.  This laboratory is certified under the Clinical Laboratory Improvement Amendments CLIA as qualified to perform high complexity clinical laboratory testing.    GENTAMICIN Value in next row Sensitive      2 SENSITIVEThis is a modified FDA-approved test that has been validated and its performance characteristics determined by the reporting laboratory.  This laboratory is certified under the Clinical Laboratory Improvement Amendments CLIA as qualified to perform high complexity clinical laboratory testing.    NITROFURANTOIN Value in next row Resistant      2 SENSITIVEThis is a modified FDA-approved test that has been validated and its performance characteristics determined by the reporting laboratory.  This laboratory is certified under the Clinical Laboratory Improvement Amendments CLIA as qualified to perform high complexity clinical laboratory testing.    TRIMETH/SULFA Value in next row Sensitive      2 SENSITIVEThis is a modified FDA-approved test that has been validated and its performance characteristics determined by the reporting laboratory.  This laboratory is certified under the Clinical Laboratory Improvement Amendments CLIA as qualified to perform high complexity clinical laboratory testing.    AMPICILLIN/SULBACTAM Value in next row Sensitive      2 SENSITIVEThis is a modified FDA-approved test that has been validated and its performance characteristics determined by the reporting laboratory.  This laboratory is certified under the Clinical Laboratory  Improvement Amendments CLIA as qualified to perform high complexity clinical laboratory testing.    PIP/TAZO Value in next row Sensitive ug/mL     <=4 SENSITIVEThis is a modified FDA-approved test that has been validated and its performance characteristics determined by the reporting laboratory.  This laboratory is certified under the Clinical Laboratory Improvement Amendments CLIA as qualified to perform high complexity clinical laboratory testing.    MEROPENEM Value in next row Sensitive      <=4 SENSITIVEThis is a modified FDA-approved test that has been validated and its performance characteristics determined by the reporting laboratory.  This laboratory is certified under the Clinical Laboratory Improvement Amendments CLIA as qualified to perform high complexity  clinical laboratory testing.    * >=100,000 COLONIES/mL KLEBSIELLA PNEUMONIAE    Labs: CBC: Recent Labs  Lab 11/10/23 1627 11/10/23 1803 11/11/23 0613 11/12/23 0231 11/13/23 0216 11/14/23 0232  WBC 11.1*  --  8.2 7.0 8.2 8.0  HGB 11.6* 12.6 11.9* 10.0* 9.9* 9.3*  HCT 35.8* 37.0 36.5 31.1* 30.3* 28.9*  MCV 82.1  --  82.2 82.9 80.8 81.9  PLT 151  --  144* 158 138* 185   Basic Metabolic Panel: Recent Labs  Lab 11/10/23 1627 11/10/23 1752 11/10/23 1803 11/11/23 0613 11/12/23 0231 11/13/23 0216 11/14/23 0232  NA 127*  --  133* 134* 134* 133* 136  K 3.0*  --  3.5 3.4* 3.3* 3.9 3.3*  CL 93*  --   --  100 101 100 100  CO2 21*  --   --  24 23 22 25   GLUCOSE 119*  --   --  90 92 94 95  BUN 26*  --   --  25* 22 13 10   CREATININE 0.93  --   --  0.97 0.90 0.65 0.53  CALCIUM  8.2*  --   --  8.0* 7.9* 8.1* 8.1*  MG  --  2.0  --   --   --   --   --    Liver Function Tests: Recent Labs  Lab 11/10/23 1752 11/11/23 0613 11/12/23 0231 11/13/23 0216 11/14/23 0232  AST 42* 37 39 42* 43*  ALT 23 20 22 25 28   ALKPHOS 84 74 73 80 86  BILITOT 4.1* 2.8* 1.4* 1.2 0.9  PROT 7.2 6.3* 6.1* 6.1* 5.7*  ALBUMIN 3.0* 2.5* 2.2* 2.1*  1.9*   CBG: No results for input(s): GLUCAP in the last 168 hours.  Discharge time spent: greater than 45 minutes.  Signed: Concepcion Riser, MD [facility] 11/14/2023

## 2023-11-14 NOTE — Progress Notes (Signed)
 Left voicemail for daughter to pick up pat's BSC.

## 2023-11-14 NOTE — Plan of Care (Signed)
 Patient forgetful, needs reinforcement of education at facility.  Problem: Education: Goal: Knowledge of General Education information will improve Description: Including pain rating scale, medication(s)/side effects and non-pharmacologic comfort measures Outcome: Adequate for Discharge  Problem: Activity: Goal: Risk for activity intolerance will decrease Outcome: Adequate for Discharge Maximum assistance with mobility required, transfer to rehab facility.

## 2023-11-15 LAB — CULTURE, BLOOD (ROUTINE X 2)
Culture: NO GROWTH
Culture: NO GROWTH
Special Requests: ADEQUATE
Special Requests: ADEQUATE

## 2024-01-18 ENCOUNTER — Encounter: Payer: Self-pay | Admitting: Radiology

## 2024-02-10 ENCOUNTER — Ambulatory Visit: Admission: EM | Admit: 2024-02-10 | Discharge: 2024-02-10 | Disposition: A | Discharge status: Home / Self Care

## 2024-02-10 ENCOUNTER — Encounter: Payer: Self-pay | Admitting: *Deleted

## 2024-02-10 DIAGNOSIS — F331 Major depressive disorder, recurrent, moderate: Secondary | ICD-10-CM | POA: Insufficient documentation

## 2024-02-10 DIAGNOSIS — H3530 Unspecified macular degeneration: Secondary | ICD-10-CM | POA: Insufficient documentation

## 2024-02-10 DIAGNOSIS — J101 Influenza due to other identified influenza virus with other respiratory manifestations: Secondary | ICD-10-CM | POA: Diagnosis not present

## 2024-02-10 DIAGNOSIS — S32000A Wedge compression fracture of unspecified lumbar vertebra, initial encounter for closed fracture: Secondary | ICD-10-CM | POA: Insufficient documentation

## 2024-02-10 DIAGNOSIS — G4700 Insomnia, unspecified: Secondary | ICD-10-CM | POA: Insufficient documentation

## 2024-02-10 DIAGNOSIS — G629 Polyneuropathy, unspecified: Secondary | ICD-10-CM | POA: Insufficient documentation

## 2024-02-10 DIAGNOSIS — E559 Vitamin D deficiency, unspecified: Secondary | ICD-10-CM | POA: Insufficient documentation

## 2024-02-10 LAB — POC COVID19/FLU A&B COMBO
Covid Antigen, POC: NEGATIVE
Influenza A Antigen, POC: POSITIVE — AB
Influenza B Antigen, POC: NEGATIVE

## 2024-02-10 MED ORDER — PROMETHAZINE-DM 6.25-15 MG/5ML PO SYRP: 10.0000 mL | ORAL_SOLUTION | Freq: Three times a day (TID) | ORAL | 0 refills | Status: AC | PRN

## 2024-02-10 MED ORDER — IBUPROFEN 600 MG PO TABS: 600.0000 mg | ORAL_TABLET | Freq: Three times a day (TID) | ORAL | 0 refills | Status: AC | PRN

## 2024-02-10 MED ORDER — OSELTAMIVIR PHOSPHATE 75 MG PO CAPS: 75.0000 mg | ORAL_CAPSULE | Freq: Two times a day (BID) | ORAL | 0 refills | Status: AC

## 2024-02-10 NOTE — ED Provider Notes (Signed)
 [facility]  Note:  This document was prepared using Conservation officer, historic buildings and may include unintentional dictation errors.  MRN: [id number] DOB: June 10, 1955  Subjective:   Ann Graham is a 68 y.o. female presenting for evaluation of cough, mild shortness of breath, chest pain for the last couple of days.  Patient reports that she has shortness of breath at rest and worse with exertion.  Patient adds that she was admitted into long-term rehab in August for advanced pneumonia, was admitted for approximately 2 months.  Patient states that she has weakness and severe fatigue.  Has been using her inhaler but no other medications for symptoms.  Patient denies any known sick contacts or exposure anyone positive for COVID, flu.  Patient denies any fever, sore throat, respiratory distress, body aches, dizziness.  No current facility-administered medications for this encounter.  Current Outpatient Medications:    albuterol  (VENTOLIN  HFA) 108 (90 Base) MCG/ACT inhaler, Inhale 2 puffs into the lungs every 6 (six) hours as needed for wheezing or shortness of breath., Disp: 1 each, Rfl: 11   alendronate (FOSAMAX) 70 MG tablet, Take 70 mg by mouth., Disp: , Rfl:    dexamethasone (DECADRON) 0.1 % ophthalmic solution, SMARTSIG:1 Drop(s) 5 Times Daily, Disp: , Rfl:    escitalopram  (LEXAPRO ) 20 MG tablet, Take 20 mg by mouth., Disp: , Rfl:    furosemide  (LASIX ) 20 MG tablet, Take 20 mg by mouth daily., Disp: , Rfl:    gabapentin  (NEURONTIN ) 300 MG capsule, Take 300 mg by mouth at bedtime., Disp: , Rfl:    hydrALAZINE (APRESOLINE) 10 MG tablet, Take 10 mg by mouth., Disp: , Rfl:    ibuprofen  (ADVIL ) 600 MG tablet, Take 1 tablet (600 mg total) by mouth every 8 (eight) hours as needed., Disp: 30 tablet, Rfl: 0   losartan  (COZAAR ) 100 MG tablet, Take 100 mg by mouth daily., Disp: , Rfl:    MELATONIN MAXIMUM STRENGTH 5 MG TABS, Take 5 mg by mouth at bedtime., Disp: , Rfl:    oseltamivir   (TAMIFLU ) 75 MG capsule, Take 1 capsule (75 mg total) by mouth every 12 (twelve) hours., Disp: 10 capsule, Rfl: 0   pantoprazole  (PROTONIX ) 40 MG tablet, Take 40 mg by mouth every morning., Disp: , Rfl:    potassium chloride  (KLOR-CON ) 10 MEQ tablet, Take 10 mEq by mouth daily., Disp: , Rfl:    promethazine -dextromethorphan (PROMETHAZINE -DM) 6.25-15 MG/5ML syrup, Take 10 mLs by mouth 3 (three) times daily as needed., Disp: 240 mL, Rfl: 0   rosuvastatin  (CRESTOR ) 10 MG tablet, Take 10 mg by mouth daily., Disp: , Rfl:    Skin Protectants, Misc. (AQUAGARD HYDRATING) 41 % OINT, SMARTSIG:1 sparingly Topical 3 Times Daily PRN, Disp: , Rfl:    BESIVANCE 0.6 % SUSP, Place 1 drop into the left eye 4 (four) times daily., Disp: , Rfl:    carvedilol  (COREG ) 3.125 MG tablet, Take 3.125 mg by mouth 2 (two) times daily with a meal., Disp: , Rfl:    gatifloxacin  (ZYMAXID ) 0.5 % SOLN, Place 1 drop into the left eye 4 (four) times daily. (Patient not taking: Reported on 11/11/2023), Disp: , Rfl:    Allergies  Allergen Reactions   Hydrocodone-Acetaminophen  Rash   Naproxen Sodium Shortness Of Breath and Swelling   Amoxicillin Hives and Rash    yeast    Codeine  Hives   Hydrocodone Hives   Lisinopril  Swelling and Cough    Congestion, fluid build up   Other Hives  Powder coating **patient doesn't remember this**   Alendronate Sodium Diarrhea   Furosemide      Kidney pain   Naproxen Swelling   Hydrochlorothiazide Rash    Past Medical History:  Diagnosis Date   Anxiety    Asthma, chronic 11/11/2023   Budd-Chiari syndrome (HCC)    Elevated cholesterol    Hx of cardiovascular stress test    Lexiscan  Myoview 4/14:  No ischemia, EF 75%.   Hx of echocardiogram    Echo 3/14: EF 55-60%, Gr 1 DD, mild LAE   Hypertension      Past Surgical History:  Procedure Laterality Date   DILATION AND CURETTAGE OF UTERUS      Family History  Problem Relation Age of Onset   Heart disease Mother    Heart disease  Father    Colon cancer Father    Heart disease Brother    Breast cancer Neg Hx     Social History   Tobacco Use   Smoking status: Never   Smokeless tobacco: Never  Vaping Use   Vaping status: Never Used  Substance Use Topics   Alcohol use: Yes    Comment: wine occasional   Drug use: No    ROS Refer to HPI for ROS details.  Objective:   Vitals: BP (!) 168/76 (BP Location: Left Arm)   Pulse 83   Temp (!) 100.4 F (38 C) (Oral)   Resp 16   SpO2 94%   Physical Exam Vitals and nursing note reviewed.  Constitutional:      General: She is not in acute distress.    Appearance: She is well-developed. She is not ill-appearing or toxic-appearing.  HENT:     Head: Normocephalic and atraumatic.     Nose: Nose normal. No congestion or rhinorrhea.     Mouth/Throat:     Mouth: Mucous membranes are moist.     Pharynx: Oropharynx is clear.  Cardiovascular:     Rate and Rhythm: Normal rate. Rhythm irregular.  Pulmonary:     Effort: Pulmonary effort is normal. No respiratory distress.     Breath sounds: No stridor. No wheezing or rhonchi.  Skin:    General: Skin is warm and dry.  Neurological:     General: No focal deficit present.     Mental Status: She is alert and oriented to person, place, and time.  Psychiatric:        Mood and Affect: Mood normal.        Behavior: Behavior normal.     Procedures  Results for orders placed or performed during the hospital encounter of 02/10/24 (from the past 24 hours)  POC Covid19/Flu A&B Antigen     Status: Abnormal   Collection Time: 02/10/24  9:10 AM  Result Value Ref Range   Influenza A Antigen, POC Positive (A) Negative   Influenza B Antigen, POC Negative Negative   Covid Antigen, POC Negative Negative    No results found.   Assessment and Plan :     Discharge Instructions       1. Influenza A (Primary) - ED EKG completed in UC shows sinus rhythm with PVCs, mildly abnormal EKG, ventricular rate of 79 bpm, no sign  of ischemia, no STEMI. - POC Covid19/Flu A&B Antigen completed in UC is positive for influenza A, negative for influenza B and COVID. - oseltamivir  (TAMIFLU ) 75 MG capsule; Take 1 capsule (75 mg total) by mouth every 12 (twelve) hours.  Dispense: 10 capsule; Refill: 0 - promethazine -dextromethorphan (  PROMETHAZINE -DM) 6.25-15 MG/5ML syrup; Take 10 mLs by mouth 3 (three) times daily as needed.  Dispense: 240 mL; Refill: 0 - ibuprofen  (ADVIL ) 600 MG tablet; Take 1 tablet (600 mg total) by mouth every 8 (eight) hours as needed.  Dispense: 30 tablet; Refill: 0  -Continue to monitor symptoms for any change in severity if there is any escalation of current symptoms or development of new symptoms follow-up in ER for further evaluation and management.       Judiann Celia B Sema Stangler   Siren Porrata, [place], [place] 02/10/24 [phone]

## 2024-02-10 NOTE — Discharge Instructions (Addendum)
  1. Influenza A (Primary) - ED EKG completed in UC shows sinus rhythm with PVCs, mildly abnormal EKG, ventricular rate of 79 bpm, no sign of ischemia, no STEMI. - POC Covid19/Flu A&B Antigen completed in UC is positive for influenza A, negative for influenza B and COVID. - oseltamivir  (TAMIFLU ) 75 MG capsule; Take 1 capsule (75 mg total) by mouth every 12 (twelve) hours.  Dispense: 10 capsule; Refill: 0 - promethazine -dextromethorphan (PROMETHAZINE -DM) 6.25-15 MG/5ML syrup; Take 10 mLs by mouth 3 (three) times daily as needed.  Dispense: 240 mL; Refill: 0 - ibuprofen  (ADVIL ) 600 MG tablet; Take 1 tablet (600 mg total) by mouth every 8 (eight) hours as needed.  Dispense: 30 tablet; Refill: 0  -Continue to monitor symptoms for any change in severity if there is any escalation of current symptoms or development of new symptoms follow-up in ER for further evaluation and management.

## 2024-02-10 NOTE — ED Triage Notes (Signed)
 Pt reports she was admitted to the hospital with pneumonia in August. She recently left rehab. States a couple days ago I started coughing. Endorses Shob at rest, worse with exertion. She has been using her inhaler but no additional meds. Denies known fever.
